# Patient Record
Sex: Female | Born: 1990 | Race: White | Hispanic: No | Marital: Single | Smoking: Former smoker
Health system: Southern US, Community
[De-identification: ages and names within clinical notes are randomized; demographics above are authoritative.]

## PROBLEM LIST (undated history)

## (undated) ENCOUNTER — Inpatient Hospital Stay (HOSPITAL_COMMUNITY): Payer: Self-pay

## (undated) DIAGNOSIS — I341 Nonrheumatic mitral (valve) prolapse: Secondary | ICD-10-CM

## (undated) DIAGNOSIS — K829 Disease of gallbladder, unspecified: Secondary | ICD-10-CM

## (undated) DIAGNOSIS — N83209 Unspecified ovarian cyst, unspecified side: Secondary | ICD-10-CM

## (undated) DIAGNOSIS — G43909 Migraine, unspecified, not intractable, without status migrainosus: Secondary | ICD-10-CM

## (undated) DIAGNOSIS — K219 Gastro-esophageal reflux disease without esophagitis: Secondary | ICD-10-CM

## (undated) DIAGNOSIS — M1990 Unspecified osteoarthritis, unspecified site: Secondary | ICD-10-CM

## (undated) DIAGNOSIS — I480 Paroxysmal atrial fibrillation: Secondary | ICD-10-CM

## (undated) DIAGNOSIS — O008 Other ectopic pregnancy without intrauterine pregnancy: Secondary | ICD-10-CM

## (undated) DIAGNOSIS — O99340 Other mental disorders complicating pregnancy, unspecified trimester: Secondary | ICD-10-CM

## (undated) DIAGNOSIS — F32A Depression, unspecified: Secondary | ICD-10-CM

## (undated) DIAGNOSIS — L299 Pruritus, unspecified: Secondary | ICD-10-CM

## (undated) DIAGNOSIS — O021 Missed abortion: Secondary | ICD-10-CM

## (undated) DIAGNOSIS — K811 Chronic cholecystitis: Secondary | ICD-10-CM

## (undated) DIAGNOSIS — Z01818 Encounter for other preprocedural examination: Secondary | ICD-10-CM

## (undated) DIAGNOSIS — N921 Excessive and frequent menstruation with irregular cycle: Secondary | ICD-10-CM

## (undated) DIAGNOSIS — N926 Irregular menstruation, unspecified: Secondary | ICD-10-CM

## (undated) DIAGNOSIS — K828 Other specified diseases of gallbladder: Principal | ICD-10-CM

## (undated) DIAGNOSIS — Z3A11 11 weeks gestation of pregnancy: Secondary | ICD-10-CM

## (undated) DIAGNOSIS — O00209 Unspecified ovarian pregnancy without intrauterine pregnancy: Secondary | ICD-10-CM

## (undated) DIAGNOSIS — I499 Cardiac arrhythmia, unspecified: Secondary | ICD-10-CM

## (undated) DIAGNOSIS — R102 Pelvic and perineal pain: Secondary | ICD-10-CM

## (undated) DIAGNOSIS — O99713 Diseases of the skin and subcutaneous tissue complicating pregnancy, third trimester: Secondary | ICD-10-CM

## (undated) DIAGNOSIS — R1032 Left lower quadrant pain: Secondary | ICD-10-CM

## (undated) DIAGNOSIS — O09292 Supervision of pregnancy with other poor reproductive or obstetric history, second trimester: Secondary | ICD-10-CM

## (undated) HISTORY — DX: Migraine, unspecified, not intractable, without status migrainosus: G43.909

## (undated) HISTORY — DX: Unspecified osteoarthritis, unspecified site: M19.90

## (undated) HISTORY — DX: Gastro-esophageal reflux disease without esophagitis: K21.9

## (undated) HISTORY — PX: CLOSED REDUCTION ELBOW DISLOCATION: SUR215

---
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
## 2012-01-13 ENCOUNTER — Inpatient Hospital Stay (HOSPITAL_COMMUNITY)
Admission: AD | Admit: 2012-01-13 | Discharge: 2012-01-13 | Disposition: A | Discharge status: Home / Self Care | Payer: Medicaid Other | Source: Ambulatory Visit | Attending: Obstetrics & Gynecology | Admitting: Obstetrics & Gynecology

## 2012-01-13 ENCOUNTER — Encounter (HOSPITAL_COMMUNITY): Payer: Self-pay | Admitting: *Deleted

## 2012-01-13 ENCOUNTER — Encounter (HOSPITAL_COMMUNITY)
Admission: RE | Admit: 2012-01-13 | Discharge: 2012-01-13 | Disposition: A | Discharge status: Home / Self Care | Payer: Medicaid Other | Source: Ambulatory Visit | Attending: Emergency Medicine | Admitting: Emergency Medicine

## 2012-01-13 DIAGNOSIS — R109 Unspecified abdominal pain: Secondary | ICD-10-CM | POA: Insufficient documentation

## 2012-01-13 DIAGNOSIS — N390 Urinary tract infection, site not specified: Secondary | ICD-10-CM | POA: Insufficient documentation

## 2012-01-13 DIAGNOSIS — R300 Dysuria: Secondary | ICD-10-CM | POA: Insufficient documentation

## 2012-01-13 LAB — URINE MICROSCOPIC-ADD ON

## 2012-01-13 LAB — URINALYSIS, ROUTINE W REFLEX MICROSCOPIC
Ketones, ur: NEGATIVE mg/dL
Nitrite: NEGATIVE
Specific Gravity, Urine: >1.03 — ABNORMAL HIGH (ref 1.005–1.030)
Urobilinogen, UA: 1 mg/dL (ref 0.0–1.0)

## 2012-01-13 LAB — WET PREP, GENITAL: WBC, Wet Prep HPF POC: NONE SEEN

## 2012-01-13 LAB — POCT PREGNANCY, URINE: Preg Test, Ur: NEGATIVE

## 2012-01-13 MED ORDER — PHENAZOPYRIDINE HCL 100 MG PO TABS
200.0000 mg | ORAL_TABLET | Freq: Once | ORAL | Status: AC
  Administered 2012-01-13: 200 mg via ORAL
  Filled 2012-01-13: qty 2

## 2012-01-13 MED ORDER — SINCALIDE 5 MCG IJ SOLR: 0.0400 ug/kg | Freq: Once | INTRAMUSCULAR | Status: DC

## 2012-01-13 MED ORDER — TECHNETIUM TC 99M MEBROFENIN IV KIT
5.0000 | PACK | Freq: Once | INTRAVENOUS | Status: AC | PRN
  Administered 2012-01-13: 5 via INTRAVENOUS

## 2012-01-13 MED ORDER — NITROFURANTOIN MONOHYD MACRO 100 MG PO CAPS
100.0000 mg | ORAL_CAPSULE | Freq: Once | ORAL | Status: AC
  Administered 2012-01-13: 100 mg via ORAL
  Filled 2012-01-13: qty 1

## 2012-01-13 MED ORDER — SINCALIDE 5 MCG IJ SOLR
INTRAMUSCULAR | Status: AC
  Administered 2012-01-13: 3.8 ug via INTRAVENOUS
  Filled 2012-01-13: qty 5

## 2012-01-13 MED ORDER — SINCALIDE 5 MCG IJ SOLR
INTRAMUSCULAR | Status: AC
  Filled 2012-01-13: qty 5

## 2012-01-13 MED ORDER — NITROFURANTOIN MONOHYD MACRO 100 MG PO CAPS: 100.0000 mg | ORAL_CAPSULE | Freq: Two times a day (BID) | ORAL | Status: AC

## 2012-01-13 NOTE — MAU Provider Note (Signed)
History     CSN: [id number]  Arrival date and time: 01/13/12 1628   First Provider Initiated Contact with Patient 01/13/12 1739      Chief Complaint  Patient presents with  . Dysuria   HPI Laurie [name] is 21 y.o. G0P0000 Unknown weeks presenting with frequency of urination with ? Blood in it.  She also is concerned she has an STD.  She is not pregnant    Past Medical History  Diagnosis Date  . Asthma   . Ovarian cyst   . MVP (mitral valve prolapse)     Past Surgical History  Procedure Date  . Appendectomy     Family History  Problem Relation Age of Onset  . Anesthesia problems Neg Hx   . Hypertension Maternal Aunt   . Hypertension Paternal Aunt   . Hypertension Maternal Grandmother   . Drug abuse Maternal Grandmother   . Hypertension Maternal Grandfather   . Drug abuse Maternal Grandfather   . Hypertension Paternal Grandmother   . Drug abuse Paternal Grandmother   . Hypertension Paternal Grandfather   . Drug abuse Paternal Grandfather     History  Substance Use Topics  . Smoking status: Never Smoker   . Smokeless tobacco: Never Used  . Alcohol Use: No    Allergies: No Known Allergies  Prescriptions prior to admission  Medication Sig Dispense Refill  . albuterol (PROVENTIL HFA;VENTOLIN HFA) 108 (90 BASE) MCG/ACT inhaler Inhale 2 puffs into the lungs every 6 (six) hours as needed. Shortness of breath/asthma       . phentermine 37.5 MG capsule Take 37.5 mg by mouth every morning.        Review of Systems  Constitutional: Negative.   Gastrointestinal: Positive for abdominal pain (lower mid abdominal discomfort). Negative for nausea and vomiting.  Genitourinary: Positive for dysuria, frequency and hematuria.       + for vaginal discharge   Physical Exam   Blood pressure 113/70, pulse 91, temperature 97.1 F (36.2 C), temperature source Oral, resp. rate 18, height 5' 6.5" (1.689 m), weight 78.382 kg (172 lb 12.8 oz), last menstrual period 01/04/2012,  SpO2 100.00%.  Physical Exam  Constitutional: She is oriented to person, place, and time. She appears well-developed and well-nourished. No distress.  HENT:  Head: Normocephalic.  Neck: Normal range of motion.  Cardiovascular: Normal rate.   Respiratory: Effort normal.  GI: Soft. There is tenderness (mild tenderness -suprapubic).  Genitourinary: Uterus is not enlarged and not tender. Cervix exhibits no discharge and no friability. Right adnexum displays no mass, no tenderness and no fullness. Left adnexum displays no mass, no tenderness and no fullness. No bleeding around the vagina. Vaginal discharge (small amount of creamy discharge without odor) found.  Neurological: She is alert and oriented to person, place, and time.  Skin: Skin is warm and dry.  Psychiatric: She has a normal mood and affect. Her behavior is normal.   Results for orders placed during the hospital encounter of 01/13/12 (from the past 24 hour(s))  URINALYSIS, ROUTINE W REFLEX MICROSCOPIC     Status: Abnormal   Collection Time   01/13/12  4:50 PM      Component Value Range   Color, Urine YELLOW  YELLOW    APPearance CLOUDY (*) CLEAR    Specific Gravity, Urine >1.030 (*) 1.005 - 1.030    pH 5.5  5.0 - 8.0    Glucose, UA NEGATIVE  NEGATIVE (mg/dL)   Hgb urine dipstick LARGE (*) NEGATIVE  Bilirubin Urine SMALL (*) NEGATIVE    Ketones, ur NEGATIVE  NEGATIVE (mg/dL)   Protein, ur 829 (*) NEGATIVE (mg/dL)   Urobilinogen, UA 1.0  0.0 - 1.0 (mg/dL)   Nitrite NEGATIVE  NEGATIVE    Leukocytes, UA SMALL (*) NEGATIVE   URINE MICROSCOPIC-ADD ON     Status: Abnormal   Collection Time   01/13/12  4:50 PM      Component Value Range   Squamous Epithelial / LPF MANY (*) RARE    WBC, UA TOO NUMEROUS TO COUNT  <3 (WBC/hpf)   RBC / HPF 21-50  <3 (RBC/hpf)  POCT PREGNANCY, URINE     Status: Normal   Collection Time   01/13/12  4:57 PM      Component Value Range   Preg Test, Ur NEGATIVE  NEGATIVE   WET PREP, GENITAL     Status:  Normal   Collection Time   01/13/12  5:45 PM      Component Value Range   Yeast Wet Prep HPF POC NONE SEEN  NONE SEEN    Trich, Wet Prep NONE SEEN  NONE SEEN    Clue Cells Wet Prep HPF POC NONE SEEN  NONE SEEN    WBC, Wet Prep HPF POC NONE SEEN  NONE SEEN     PHONE CALL FROM LAB WITH CORRECTED WET PREP WITH CLUES-FEW MAU Course  Procedures GC/CHl culture to lab  MDM Pyridium 200mg  and Macrobid 100mg  po given in MAU  Assessment and Plan  A: Urinary Tract Infection     Screen for STI  P: Rx for Macrobid     Instructed to by Azo over the counter for bladder discomfort     Increase po fluids.  Keiandre Cygan,EVE M 01/13/2012, 5:47 PM

## 2012-01-13 NOTE — Discharge Instructions (Signed)
Urinary Tract Infection A urinary tract infection (UTI) is often caused by a germ (bacteria). A UTI is usually helped with medicine (antibiotics) that kills germs. Take all the medicine until it is gone. Do this even if you are feeling better. You are usually better in 7 to 10 days. HOME CARE   Drink enough water and fluids to keep your pee (urine) clear or pale yellow. Drink:   Cranberry juice.   Water.   Avoid:   Caffeine.   Tea.   Bubbly (carbonated) drinks.   Alcohol.   Only take medicine as told by your doctor.   To prevent further infections:   Pee often.   After pooping (bowel movement), women should wipe from front to back. Use each tissue only once.   Pee before and after having sex (intercourse).  Ask your doctor when your test results will be ready. Make sure you follow up and get your test results.  GET HELP RIGHT AWAY IF:   There is very bad back pain or lower belly (abdominal) pain.   You get the chills.   You have a fever.   Your baby is older than 3 months with a rectal temperature of 102 F (38.9 C) or higher.   Your baby is 3 months old or younger with a rectal temperature of 100.4 F (38 C) or higher.   You feel sick to your stomach (nauseous) or throw up (vomit).   There is continued burning with peeing.   Your problems are not better in 3 days. Return sooner if you are getting worse.  MAKE SURE YOU:   Understand these instructions.   Will watch your condition.   Will get help right away if you are not doing well or get worse.  Document Released: 02/09/2008 Document Revised: 08/12/2011 Document Reviewed: 02/09/2008 [employer] Patient Information 2012 [employer]. 

## 2012-01-13 NOTE — MAU Provider Note (Signed)
Medical Screening exam and patient care preformed by advanced practice provider.  Agree with the above management.  

## 2012-01-13 NOTE — MAU Note (Signed)
Patient states she has a history of recurrent bladder injections. Has been having urinary frequency with only a little urine out and some abdominal pain. States her partner has had other partners and wants to be checked for STD's.

## 2012-01-13 NOTE — MAU Note (Signed)
Worried about having some type of STD;

## 2012-01-14 LAB — GC/CHLAMYDIA PROBE AMP, GENITAL
Chlamydia, DNA Probe: NEGATIVE
GC Probe Amp, Genital: NEGATIVE

## 2012-01-24 ENCOUNTER — Emergency Department (HOSPITAL_COMMUNITY)
Admission: EM | Admit: 2012-01-24 | Discharge: 2012-01-25 | Disposition: A | Discharge status: Home / Self Care | Payer: Medicaid Other | Attending: Emergency Medicine | Admitting: Emergency Medicine

## 2012-01-24 ENCOUNTER — Encounter (HOSPITAL_COMMUNITY): Payer: Self-pay | Admitting: Emergency Medicine

## 2012-01-24 DIAGNOSIS — K5900 Constipation, unspecified: Secondary | ICD-10-CM | POA: Insufficient documentation

## 2012-01-24 DIAGNOSIS — R109 Unspecified abdominal pain: Secondary | ICD-10-CM | POA: Insufficient documentation

## 2012-01-24 DIAGNOSIS — R5381 Other malaise: Secondary | ICD-10-CM | POA: Insufficient documentation

## 2012-01-24 DIAGNOSIS — R112 Nausea with vomiting, unspecified: Secondary | ICD-10-CM | POA: Insufficient documentation

## 2012-01-24 DIAGNOSIS — J45909 Unspecified asthma, uncomplicated: Secondary | ICD-10-CM | POA: Insufficient documentation

## 2012-01-24 DIAGNOSIS — R10819 Abdominal tenderness, unspecified site: Secondary | ICD-10-CM | POA: Insufficient documentation

## 2012-01-24 DIAGNOSIS — R5383 Other fatigue: Secondary | ICD-10-CM | POA: Insufficient documentation

## 2012-01-24 LAB — URINE MICROSCOPIC-ADD ON

## 2012-01-24 LAB — LIPASE, BLOOD: Lipase: 20 U/L (ref 11–59)

## 2012-01-24 LAB — DIFFERENTIAL
Basophils Absolute: 0 10*3/uL (ref 0.0–0.1)
Basophils Relative: 0 % (ref 0–1)
Eosinophils Absolute: 0.1 10*3/uL (ref 0.0–0.7)
Monocytes Absolute: 0.3 10*3/uL (ref 0.1–1.0)
Neutro Abs: 5.1 10*3/uL (ref 1.7–7.7)
Neutrophils Relative %: 67 % (ref 43–77)

## 2012-01-24 LAB — COMPREHENSIVE METABOLIC PANEL
AST: 14 U/L (ref 0–37)
Albumin: 4.3 g/dL (ref 3.5–5.2)
Chloride: 101 mEq/L (ref 96–112)
Creatinine, Ser: 0.91 mg/dL (ref 0.50–1.10)
Potassium: 3.6 mEq/L (ref 3.5–5.1)
Total Bilirubin: 0.2 mg/dL — ABNORMAL LOW (ref 0.3–1.2)
Total Protein: 7.6 g/dL (ref 6.0–8.3)

## 2012-01-24 LAB — URINALYSIS, ROUTINE W REFLEX MICROSCOPIC
Ketones, ur: NEGATIVE mg/dL
Nitrite: NEGATIVE
Protein, ur: NEGATIVE mg/dL
Urobilinogen, UA: 1 mg/dL (ref 0.0–1.0)

## 2012-01-24 LAB — CBC
MCHC: 35 g/dL (ref 30.0–36.0)
RDW: 12 % (ref 11.5–15.5)

## 2012-01-24 MED ORDER — ONDANSETRON HCL 4 MG/2ML IJ SOLN
4.0000 mg | Freq: Once | INTRAMUSCULAR | Status: AC
  Administered 2012-01-24: 4 mg via INTRAVENOUS
  Filled 2012-01-24: qty 2

## 2012-01-24 MED ORDER — ONDANSETRON 8 MG PO TBDP: 8.0000 mg | ORAL_TABLET | Freq: Three times a day (TID) | ORAL | Status: AC | PRN

## 2012-01-24 MED ORDER — HYDROMORPHONE HCL PF 1 MG/ML IJ SOLN
1.0000 mg | Freq: Once | INTRAMUSCULAR | Status: AC
  Administered 2012-01-24: 1 mg via INTRAVENOUS

## 2012-01-24 MED ORDER — HYDROMORPHONE HCL PF 1 MG/ML IJ SOLN
1.0000 mg | Freq: Once | INTRAMUSCULAR | Status: AC
  Administered 2012-01-24: 1 mg via INTRAVENOUS
  Filled 2012-01-24: qty 1

## 2012-01-24 MED ORDER — OXYCODONE-ACETAMINOPHEN 5-325 MG PO TABS: 1.0000 | ORAL_TABLET | Freq: Four times a day (QID) | ORAL | Status: AC | PRN

## 2012-01-24 MED ORDER — SODIUM CHLORIDE 0.9 % IV BOLUS (SEPSIS)
1000.0000 mL | Freq: Once | INTRAVENOUS | Status: AC
  Administered 2012-01-24: 1000 mL via INTRAVENOUS

## 2012-01-24 MED ORDER — HYDROMORPHONE HCL PF 1 MG/ML IJ SOLN
INTRAMUSCULAR | Status: AC
  Administered 2012-01-24: 1 mg via INTRAVENOUS
  Filled 2012-01-24: qty 1

## 2012-01-24 NOTE — Discharge Instructions (Signed)
Please read and follow all provided instructions.  Your diagnoses today include:  1. Abdominal pain     Tests performed today include:  Blood counts and electrolytes  Blood tests to check liver and kidney function  Blood tests to check pancreas function  Urine test to look for infection and pregnancy (in women)  Vital signs. See below for your results today.   Medications prescribed:   Percocet (oxycodone/acetaminophen) - narcotic pain medication  You have been prescribed narcotic pain medication such as Vicodin or Percocet: DO NOT drive or perform any activities that require you to be awake and alert because this medicine can make you drowsy. BE VERY CAREFUL not to take multiple medicines containing Tylenol (also called acetaminophen). Doing so can lead to an overdose which can damage your liver and cause liver failure and possibly death.    Zofran (ondansetron) - for nausea and vomiting  Take any prescribed medications only as directed.  Home care instructions:   Follow any educational materials contained in this packet.  Follow-up instructions: Please follow-up with your primary care provider in the next 2 days for further evaluation of your symptoms. If you do not have a primary care doctor -- see below for referral information.   Call the stomach doctor referral and get an appointment to be seen for your symptoms.   Return instructions:  SEEK IMMEDIATE MEDICAL ATTENTION IF:  The pain does not go away or becomes severe   A temperature above 101F develops   Repeated vomiting occurs (multiple episodes)   The pain becomes localized to portions of the abdomen. The right side could possibly be appendicitis. In an adult, the left lower portion of the abdomen could be colitis or diverticulitis.   Blood is being passed in stools or vomit (bright red or black tarry stools)   You develop chest pain, difficulty breathing, dizziness or fainting, or become confused, poorly  responsive, or inconsolable (young children)  If you have any other emergent concerns regarding your health  Additional Information: Abdominal (belly) pain can be caused by many things. Your caregiver performed an examination and possibly ordered blood/urine tests and imaging (CT scan, x-rays, ultrasound). Many cases can be observed and treated at home after initial evaluation in the emergency department. Even though you are being discharged home, abdominal pain can be unpredictable. Therefore, you need a repeated exam if your pain does not resolve, returns, or worsens. Most patients with abdominal pain don't have to be admitted to the hospital or have surgery, but serious problems like appendicitis and gallbladder attacks can start out as nonspecific pain. Many abdominal conditions cannot be diagnosed in one visit, so follow-up evaluations are very important.  Your vital signs today were: BP 110/62  Pulse 76  Temp(Src) 98 F (36.7 C) (Oral)  Resp 16  Wt 164 lb 4 oz (74.503 kg)  SpO2 100%  LMP 01/20/2012 If your blood pressure (bp) was elevated above 135/85 this visit, please have this repeated by your doctor within one month. -------------- No Primary Care Doctor Call [facility]  [phone] Other agencies that provide inexpensive medical care    [facility]  [phone]    [facility]  [phone]    [facility]  [phone]    [facility]  [phone]    [facility]  [phone]    [facility]  [phone] -------------- RESOURCE GUIDE:  Dental Problems  Patients with Medicaid: [facility]  [facility] [address]                                            [phone] [address] [employer] Phone:  [phone]                                                      Phone:  [phone]  If unable to pay or uninsured, contact:  [facility] or [facility]. to become qualified for the adult  dental clinic.  Chronic Pain Problems Contact [facility]  [phone] Patients need to be referred by their primary care doctor.  Insufficient Money for Medicine Contact [employer]:  call "211" or [facility] [phone].  Psychological Services [facility]  [phone] [facility]  [phone] [facility]   [phone] (emergency services [phone])  Substance Abuse [facility]  [phone] [facility] [phone] [facility] [phone] Floydene Flock [phone] Residential & Outpatient Substance Abuse Program  [phone]  Abuse/Neglect [facility] Child Abuse Hotline [phone] [facility] Child Abuse Hotline [phone] (After Hours)  Emergency Shelter [facility] [phone]  Maternity Homes [facility] at the [place] [facility] [phone] [facility] [phone]  [facility]  [facility]     [employer]                          [facility] [address] [place]                       [address]      [address]  Blondell Reveal Phone:  [phone]                                   Phone:  [phone]                 Phone:  [phone]  [facility] Phone:  [phone]  [facility] Child Abuse Hotline [phone] [phone] (After Hours)

## 2012-01-24 NOTE — ED Notes (Signed)
For two months, has had abd pain, and hasn't been able to "Keep anything down" said was seen at [facility] for same and was told had "sludge" in gallbladder.

## 2012-01-24 NOTE — ED Provider Notes (Signed)
History     CSN: [id number]  Arrival date & time 01/24/12  1552   First MD Initiated Contact with Patient 01/24/12 2004      Chief Complaint  Patient presents with  . Abdominal Pain  . Weakness    (Consider location/radiation/quality/duration/timing/severity/associated sxs/prior treatment) HPI Comments: Patient presents with ongoing upper right quadrant abdominal pain and vomiting for 2 months. It has been worse in the past several days. Patient has had an ultrasound which showed sludge and a followup scan which was normal. Patient has been using Zofran and Phenergan at home without relief. She uses Tylenol for pain without much relief. Pain is sharp and intermittent. It is associated with vomiting. Pain does not radiate. Nothing makes the symptoms better. Palpation makes the symptoms worse. She denies fever, upper respiratory tract infection symptoms, chest pain, shortness of breath, urinary symptoms. She states that she has been constipated and is using stool softeners for this.  Patient is a 21 y.o. female presenting with abdominal pain and weakness. The history is provided by the patient and a parent.  Abdominal Pain The primary symptoms of the illness include abdominal pain, fatigue, nausea and vomiting. The primary symptoms of the illness do not include fever, shortness of breath, diarrhea, dysuria, vaginal discharge or vaginal bleeding. The current episode started more than 2 days ago. The problem has not changed since onset. The patient states that she believes she is currently not pregnant. Additional symptoms associated with the illness include constipation. Symptoms associated with the illness do not include heartburn, hematuria, frequency or back pain.  Weakness The primary symptoms include nausea and vomiting. Primary symptoms do not include headaches or fever.    Past Medical History  Diagnosis Date  . Asthma   . Ovarian cyst   . MVP (mitral valve prolapse)     Past  Surgical History  Procedure Date  . Appendectomy     Family History  Problem Relation Age of Onset  . Anesthesia problems Neg Hx   . Hypertension Maternal Aunt   . Hypertension Paternal Aunt   . Hypertension Maternal Grandmother   . Drug abuse Maternal Grandmother   . Hypertension Maternal Grandfather   . Drug abuse Maternal Grandfather   . Hypertension Paternal Grandmother   . Drug abuse Paternal Grandmother   . Hypertension Paternal Grandfather   . Drug abuse Paternal Grandfather     History  Substance Use Topics  . Smoking status: Never Smoker   . Smokeless tobacco: Never Used  . Alcohol Use: No    OB History    Grav Para Term Preterm Abortions TAB SAB Ect Mult Living   0 0 0 0 0 0 0 0 0 0       Review of Systems  Constitutional: Positive for fatigue. Negative for fever.  HENT: Negative for sore throat and rhinorrhea.   Eyes: Negative for redness.  Respiratory: Negative for cough and shortness of breath.   Cardiovascular: Negative for chest pain.  Gastrointestinal: Positive for nausea, vomiting, abdominal pain and constipation. Negative for heartburn, diarrhea and blood in stool.  Genitourinary: Negative for dysuria, frequency, hematuria, vaginal bleeding and vaginal discharge.  Musculoskeletal: Negative for myalgias and back pain.  Skin: Negative for rash.  Neurological: Negative for headaches.    Allergies  Review of patient's allergies indicates no known allergies.  Home Medications   Current Outpatient Rx  Name Route Sig Dispense Refill  . ACETAMINOPHEN 500 MG PO TABS Oral Take 500 mg by mouth every 6 (  six) hours as needed. Pain    . PHENTERMINE HCL 37.5 MG PO CAPS Oral Take 37.5 mg by mouth every morning.    Marland Kitchen NITROFURANTOIN MONOHYD MACRO 100 MG PO CAPS Oral Take 1 capsule (100 mg total) by mouth 2 (two) times daily. 14 capsule 0    BP 110/62  Pulse 76  Temp(Src) 98 F (36.7 C) (Oral)  Resp 16  Wt 164 lb 4 oz (74.503 kg)  SpO2 100%  LMP  01/20/2012  Physical Exam  Nursing note and vitals reviewed. Constitutional: She is oriented to person, place, and time. She appears well-developed and well-nourished.  HENT:  Head: Normocephalic and atraumatic.  Eyes: Conjunctivae are normal. Right eye exhibits no discharge. Left eye exhibits no discharge.  Neck: Normal range of motion. Neck supple.  Cardiovascular: Normal rate, regular rhythm and normal heart sounds.   Pulmonary/Chest: Effort normal and breath sounds normal.  Abdominal: Soft. There is tenderness in the right upper quadrant and epigastric area. There is no rigidity, no rebound, no guarding, no CVA tenderness, no tenderness at McBurney's point and negative Murphy's sign.    Neurological: She is alert and oriented to person, place, and time.  Skin: Skin is warm and dry.  Psychiatric: She has a normal mood and affect.    ED Course  Procedures (including critical care time)  Labs Reviewed  URINALYSIS, ROUTINE W REFLEX MICROSCOPIC - Abnormal; Notable for the following:    APPearance CLOUDY (*)    Leukocytes, UA MODERATE (*)    All other components within normal limits  COMPREHENSIVE METABOLIC PANEL - Abnormal; Notable for the following:    Glucose, Bld 119 (*)    Total Bilirubin 0.2 (*)    All other components within normal limits  URINE MICROSCOPIC-ADD ON - Abnormal; Notable for the following:    Bacteria, UA MANY (*)    All other components within normal limits  CBC  DIFFERENTIAL  POCT PREGNANCY, URINE  URINE CULTURE  LIPASE, BLOOD  LAB REPORT - SCANNED   No results found.   1. Abdominal pain     8:32 PM Patient seen and examined. Work-up reviewed. Lipase ordered. Discussed results with patient and mother. Medications ordered. Patient will need GI referral.   Vital signs reviewed and are as follows: Filed Vitals:   01/24/12 1943  BP: 110/62  Pulse: 76  Temp: 98 F (36.7 C)  Resp: 16   9:46 PM Pain medicine helped, requesting another dose.  Patient tolerating fluids in room.   The patient was urged to return to the Emergency Department immediately with worsening of current symptoms, worsening abdominal pain, persistent vomiting, blood noted in stools, fever, or any other concerns. The patient verbalized understanding.   Patient counseled on use of narcotic pain medications. Counseled not to combine these medications with others containing tylenol. Urged not to drink alcohol, drive, or perform any other activities that requires focus while taking these medications. The patient verbalizes understanding and agrees with the plan.  MDM  Allergies reassuring. At this point I do not think there is any need for repeat ultrasound. I do not suspect cholecystitis. Will treat symptoms tonight. Will give GI referral for further evaluation of ongoing symptoms.        [place], [place] 01/27/12 [phone]

## 2012-01-25 LAB — URINE CULTURE: Colony Count: 6000

## 2012-01-28 NOTE — ED Provider Notes (Signed)
Medical screening examination/treatment/procedure(s) were performed by non-physician practitioner and as supervising physician I was immediately available for consultation/collaboration.   Kiari Hosmer R Imari Sivertsen, MD 01/28/12 1558 

## 2012-02-02 ENCOUNTER — Other Ambulatory Visit: Payer: Self-pay | Admitting: Gastroenterology

## 2012-04-10 ENCOUNTER — Emergency Department (HOSPITAL_COMMUNITY)
Admission: EM | Admit: 2012-04-10 | Discharge: 2012-04-11 | Disposition: A | Discharge status: Home / Self Care | Payer: Medicaid Other | Attending: Emergency Medicine | Admitting: Emergency Medicine

## 2012-04-10 ENCOUNTER — Emergency Department (HOSPITAL_COMMUNITY): Payer: Medicaid Other

## 2012-04-10 ENCOUNTER — Encounter (HOSPITAL_COMMUNITY): Payer: Self-pay | Admitting: *Deleted

## 2012-04-10 DIAGNOSIS — R109 Unspecified abdominal pain: Secondary | ICD-10-CM

## 2012-04-10 DIAGNOSIS — R1011 Right upper quadrant pain: Secondary | ICD-10-CM | POA: Insufficient documentation

## 2012-04-10 DIAGNOSIS — R112 Nausea with vomiting, unspecified: Secondary | ICD-10-CM | POA: Insufficient documentation

## 2012-04-10 HISTORY — DX: Disease of gallbladder, unspecified: K82.9

## 2012-04-10 LAB — URINALYSIS, ROUTINE W REFLEX MICROSCOPIC
Bilirubin Urine: NEGATIVE
Glucose, UA: NEGATIVE mg/dL
Hgb urine dipstick: NEGATIVE
Specific Gravity, Urine: 1.006 (ref 1.005–1.030)
Urobilinogen, UA: 0.2 mg/dL (ref 0.0–1.0)
pH: 6.5 (ref 5.0–8.0)

## 2012-04-10 LAB — COMPREHENSIVE METABOLIC PANEL
ALT: 14 U/L (ref 0–35)
AST: 16 U/L (ref 0–37)
Albumin: 4.1 g/dL (ref 3.5–5.2)
Alkaline Phosphatase: 54 U/L (ref 39–117)
Calcium: 9.6 mg/dL (ref 8.4–10.5)
GFR calc Af Amer: >90 mL/min (ref >90)
Glucose, Bld: 110 mg/dL — ABNORMAL HIGH (ref 70–99)
Potassium: 3.9 mEq/L (ref 3.5–5.1)
Sodium: 140 mEq/L (ref 135–145)
Total Protein: 7.9 g/dL (ref 6.0–8.3)

## 2012-04-10 LAB — POCT PREGNANCY, URINE: Preg Test, Ur: NEGATIVE

## 2012-04-10 LAB — CBC
Hemoglobin: 13.1 g/dL (ref 12.0–15.0)
MCH: 31.8 pg (ref 26.0–34.0)
MCHC: 35 g/dL (ref 30.0–36.0)
Platelets: 267 10*3/uL (ref 150–400)

## 2012-04-10 MED ORDER — HYDROMORPHONE HCL PF 1 MG/ML IJ SOLN
1.0000 mg | Freq: Once | INTRAMUSCULAR | Status: AC
  Administered 2012-04-10: 1 mg via INTRAVENOUS
  Filled 2012-04-10: qty 1

## 2012-04-10 MED ORDER — ONDANSETRON 4 MG PO TBDP
8.0000 mg | ORAL_TABLET | Freq: Once | ORAL | Status: AC
  Administered 2012-04-10: 8 mg via ORAL
  Filled 2012-04-10: qty 2

## 2012-04-10 NOTE — ED Provider Notes (Signed)
History     CSN: [id number]  Arrival date & time 04/10/12  1927   First MD Initiated Contact with Patient 04/10/12 2209      Chief Complaint  Patient presents with  . Abdominal Pain  . Emesis    (Consider location/radiation/quality/duration/timing/severity/associated sxs/prior treatment) Patient is a 21 y.o. female presenting with abdominal pain and vomiting. The history is provided by the patient. No language interpreter was used.  Abdominal Pain The primary symptoms of the illness include abdominal pain, nausea and vomiting. The primary symptoms of the illness do not include fever, shortness of breath, diarrhea, dysuria, vaginal discharge or vaginal bleeding. The current episode started more than 2 days ago. The onset of the illness was gradual. The problem has been gradually worsening.  The patient states that she believes she is currently not pregnant. The patient has not had a change in bowel habit. Symptoms associated with the illness do not include constipation, urgency, hematuria, frequency or back pain. Significant associated medical issues do not include liver disease.  Emesis  Associated symptoms include abdominal pain. Pertinent negatives include no diarrhea and no fever.    21 yo female with recurrent worsening RUQ pain x 5 days with nausea and vomiting.  States that the pain is worse after eating but constant 8/10. Was here in may and had a hiata scan that showed gallbladder sludge but other wise normal.  States she has vomited > 10 times in the last 24 hours.  pmh asthma, ovarian cyst, mvp, gall bladder dz and appendectomy.  Does not smoke.  Does not take birthcontrol. Here with her aunt today.   Last bm 3 days ago was normal.  Last period July 23.   pmh asthma, ovarian cyst, mvp,   Past Medical History  Diagnosis Date  . Asthma   . Ovarian cyst   . MVP (mitral valve prolapse)   . Gall bladder disease     Past Surgical History  Procedure Date  . Appendectomy      Family History  Problem Relation Age of Onset  . Anesthesia problems Neg Hx   . Hypertension Maternal Aunt   . Hypertension Paternal Aunt   . Hypertension Maternal Grandmother   . Drug abuse Maternal Grandmother   . Hypertension Maternal Grandfather   . Drug abuse Maternal Grandfather   . Hypertension Paternal Grandmother   . Drug abuse Paternal Grandmother   . Hypertension Paternal Grandfather   . Drug abuse Paternal Grandfather     History  Substance Use Topics  . Smoking status: Never Smoker   . Smokeless tobacco: Never Used  . Alcohol Use: No    OB History    Grav Para Term Preterm Abortions TAB SAB Ect Mult Living   0 0 0 0 0 0 0 0 0 0       Review of Systems  Constitutional: Negative.  Negative for fever.  HENT: Negative.   Eyes: Negative.   Respiratory: Negative.  Negative for shortness of breath.   Cardiovascular: Negative.   Gastrointestinal: Positive for nausea, vomiting and abdominal pain. Negative for diarrhea and constipation.  Genitourinary: Negative for dysuria, urgency, frequency, hematuria, vaginal bleeding and vaginal discharge.  Musculoskeletal: Negative for back pain.  Neurological: Negative.   Psychiatric/Behavioral: Negative.   All other systems reviewed and are negative.    Allergies  Review of patient's allergies indicates no known allergies.  Home Medications  No current outpatient prescriptions on file.  BP 128/72  Pulse 72  Temp 97.6 F (  36.4 C) (Oral)  Resp 20  SpO2 100%  Physical Exam  Nursing note and vitals reviewed. Constitutional: She is oriented to person, place, and time. She appears well-developed and well-nourished.  HENT:  Head: Normocephalic and atraumatic.  Eyes: Conjunctivae and EOM are normal. Pupils are equal, round, and reactive to light.  Neck: Normal range of motion. Neck supple.  Cardiovascular: Normal rate.   Pulmonary/Chest: Effort normal and breath sounds normal. No respiratory distress.   Abdominal: Soft. Bowel sounds are normal. She exhibits no distension and no mass. There is tenderness. There is no rebound and no guarding.       RUQ pain  Musculoskeletal: Normal range of motion. She exhibits no edema and no tenderness.  Neurological: She is alert and oriented to person, place, and time. She has normal reflexes.  Skin: Skin is warm and dry.  Psychiatric: She has a normal mood and affect.    ED Course  Procedures (including critical care time)  Labs Reviewed  COMPREHENSIVE METABOLIC PANEL - Abnormal; Notable for the following:    Glucose, Bld 110 (*)     Total Bilirubin 0.2 (*)     All other components within normal limits  CBC  LIPASE, BLOOD  URINALYSIS, ROUTINE W REFLEX MICROSCOPIC  POCT PREGNANCY, URINE   No results found.   No diagnosis found.    MDM  21 yo with recurrent RUQ pain with n/v.  Prior studies show gallbladder sludge.  U/s today shows no acute cholecystitis.  Labs today unremarkable.  No fever n/v in the ER today.  Will follow up with pcp in [place].  rx for pain zofran and pain meds.  Pain 2/10 after dilaudid in the ER.    Labs Reviewed  COMPREHENSIVE METABOLIC PANEL - Abnormal; Notable for the following:    Glucose, Bld 110 (*)     Total Bilirubin 0.2 (*)     All other components within normal limits  CBC  LIPASE, BLOOD  URINALYSIS, ROUTINE W REFLEX MICROSCOPIC  POCT PREGNANCY, URINE  LAB REPORT - SCANNED          Remi Haggard, NP 04/12/12 [phone]

## 2012-04-10 NOTE — ED Notes (Signed)
Pt with hx of gall bladder "sludge" to ED c/o RUQ pain and emesis x 1 week and emesis x 3 days.  Today the pain and emesis became unbearable so she came to ED.

## 2012-04-10 NOTE — ED Notes (Signed)
Updated on wait time.  

## 2012-04-11 MED ORDER — IBUPROFEN 800 MG PO TABS: 800.0000 mg | ORAL_TABLET | Freq: Three times a day (TID) | ORAL | Status: AC

## 2012-04-11 MED ORDER — OXYCODONE-ACETAMINOPHEN 5-325 MG PO TABS: 1.0000 | ORAL_TABLET | ORAL | Status: AC | PRN

## 2012-04-11 MED ORDER — ONDANSETRON HCL 4 MG PO TABS: 4.0000 mg | ORAL_TABLET | Freq: Four times a day (QID) | ORAL | Status: AC

## 2012-04-11 MED ORDER — HYDROMORPHONE HCL PF 1 MG/ML IJ SOLN
1.0000 mg | Freq: Once | INTRAMUSCULAR | Status: AC
  Administered 2012-04-11: 1 mg via INTRAVENOUS
  Filled 2012-04-11: qty 1

## 2012-04-11 NOTE — ED Notes (Signed)
A. CRAWFORD NP AT BEDSIDE SPEAKING WITH PT. AND FAMILY ON TEST RESULTS AND PLAN OF CARE .

## 2012-04-12 NOTE — ED Provider Notes (Signed)
Medical screening examination/treatment/procedure(s) were performed by non-physician practitioner and as supervising physician I was immediately available for consultation/collaboration.   Lyanne Co, MD 04/12/12 [phone]

## 2012-04-18 ENCOUNTER — Encounter (HOSPITAL_COMMUNITY): Payer: Self-pay | Admitting: *Deleted

## 2012-04-18 DIAGNOSIS — R109 Unspecified abdominal pain: Secondary | ICD-10-CM | POA: Insufficient documentation

## 2012-04-18 LAB — URINALYSIS, ROUTINE W REFLEX MICROSCOPIC
Glucose, UA: NEGATIVE mg/dL
Ketones, ur: NEGATIVE mg/dL
Leukocytes, UA: NEGATIVE
Nitrite: NEGATIVE
Specific Gravity, Urine: 1.007 (ref 1.005–1.030)
pH: 8 (ref 5.0–8.0)

## 2012-04-18 LAB — CBC WITH DIFFERENTIAL/PLATELET
Basophils Relative: 1 % (ref 0–1)
Eosinophils Absolute: 0.5 10*3/uL (ref 0.0–0.7)
Hemoglobin: 13.2 g/dL (ref 12.0–15.0)
Lymphs Abs: 2 10*3/uL (ref 0.7–4.0)
MCH: 31.4 pg (ref 26.0–34.0)
MCHC: 34.8 g/dL (ref 30.0–36.0)
Monocytes Relative: 4 % (ref 3–12)
Neutro Abs: 6.6 10*3/uL (ref 1.7–7.7)
Neutrophils Relative %: 70 % (ref 43–77)
Platelets: 274 10*3/uL (ref 150–400)
RBC: 4.2 MIL/uL (ref 3.87–5.11)

## 2012-04-18 LAB — COMPREHENSIVE METABOLIC PANEL
ALT: 12 U/L (ref 0–35)
Albumin: 4.2 g/dL (ref 3.5–5.2)
Alkaline Phosphatase: 51 U/L (ref 39–117)
Chloride: 103 mEq/L (ref 96–112)
Potassium: 4.2 mEq/L (ref 3.5–5.1)
Sodium: 143 mEq/L (ref 135–145)
Total Bilirubin: 0.3 mg/dL (ref 0.3–1.2)
Total Protein: 8.4 g/dL — ABNORMAL HIGH (ref 6.0–8.3)

## 2012-04-18 LAB — PREGNANCY, URINE: Preg Test, Ur: NEGATIVE

## 2012-04-18 NOTE — ED Notes (Signed)
Pt c/o rt lat abd pain for one week with n v no diarrhea.  lmp now

## 2012-04-19 ENCOUNTER — Emergency Department (HOSPITAL_COMMUNITY)
Admission: EM | Admit: 2012-04-19 | Discharge: 2012-04-19 | Discharge status: Against Medical Advice | Payer: Medicaid Other | Attending: Emergency Medicine | Admitting: Emergency Medicine

## 2012-04-19 NOTE — ED Notes (Signed)
Called pt for room, no answer

## 2012-06-06 ENCOUNTER — Emergency Department (HOSPITAL_COMMUNITY)
Admission: EM | Admit: 2012-06-06 | Discharge: 2012-06-07 | Disposition: A | Discharge status: Home / Self Care | Payer: Self-pay | Attending: Emergency Medicine | Admitting: Emergency Medicine

## 2012-06-06 ENCOUNTER — Encounter (HOSPITAL_COMMUNITY): Payer: Self-pay | Admitting: Emergency Medicine

## 2012-06-06 ENCOUNTER — Emergency Department (HOSPITAL_COMMUNITY)
Admission: EM | Admit: 2012-06-06 | Discharge: 2012-06-06 | Disposition: A | Discharge status: Other Acute Inpatient Hospital | Payer: Self-pay | Attending: Emergency Medicine | Admitting: Emergency Medicine

## 2012-06-06 ENCOUNTER — Encounter (HOSPITAL_COMMUNITY): Payer: Self-pay | Admitting: *Deleted

## 2012-06-06 DIAGNOSIS — J039 Acute tonsillitis, unspecified: Secondary | ICD-10-CM

## 2012-06-06 DIAGNOSIS — J45909 Unspecified asthma, uncomplicated: Secondary | ICD-10-CM | POA: Insufficient documentation

## 2012-06-06 DIAGNOSIS — K8050 Calculus of bile duct without cholangitis or cholecystitis without obstruction: Secondary | ICD-10-CM

## 2012-06-06 DIAGNOSIS — R05 Cough: Secondary | ICD-10-CM | POA: Insufficient documentation

## 2012-06-06 DIAGNOSIS — I059 Rheumatic mitral valve disease, unspecified: Secondary | ICD-10-CM | POA: Insufficient documentation

## 2012-06-06 DIAGNOSIS — R059 Cough, unspecified: Secondary | ICD-10-CM | POA: Insufficient documentation

## 2012-06-06 DIAGNOSIS — K8020 Calculus of gallbladder without cholecystitis without obstruction: Secondary | ICD-10-CM | POA: Insufficient documentation

## 2012-06-06 DIAGNOSIS — K829 Disease of gallbladder, unspecified: Secondary | ICD-10-CM | POA: Insufficient documentation

## 2012-06-06 DIAGNOSIS — Z0389 Encounter for observation for other suspected diseases and conditions ruled out: Secondary | ICD-10-CM | POA: Insufficient documentation

## 2012-06-06 DIAGNOSIS — N83209 Unspecified ovarian cyst, unspecified side: Secondary | ICD-10-CM | POA: Insufficient documentation

## 2012-06-06 DIAGNOSIS — J029 Acute pharyngitis, unspecified: Secondary | ICD-10-CM | POA: Insufficient documentation

## 2012-06-06 DIAGNOSIS — R112 Nausea with vomiting, unspecified: Secondary | ICD-10-CM | POA: Insufficient documentation

## 2012-06-06 LAB — COMPREHENSIVE METABOLIC PANEL
ALT: 11 U/L (ref 0–35)
BUN: 12 mg/dL (ref 6–23)
CO2: 28 mEq/L (ref 19–32)
Calcium: 9.6 mg/dL (ref 8.4–10.5)
Creatinine, Ser: 0.83 mg/dL (ref 0.50–1.10)
GFR calc Af Amer: >90 mL/min (ref >90)
GFR calc non Af Amer: >90 mL/min (ref >90)
Glucose, Bld: 94 mg/dL (ref 70–99)
Sodium: 140 mEq/L (ref 135–145)

## 2012-06-06 LAB — CBC WITH DIFFERENTIAL/PLATELET
Eosinophils Relative: 2 % (ref 0–5)
HCT: 38.7 % (ref 36.0–46.0)
Hemoglobin: 13.2 g/dL (ref 12.0–15.0)
Lymphocytes Relative: 23 % (ref 12–46)
Lymphs Abs: 2 10*3/uL (ref 0.7–4.0)
MCH: 31.4 pg (ref 26.0–34.0)
MCV: 91.9 fL (ref 78.0–100.0)
Monocytes Absolute: 0.5 10*3/uL (ref 0.1–1.0)
Monocytes Relative: 5 % (ref 3–12)
RBC: 4.21 MIL/uL (ref 3.87–5.11)
WBC: 8.9 10*3/uL (ref 4.0–10.5)

## 2012-06-06 NOTE — ED Notes (Signed)
Pt alert, arrives from home, c/o sore throat and a SOB, onset was over past few days, resp even unlabored, skin pwd

## 2012-06-06 NOTE — ED Notes (Signed)
PT is here with right upper quad pain for last 3-4 weeks and reports vomiting and diarrhea.  NO fever.  No urinary s/s and LMP finished yesterday.  No vag discharge

## 2012-06-07 MED ORDER — PENICILLIN G BENZATHINE 1200000 UNIT/2ML IM SUSP
1.2000 10*6.[IU] | Freq: Once | INTRAMUSCULAR | Status: AC
  Administered 2012-06-07: 1.2 10*6.[IU] via INTRAMUSCULAR
  Filled 2012-06-07: qty 2

## 2012-06-07 NOTE — ED Provider Notes (Signed)
History     CSN: [id number]  Arrival date & time 06/06/12  2248   First MD Initiated Contact with Patient 06/06/12 2311      Chief Complaint  Patient presents with  . Sore Throat    (Consider location/radiation/quality/duration/timing/severity/associated sxs/prior treatment) HPI Comments: Patient presents with sore throat, unrproductive cough, SOB, and difficulty swallowing for 3 days. Associated symptoms include subjective fevers. Patient also reports nausea and vomiting after eating for the past 5 months. Patient seen in ED 12/29/11 and diagnosed with minimal gallbladder sludge. HIDA scan on 04/10/12 negative. Patient has continued to have intermittent RUQ pain 8/10 every time she eats. Patient states that she has lost 40 lbs over the past 5 months as well. Imaging performed on that visit remarkable for gallbladder sludge. Denies chills or diaphoresis. Denies sick contacts. Denies hematemesis, melena, or hematochezia.      The history is provided by the patient. No language interpreter was used.    Past Medical History  Diagnosis Date  . Asthma   . Ovarian cyst   . MVP (mitral valve prolapse)   . Gall bladder disease     Past Surgical History  Procedure Date  . Appendectomy     Family History  Problem Relation Age of Onset  . Anesthesia problems Neg Hx   . Hypertension Maternal Aunt   . Hypertension Paternal Aunt   . Hypertension Maternal Grandmother   . Drug abuse Maternal Grandmother   . Hypertension Maternal Grandfather   . Drug abuse Maternal Grandfather   . Hypertension Paternal Grandmother   . Drug abuse Paternal Grandmother   . Hypertension Paternal Grandfather   . Drug abuse Paternal Grandfather     History  Substance Use Topics  . Smoking status: Never Smoker   . Smokeless tobacco: Never Used  . Alcohol Use: No    OB History    Grav Para Term Preterm Abortions TAB SAB Ect Mult Living   0 0 0 0 0 0 0 0 0 0       Review of Systems    Constitutional: Positive for fever. Negative for chills and diaphoresis.  HENT: Positive for sore throat.   Respiratory: Positive for cough and shortness of breath.   Gastrointestinal: Positive for nausea, vomiting and abdominal pain. Negative for blood in stool.    Allergies  Review of patient's allergies indicates no known allergies.  Home Medications  No current outpatient prescriptions on file.  BP 118/74  Pulse 79  Temp 98 F (36.7 C) (Oral)  Resp 16  SpO2 99%  LMP 06/02/2012  Physical Exam  Nursing note and vitals reviewed. Constitutional: She appears well-developed and well-nourished. She appears distressed.  HENT:  Head: Normocephalic and atraumatic.  Mouth/Throat: No oropharyngeal exudate.       Oropharnyx mildly injected, enlarged tonsils without exudate.  Eyes: Conjunctivae normal and EOM are normal. No scleral icterus.  Neck: Normal range of motion. Neck supple.  Cardiovascular: Normal rate, regular rhythm and normal heart sounds.   Pulmonary/Chest: Effort normal and breath sounds normal.  Abdominal: Soft. Bowel sounds are normal. There is tenderness.       Patient tender in the RUQ, positive Murphy's sign.  Neurological: She is alert.  Skin: Skin is warm.    ED Course  Procedures (including critical care time)   Labs Reviewed  RAPID STREP SCREEN   Results for orders placed during the hospital encounter of 06/06/12  RAPID STREP SCREEN      Component Value Range  Streptococcus, Group A Screen (Direct) NEGATIVE  NEGATIVE    No results found.   1. Sore throat   2. Tonsillitis   3. Cough   4. Nausea and vomiting   5. Biliary colic       MDM  Patient presented with sore throat, SOB, difficulty swallowing, and cough X 3 days. Patient also complaining of RUQ pain , nausea and vomiting. [facility] for abdominal symptoms. Patient afebrile. CBC: unremarkable CMP: unremarkable Urine pregnancy: negative. Patient left before  being seen for abdominal symptoms at [facility]. Strep at this visit: negative. On exam patient has markedly swollen tonsils without exudate. Patient given IM penicillin and treated for strep empirically in lieu of negative rapid strep on and discharged with return precautions. Patient seen 4/13 for abdominal pain, imaging remarkable for minimal sludge. HIDA scan done 8/13 unremarkable. Patient still continues to have gallbladder issues. Positive Murphy's sign on this visit. Labs done at [facility], not concerning for acute cholecystitis. Patient discharged with referral to general surgery for possible cholecystectomy. No red flags for peritonsillar abscess, acute cholecystitis, or cholangitis.        Pixie Casino, PA-C 06/07/12 1208

## 2012-06-07 NOTE — ED Provider Notes (Signed)
Medical screening examination/treatment/procedure(s) were performed by non-physician practitioner and as supervising physician I was immediately available for consultation/collaboration. Meridee Branum, MD, FACEP   Anie Juniel L Koreena Joost, MD 06/07/12 2302 

## 2012-06-21 ENCOUNTER — Encounter (HOSPITAL_COMMUNITY): Payer: Self-pay | Admitting: *Deleted

## 2012-06-21 ENCOUNTER — Emergency Department (HOSPITAL_COMMUNITY)
Admission: EM | Admit: 2012-06-21 | Discharge: 2012-06-22 | Disposition: A | Discharge status: Home / Self Care | Payer: Self-pay | Attending: Emergency Medicine | Admitting: Emergency Medicine

## 2012-06-21 DIAGNOSIS — N390 Urinary tract infection, site not specified: Secondary | ICD-10-CM | POA: Insufficient documentation

## 2012-06-21 DIAGNOSIS — I059 Rheumatic mitral valve disease, unspecified: Secondary | ICD-10-CM | POA: Insufficient documentation

## 2012-06-21 DIAGNOSIS — R195 Other fecal abnormalities: Secondary | ICD-10-CM | POA: Insufficient documentation

## 2012-06-21 LAB — CBC WITH DIFFERENTIAL/PLATELET
Eosinophils Absolute: 0.2 10*3/uL (ref 0.0–0.7)
Hemoglobin: 13.1 g/dL (ref 12.0–15.0)
Lymphocytes Relative: 15 % (ref 12–46)
Lymphs Abs: 1.6 10*3/uL (ref 0.7–4.0)
MCH: 31.6 pg (ref 26.0–34.0)
MCV: 90.8 fL (ref 78.0–100.0)
Monocytes Relative: 7 % (ref 3–12)
Neutrophils Relative %: 76 % (ref 43–77)
RBC: 4.14 MIL/uL (ref 3.87–5.11)
WBC: 10.9 10*3/uL — ABNORMAL HIGH (ref 4.0–10.5)

## 2012-06-21 LAB — URINE MICROSCOPIC-ADD ON

## 2012-06-21 LAB — COMPREHENSIVE METABOLIC PANEL
ALT: 8 U/L (ref 0–35)
Alkaline Phosphatase: 65 U/L (ref 39–117)
BUN: 16 mg/dL (ref 6–23)
CO2: 26 mEq/L (ref 19–32)
GFR calc Af Amer: >90 mL/min (ref >90)
GFR calc non Af Amer: >90 mL/min (ref >90)
Glucose, Bld: 91 mg/dL (ref 70–99)
Potassium: 4.2 mEq/L (ref 3.5–5.1)
Sodium: 138 mEq/L (ref 135–145)
Total Bilirubin: 0.2 mg/dL — ABNORMAL LOW (ref 0.3–1.2)
Total Protein: 7.7 g/dL (ref 6.0–8.3)

## 2012-06-21 LAB — URINALYSIS, ROUTINE W REFLEX MICROSCOPIC
Bilirubin Urine: NEGATIVE
Glucose, UA: NEGATIVE mg/dL
Ketones, ur: NEGATIVE mg/dL
Specific Gravity, Urine: 1.01 (ref 1.005–1.030)
pH: 7 (ref 5.0–8.0)

## 2012-06-21 LAB — LIPASE, BLOOD: Lipase: 19 U/L (ref 11–59)

## 2012-06-21 MED ORDER — HYDROCODONE-ACETAMINOPHEN 5-325 MG PO TABS
1.0000 | ORAL_TABLET | Freq: Once | ORAL | Status: AC
  Administered 2012-06-21: 1 via ORAL
  Filled 2012-06-21: qty 1

## 2012-06-21 MED ORDER — SULFAMETHOXAZOLE-TRIMETHOPRIM 800-160 MG PO TABS: 1.0000 | ORAL_TABLET | Freq: Two times a day (BID) | ORAL | Status: DC

## 2012-06-21 NOTE — ED Provider Notes (Signed)
History     CSN: [id number]  Arrival date & time 06/21/12  2033   First MD Initiated Contact with Patient 06/21/12 2116      Chief Complaint  Patient presents with  . Abdominal Pain   HPI  21 year old female history of ovarian cysts and asthma presents with recurrent abdominal pain. She was seen here 2 months ago with a very similar presentation at that time ultrasound of her right upper quadrant was normal. With no evidence of gallbladder pathology. Today she presents with ongoing right upper quadrant abdominal pain with nausea. He denies any pelvic pain, vaginal discharge, dysuria, hematuria, trauma. She further denies alcohol use. The pain does not radiate. It feels like a deep cramp in her right upper quadrant. He further denies any fevers or anorexia. Her last bowel movement was today. She denies constipation.  Past Medical History  Diagnosis Date  . Asthma   . Ovarian cyst   . MVP (mitral valve prolapse)   . Gall bladder disease     Past Surgical History  Procedure Date  . Appendectomy     Family History  Problem Relation Age of Onset  . Anesthesia problems Neg Hx   . Hypertension Maternal Aunt   . Hypertension Paternal Aunt   . Hypertension Maternal Grandmother   . Drug abuse Maternal Grandmother   . Hypertension Maternal Grandfather   . Drug abuse Maternal Grandfather   . Hypertension Paternal Grandmother   . Drug abuse Paternal Grandmother   . Hypertension Paternal Grandfather   . Drug abuse Paternal Grandfather     History  Substance Use Topics  . Smoking status: Never Smoker   . Smokeless tobacco: Never Used  . Alcohol Use: No    OB History    Grav Para Term Preterm Abortions TAB SAB Ect Mult Living   0 0 0 0 0 0 0 0 0 0       Review of Systems  Constitutional: Negative for fever, chills, activity change and appetite change.  HENT: Negative for ear pain, congestion, rhinorrhea and neck pain.   Eyes: Negative for pain.  Respiratory: Negative for  cough and shortness of breath.   Cardiovascular: Negative for chest pain and palpitations.  Gastrointestinal: Positive for nausea, abdominal pain and diarrhea. Negative for vomiting.  Genitourinary: Negative for dysuria, difficulty urinating and pelvic pain.  Musculoskeletal: Negative for back pain.  Skin: Negative for rash and wound.  Neurological: Negative for weakness and headaches.  Psychiatric/Behavioral: Negative for behavioral problems, confusion and agitation.    Allergies  Review of patient's allergies indicates no known allergies.  Home Medications   Current Outpatient Rx  Name Route Sig Dispense Refill  . ACETAMINOPHEN 500 MG PO TABS Oral Take 500 mg by mouth every 6 (six) hours as needed. For pain      BP 128/79  Pulse 86  Temp 97.6 F (36.4 C) (Oral)  Resp 14  SpO2 100%  LMP 06/09/2012  Physical Exam  Constitutional: She is oriented to person, place, and time. She appears well-developed and well-nourished. No distress.  HENT:  Head: Normocephalic and atraumatic.  Nose: Nose normal.  Mouth/Throat: Oropharynx is clear and moist.  Eyes: EOM are normal. Pupils are equal, round, and reactive to light.  Neck: Normal range of motion. Neck supple. No tracheal deviation present.  Cardiovascular: Normal rate, regular rhythm, normal heart sounds and intact distal pulses.   Pulmonary/Chest: Effort normal and breath sounds normal. She has no rales.  Abdominal: Soft. Bowel sounds are  normal. She exhibits no distension. There is tenderness. There is no rebound and no guarding.       Mild Right upper quadrant tenderness to palpation. Negative Murphy's sign. No pain at McBurney's point. Negative Rovsing sign no CVA tenderness  Musculoskeletal: Normal range of motion. She exhibits no tenderness.  Neurological: She is alert and oriented to person, place, and time.  Skin: Skin is warm and dry. No rash noted.  Psychiatric: She has a normal mood and affect. Her behavior is normal.      ED Course  Procedures   Results for orders placed during the hospital encounter of 06/21/12  URINALYSIS, ROUTINE W REFLEX MICROSCOPIC      Component Value Range   Color, Urine YELLOW  YELLOW   APPearance CLOUDY (*) CLEAR   Specific Gravity, Urine 1.010  1.005 - 1.030   pH 7.0  5.0 - 8.0   Glucose, UA NEGATIVE  NEGATIVE mg/dL   Hgb urine dipstick NEGATIVE  NEGATIVE   Bilirubin Urine NEGATIVE  NEGATIVE   Ketones, ur NEGATIVE  NEGATIVE mg/dL   Protein, ur NEGATIVE  NEGATIVE mg/dL   Urobilinogen, UA 0.2  0.0 - 1.0 mg/dL   Nitrite NEGATIVE  NEGATIVE   Leukocytes, UA LARGE (*) NEGATIVE  PREGNANCY, URINE      Component Value Range   Preg Test, Ur NEGATIVE  NEGATIVE  CBC WITH DIFFERENTIAL      Component Value Range   WBC 10.9 (*) 4.0 - 10.5 K/uL   RBC 4.14  3.87 - 5.11 MIL/uL   Hemoglobin 13.1  12.0 - 15.0 g/dL   HCT 16.1  09.6 - 04.5 %   MCV 90.8  78.0 - 100.0 fL   MCH 31.6  26.0 - 34.0 pg   MCHC 34.8  30.0 - 36.0 g/dL   RDW 40.9  81.1 - 91.4 %   Platelets 270  150 - 400 K/uL   Neutrophils Relative 76  43 - 77 %   Neutro Abs 8.3 (*) 1.7 - 7.7 K/uL   Lymphocytes Relative 15  12 - 46 %   Lymphs Abs 1.6  0.7 - 4.0 K/uL   Monocytes Relative 7  3 - 12 %   Monocytes Absolute 0.8  0.1 - 1.0 K/uL   Eosinophils Relative 2  0 - 5 %   Eosinophils Absolute 0.2  0.0 - 0.7 K/uL   Basophils Relative 0  0 - 1 %   Basophils Absolute 0.0  0.0 - 0.1 K/uL  COMPREHENSIVE METABOLIC PANEL      Component Value Range   Sodium 138  135 - 145 mEq/L   Potassium 4.2  3.5 - 5.1 mEq/L   Chloride 102  96 - 112 mEq/L   CO2 26  19 - 32 mEq/L   Glucose, Bld 91  70 - 99 mg/dL   BUN 16  6 - 23 mg/dL   Creatinine, Ser 7.82  0.50 - 1.10 mg/dL   Calcium 9.4  8.4 - 95.6 mg/dL   Total Protein 7.7  6.0 - 8.3 g/dL   Albumin 3.7  3.5 - 5.2 g/dL   AST 12  0 - 37 U/L   ALT 8  0 - 35 U/L   Alkaline Phosphatase 65  39 - 117 U/L   Total Bilirubin 0.2 (*) 0.3 - 1.2 mg/dL   GFR calc non Af Amer >90  >90 mL/min    GFR calc Af Amer >90  >90 mL/min  LIPASE, BLOOD  Component Value Range   Lipase 19  11 - 59 U/L  URINE MICROSCOPIC-ADD ON      Component Value Range   Squamous Epithelial / LPF FEW (*) RARE   WBC, UA 21-50  <3 WBC/hpf   Bacteria, UA MANY (*) RARE  OCCULT BLOOD, POC DEVICE      Component Value Range   Fecal Occult Bld POSITIVE       1. UTI (lower urinary tract infection)   2. Occult blood in stools       MDM     21 year old female in no acute distress, afebrile, vital signs stable, non toxic appearing who presents with right upper quadrant abdominal pain. Negative Murphy's sign. No rebound or guarding. Denies dysuria but UA mistreats large leukocytes with many bacteria. Will treat for urinary tract infection. Recent ultrasound of abdomen demonstrated no gallbladder pathology. Labwork reassuring. No pain at McBurney's point negative Rovsing sign. Doubt appendicitis. Lipase normal doubt Pancreatitis. Hemoccult test positive. A thorough discussion with the patient including return precautions, findings, and plan. Will follow up with primary care provider and recheck stool for occult blood.     New Prescriptions   SULFAMETHOXAZOLE-TRIMETHOPRIM (SEPTRA DS) 800-160 MG PER TABLET    Take 1 tablet by mouth 2 (two) times daily.           Nadara Mustard, MD 06/21/12 [phone]

## 2012-06-21 NOTE — ED Notes (Signed)
abd pain for 2 months n v diarrhea.  lmp oct 7th

## 2012-06-21 NOTE — ED Notes (Signed)
Pt reports having RUQ pain x several months with increased pain over the last several days.  Pt reports sharp shooting pain.  Pt was seen for the pain and was told that she has gallbladder issues.  Pt tender on palpation.  LBM 5pm today-diarrhea.  BS present active.

## 2012-06-22 NOTE — ED Provider Notes (Signed)
I saw and evaluated the patient, reviewed the resident's note and I agree with the findings and plan.     Nelia Shi, MD 06/22/12 1200

## 2012-08-21 ENCOUNTER — Encounter (HOSPITAL_COMMUNITY): Payer: Self-pay | Admitting: *Deleted

## 2012-08-21 ENCOUNTER — Emergency Department (HOSPITAL_COMMUNITY)
Admission: EM | Admit: 2012-08-21 | Discharge: 2012-08-21 | Disposition: A | Discharge status: Home / Self Care | Payer: Self-pay | Attending: Emergency Medicine | Admitting: Emergency Medicine

## 2012-08-21 DIAGNOSIS — Z8719 Personal history of other diseases of the digestive system: Secondary | ICD-10-CM | POA: Insufficient documentation

## 2012-08-21 DIAGNOSIS — R1084 Generalized abdominal pain: Secondary | ICD-10-CM | POA: Insufficient documentation

## 2012-08-21 DIAGNOSIS — Z8679 Personal history of other diseases of the circulatory system: Secondary | ICD-10-CM | POA: Insufficient documentation

## 2012-08-21 DIAGNOSIS — Z8742 Personal history of other diseases of the female genital tract: Secondary | ICD-10-CM | POA: Insufficient documentation

## 2012-08-21 DIAGNOSIS — J45909 Unspecified asthma, uncomplicated: Secondary | ICD-10-CM | POA: Insufficient documentation

## 2012-08-21 DIAGNOSIS — A088 Other specified intestinal infections: Secondary | ICD-10-CM | POA: Insufficient documentation

## 2012-08-21 DIAGNOSIS — R5381 Other malaise: Secondary | ICD-10-CM | POA: Insufficient documentation

## 2012-08-21 DIAGNOSIS — R112 Nausea with vomiting, unspecified: Secondary | ICD-10-CM | POA: Insufficient documentation

## 2012-08-21 DIAGNOSIS — A084 Viral intestinal infection, unspecified: Secondary | ICD-10-CM

## 2012-08-21 DIAGNOSIS — Z3202 Encounter for pregnancy test, result negative: Secondary | ICD-10-CM | POA: Insufficient documentation

## 2012-08-21 DIAGNOSIS — R42 Dizziness and giddiness: Secondary | ICD-10-CM | POA: Insufficient documentation

## 2012-08-21 LAB — COMPREHENSIVE METABOLIC PANEL
ALT: 13 U/L (ref 0–35)
AST: 15 U/L (ref 0–37)
Albumin: 4 g/dL (ref 3.5–5.2)
Albumin: 4.3 g/dL (ref 3.5–5.2)
Alkaline Phosphatase: 55 U/L (ref 39–117)
Alkaline Phosphatase: 58 U/L (ref 39–117)
BUN: 17 mg/dL (ref 6–23)
Chloride: 105 mEq/L (ref 96–112)
Creatinine, Ser: 0.8 mg/dL (ref 0.50–1.10)
GFR calc Af Amer: >90 mL/min (ref >90)
Glucose, Bld: 100 mg/dL — ABNORMAL HIGH (ref 70–99)
Potassium: 3.8 mEq/L (ref 3.5–5.1)
Potassium: 3.3 mEq/L — ABNORMAL LOW (ref 3.5–5.1)
Sodium: 142 mEq/L (ref 135–145)
Total Bilirubin: 0.4 mg/dL (ref 0.3–1.2)
Total Protein: 7.9 g/dL (ref 6.0–8.3)
Total Protein: 8.2 g/dL (ref 6.0–8.3)

## 2012-08-21 LAB — CBC WITH DIFFERENTIAL/PLATELET
Basophils Relative: 0 % (ref 0–1)
Basophils Relative: 0 % (ref 0–1)
Eosinophils Absolute: 0.1 10*3/uL (ref 0.0–0.7)
Eosinophils Absolute: 0.2 10*3/uL (ref 0.0–0.7)
HCT: 41 % (ref 36.0–46.0)
Hemoglobin: 13.8 g/dL (ref 12.0–15.0)
Lymphs Abs: 1.3 10*3/uL (ref 0.7–4.0)
MCH: 31.2 pg (ref 26.0–34.0)
MCH: 31.3 pg (ref 26.0–34.0)
MCHC: 33.7 g/dL (ref 30.0–36.0)
MCHC: 33.8 g/dL (ref 30.0–36.0)
MCV: 92.8 fL (ref 78.0–100.0)
Monocytes Absolute: 0.6 10*3/uL (ref 0.1–1.0)
Monocytes Relative: 5 % (ref 3–12)
Neutro Abs: 13 10*3/uL — ABNORMAL HIGH (ref 1.7–7.7)
Neutrophils Relative %: 84 % — ABNORMAL HIGH (ref 43–77)
Neutrophils Relative %: 87 % — ABNORMAL HIGH (ref 43–77)
Platelets: 267 10*3/uL (ref 150–400)
RBC: 4.42 MIL/uL (ref 3.87–5.11)
RBC: 4.48 MIL/uL (ref 3.87–5.11)

## 2012-08-21 LAB — URINALYSIS, ROUTINE W REFLEX MICROSCOPIC
Bilirubin Urine: NEGATIVE
Ketones, ur: NEGATIVE mg/dL
Leukocytes, UA: NEGATIVE
Nitrite: NEGATIVE
Protein, ur: NEGATIVE mg/dL
Urobilinogen, UA: 1 mg/dL (ref 0.0–1.0)

## 2012-08-21 LAB — LIPASE, BLOOD: Lipase: 17 U/L (ref 11–59)

## 2012-08-21 LAB — POCT PREGNANCY, URINE: Preg Test, Ur: NEGATIVE

## 2012-08-21 MED ORDER — ONDANSETRON HCL 4 MG/2ML IJ SOLN
4.0000 mg | Freq: Once | INTRAMUSCULAR | Status: AC
  Administered 2012-08-21: 4 mg via INTRAVENOUS
  Filled 2012-08-21: qty 2

## 2012-08-21 MED ORDER — SODIUM CHLORIDE 0.9 % IV SOLN
1000.0000 mL | INTRAVENOUS | Status: DC
  Administered 2012-08-21: 1000 mL via INTRAVENOUS

## 2012-08-21 MED ORDER — ONDANSETRON 4 MG PO TBDP
8.0000 mg | ORAL_TABLET | Freq: Once | ORAL | Status: AC
  Administered 2012-08-21: 8 mg via ORAL
  Filled 2012-08-21: qty 2

## 2012-08-21 MED ORDER — DICYCLOMINE HCL 10 MG PO CAPS
10.0000 mg | ORAL_CAPSULE | Freq: Once | ORAL | Status: AC
  Administered 2012-08-21: 10 mg via ORAL
  Filled 2012-08-21: qty 1

## 2012-08-21 MED ORDER — SODIUM CHLORIDE 0.9 % IV SOLN
1000.0000 mL | Freq: Once | INTRAVENOUS | Status: AC
  Administered 2012-08-21: 1000 mL via INTRAVENOUS

## 2012-08-21 MED ORDER — ONDANSETRON HCL 4 MG PO TABS: 4.0000 mg | ORAL_TABLET | Freq: Four times a day (QID) | ORAL | Status: DC

## 2012-08-21 MED ORDER — FENTANYL CITRATE 0.05 MG/ML IJ SOLN
50.0000 ug | Freq: Once | INTRAMUSCULAR | Status: AC
  Administered 2012-08-21: 50 ug via INTRAVENOUS
  Filled 2012-08-21: qty 2

## 2012-08-21 MED ORDER — DICYCLOMINE HCL 20 MG PO TABS: 20.0000 mg | ORAL_TABLET | Freq: Two times a day (BID) | ORAL | Status: DC

## 2012-08-21 MED ORDER — PROMETHAZINE HCL 25 MG RE SUPP: 25.0000 mg | Freq: Four times a day (QID) | RECTAL | Status: DC | PRN

## 2012-08-21 MED ORDER — HYDROMORPHONE HCL PF 1 MG/ML IJ SOLN
0.5000 mg | Freq: Once | INTRAMUSCULAR | Status: AC
  Administered 2012-08-21: 0.5 mg via INTRAVENOUS
  Filled 2012-08-21: qty 1

## 2012-08-21 NOTE — ED Notes (Signed)
PT reported nausea Med given per protocal.

## 2012-08-21 NOTE — ED Provider Notes (Signed)
Medical screening examination/treatment/procedure(s) were performed by non-physician practitioner and as supervising physician I was immediately available for consultation/collaboration.   Gerhard Munch, MD 08/21/12 [phone]

## 2012-08-21 NOTE — ED Notes (Signed)
Pt drinking sprite  

## 2012-08-21 NOTE — ED Provider Notes (Signed)
History     CSN: [id number]  Arrival date & time 08/21/12  1051   First MD Initiated Contact with Patient 08/21/12 1525      Chief Complaint  Patient presents with  . Abdominal Pain  . Emesis  . Diarrhea    (Consider location/radiation/quality/duration/timing/severity/associated sxs/prior treatment) HPI (S) Laurie [name] is a 21 y.o. female with complaint of gastrointestinal symptoms of watery diarrhea, abdominal pain, nausea, vomiting, lightheadedness, weakness onset this am at 0200hours.awoken form sleep with abdominal pain. 8 Episodes of vomiting, 4 episodes of watery stool. No blood in stool or vomitus. She denies contacts with similar sxs, ingestion of suspect foods or water, history of similar sxs, recent foreign travel . Denies fevers, chills, myalgias, arthralgias. Denies DOE, SOB, chest tightness or pressure, radiation to left arm, jaw or back, or diaphoresis. Denies dysuria, flank pain, suprapubic pain, frequency, urgency, or hematuria. Denies headaches, visual disturbances.  Previous surgical hx of appndectomy approximately 2 years ago.    Past Medical History  Diagnosis Date  . Asthma   . Ovarian cyst   . MVP (mitral valve prolapse)   . Gall bladder disease     Past Surgical History  Procedure Date  . Appendectomy     Family History  Problem Relation Age of Onset  . Anesthesia problems Neg Hx   . Hypertension Maternal Aunt   . Hypertension Paternal Aunt   . Hypertension Maternal Grandmother   . Drug abuse Maternal Grandmother   . Hypertension Maternal Grandfather   . Drug abuse Maternal Grandfather   . Hypertension Paternal Grandmother   . Drug abuse Paternal Grandmother   . Hypertension Paternal Grandfather   . Drug abuse Paternal Grandfather     History  Substance Use Topics  . Smoking status: Never Smoker   . Smokeless tobacco: Never Used  . Alcohol Use: No    OB History    Grav Para Term Preterm Abortions TAB SAB Ect Mult Living   0 0 0  0 0 0 0 0 0 0      Review of Systems Ten systems reviewed and are negative for acute change, except as noted in the HPI.   Allergies  Review of patient's allergies indicates no known allergies.  Home Medications  No current outpatient prescriptions on file.  BP 120/80  Pulse 80  Temp 97.5 F (36.4 C) (Oral)  Resp 19  SpO2 100%  LMP 08/15/2012  Physical Exam Physical Exam  Nursing note and vitals reviewed. Constitutional: She is oriented to person, place, and time. She appears well-developed and well-nourished. No distress.  HENT:  Head: Normocephalic and atraumatic.  Eyes: Conjunctivae normal and EOM are normal. Pupils are equal, round, and reactive to light. No scleral icterus.  Neck: Normal range of motion.  Cardiovascular: Normal rate, regular rhythm and normal heart sounds.  Exam reveals no gallop and no friction rub.   No murmur heard. Pulmonary/Chest: Effort normal and breath sounds normal. No respiratory distress.  Abdominal: Soft. Bowel sounds are hyperactive. She exhibits no distension and no mass. There is diffuse, mild tenderness. There is no guarding.  Neurological: She is alert and oriented to person, place, and time.  Skin: Skin is warm and dry. She is not diaphoretic.    ED Course  Procedures (including critical care time)  Labs Reviewed  CBC WITH DIFFERENTIAL - Abnormal; Notable for the following:    WBC 12.7 (*)     Neutrophils Relative 84 (*)     Neutro Abs  10.6 (*)     Lymphocytes Relative 10 (*)     All other components within normal limits  COMPREHENSIVE METABOLIC PANEL - Abnormal; Notable for the following:    Glucose, Bld 100 (*)     All other components within normal limits  CBC WITH DIFFERENTIAL - Abnormal; Notable for the following:    WBC 15.0 (*)     Neutrophils Relative 87 (*)     Neutro Abs 13.0 (*)     Lymphocytes Relative 7 (*)     All other components within normal limits  COMPREHENSIVE METABOLIC PANEL - Abnormal; Notable for  the following:    Potassium 3.3 (*)     All other components within normal limits  LIPASE, BLOOD  POCT PREGNANCY, URINE  URINALYSIS, ROUTINE W REFLEX MICROSCOPIC   No results found.   No diagnosis found.    MDM   Filed Vitals:   08/21/12 1800  BP: 115/51  Pulse: 64  Temp:   Resp:    Abdominal exam is benign. No bloody or bilious emesis. . She appears .non-toxic, afebrile with a normal abdominal exam, and in light of the history, I think acute intrabdominal pathology isevery unlikely at this time. I have discussed symptoms of immediate reasons to return to the ED, including  focal abdominal pain, continued vomiting, fever, a hard belly or painful belly, refusal to eat or drink.  I have recommended small amounts clear fluids frequently, soups, juices, water and advance diet as tolerated. Return osymptoms persist or worsen; I have alerted the patient to call if high fever, dehydration, marked weakness, fainting, increased abdominal pain, blood in stool or vomit.    Filed Vitals:   08/21/12 1529 08/21/12 1600 08/21/12 1700 08/21/12 1800  BP: 120/80 119/64 94/42 115/51  Pulse: 80 69 55 64  Temp: 97.5 F (36.4 C)     TempSrc: Oral     Resp: 19     SpO2: 100% 100% 100% 97%       Arthor Captain, PA-C 08/21/12 1922

## 2012-08-21 NOTE — ED Notes (Signed)
Pt dc to home with family.  Pt states understanding to dc paperwork.  Work note given.  Pt ambulatory to exit without difficulty.  Denies need for w/c

## 2012-08-21 NOTE — ED Notes (Signed)
Pt reports having generalized abd pain, vomiting and diarrhea since this am.

## 2012-11-29 ENCOUNTER — Encounter (HOSPITAL_COMMUNITY): Payer: Self-pay | Admitting: *Deleted

## 2012-11-29 ENCOUNTER — Emergency Department (HOSPITAL_COMMUNITY): Payer: Self-pay

## 2012-11-29 ENCOUNTER — Emergency Department (HOSPITAL_COMMUNITY)
Admission: EM | Admit: 2012-11-29 | Discharge: 2012-11-30 | Disposition: A | Discharge status: Home / Self Care | Payer: Self-pay | Attending: Emergency Medicine | Admitting: Emergency Medicine

## 2012-11-29 DIAGNOSIS — Z8719 Personal history of other diseases of the digestive system: Secondary | ICD-10-CM | POA: Insufficient documentation

## 2012-11-29 DIAGNOSIS — J45909 Unspecified asthma, uncomplicated: Secondary | ICD-10-CM | POA: Insufficient documentation

## 2012-11-29 DIAGNOSIS — Z8742 Personal history of other diseases of the female genital tract: Secondary | ICD-10-CM | POA: Insufficient documentation

## 2012-11-29 DIAGNOSIS — Z8679 Personal history of other diseases of the circulatory system: Secondary | ICD-10-CM | POA: Insufficient documentation

## 2012-11-29 DIAGNOSIS — R112 Nausea with vomiting, unspecified: Secondary | ICD-10-CM | POA: Insufficient documentation

## 2012-11-29 DIAGNOSIS — R1010 Upper abdominal pain, unspecified: Secondary | ICD-10-CM

## 2012-11-29 DIAGNOSIS — Z3202 Encounter for pregnancy test, result negative: Secondary | ICD-10-CM | POA: Insufficient documentation

## 2012-11-29 DIAGNOSIS — R6883 Chills (without fever): Secondary | ICD-10-CM | POA: Insufficient documentation

## 2012-11-29 DIAGNOSIS — R109 Unspecified abdominal pain: Secondary | ICD-10-CM | POA: Insufficient documentation

## 2012-11-29 DIAGNOSIS — F17200 Nicotine dependence, unspecified, uncomplicated: Secondary | ICD-10-CM | POA: Insufficient documentation

## 2012-11-29 DIAGNOSIS — Z79899 Other long term (current) drug therapy: Secondary | ICD-10-CM | POA: Insufficient documentation

## 2012-11-29 LAB — BASIC METABOLIC PANEL
Calcium: 10 mg/dL (ref 8.4–10.5)
GFR calc Af Amer: >90 mL/min (ref >90)
GFR calc non Af Amer: >90 mL/min (ref >90)
Glucose, Bld: 133 mg/dL — ABNORMAL HIGH (ref 70–99)
Sodium: 138 mEq/L (ref 135–145)

## 2012-11-29 LAB — POCT I-STAT TROPONIN I: Troponin i, poc: 0 ng/mL (ref 0.00–0.08)

## 2012-11-29 LAB — CBC
MCH: 30.8 pg (ref 26.0–34.0)
MCHC: 35 g/dL (ref 30.0–36.0)
Platelets: 299 10*3/uL (ref 150–400)
RDW: 12.4 % (ref 11.5–15.5)

## 2012-11-29 NOTE — ED Notes (Signed)
Pt reports her CP started yesterday then eased off and then came back again today, left sided, no radiation. Pt rates pain at 10/10. Pt reports she was at work when it started, is a Investment banker, operational at a grill. Pt reports she took tylenol but had no relief. Pt has been drinking energy drinks. Pt in nad, skin warm an dry, resp e/u.

## 2012-11-29 NOTE — ED Notes (Signed)
Pt c/o sharp CP that started at 2 pm, getting worse.  Also c/o "numbness in legs" and "shaking".

## 2012-11-30 ENCOUNTER — Encounter (HOSPITAL_COMMUNITY): Payer: Self-pay | Admitting: Emergency Medicine

## 2012-11-30 LAB — URINE MICROSCOPIC-ADD ON

## 2012-11-30 LAB — URINALYSIS, ROUTINE W REFLEX MICROSCOPIC
Bilirubin Urine: NEGATIVE
Glucose, UA: NEGATIVE mg/dL
Hgb urine dipstick: NEGATIVE
Ketones, ur: NEGATIVE mg/dL
Nitrite: NEGATIVE
Protein, ur: NEGATIVE mg/dL
Specific Gravity, Urine: 1.014 (ref 1.005–1.030)
Urobilinogen, UA: 0.2 mg/dL (ref 0.0–1.0)
pH: 6 (ref 5.0–8.0)

## 2012-11-30 LAB — PREGNANCY, URINE: Preg Test, Ur: NEGATIVE

## 2012-11-30 MED ORDER — HYDROCODONE-ACETAMINOPHEN 5-325 MG PO TABS
1.0000 | ORAL_TABLET | Freq: Once | ORAL | Status: AC
  Administered 2012-11-30: 1 via ORAL
  Filled 2012-11-30: qty 1

## 2012-11-30 NOTE — ED Provider Notes (Signed)
History     CSN: [id number]  Arrival date & time 11/29/12  2120   First MD Initiated Contact with Patient 11/29/12 2250      Chief Complaint  Patient presents with  . Chest Pain    (Consider location/radiation/quality/duration/timing/severity/associated sxs/prior treatment) HPI Laurie [name] is a 22 year old female with past history of gallbladder disease and asthma presents to the ED with chest pain and upper abdominal pain. She states the pain is underneath her right breast and along her rib border. The pain started at 2 pm today and was a sudden onset, associated with nausea, 8/10, and is constant. She took some Tylenol at work, but got little relief. She also took 2 puffs of her inhaler which she states provided some relief of the chest pain and tightness. She reports having chills today but denies fever or emesis. She also reports having pain in her RUQ and states she has had a history of biliary sludge. She states the pain is worse with eating and onset of pain is usually 5-10 minutes after eating and it is associated with emesis. She reports vomiting with meals over the last 3 weeks. She reports the pain radiates to her left mid-back.  Past Medical History  Diagnosis Date  . Asthma   . Ovarian cyst   . MVP (mitral valve prolapse)   . Gall bladder disease     Past Surgical History  Procedure Laterality Date  . Appendectomy      Family History  Problem Relation Age of Onset  . Anesthesia problems Neg Hx   . Hypertension Maternal Aunt   . Hypertension Paternal Aunt   . Hypertension Maternal Grandmother   . Drug abuse Maternal Grandmother   . Hypertension Maternal Grandfather   . Drug abuse Maternal Grandfather   . Hypertension Paternal Grandmother   . Drug abuse Paternal Grandmother   . Hypertension Paternal Grandfather   . Drug abuse Paternal Grandfather     History  Substance Use Topics  . Smoking status: Current Every Day Smoker -- 0.50 packs/day    Types:  Cigarettes  . Smokeless tobacco: Never Used  . Alcohol Use: No    OB History   Grav Para Term Preterm Abortions TAB SAB Ect Mult Living   0 0 0 0 0 0 0 0 0 0       Review of Systems All pertinent positives and negatives reviewed in the history of present illness Allergies  Review of patient's allergies indicates no known allergies.  Home Medications   Current Outpatient Rx  Name  Route  Sig  Dispense  Refill  . acetaminophen (TYLENOL) 500 MG tablet   Oral   Take 1,000 mg by mouth every 6 (six) hours as needed for pain.         Marland Kitchen albuterol (PROVENTIL HFA;VENTOLIN HFA) 108 (90 BASE) MCG/ACT inhaler   Inhalation   Inhale 2 puffs into the lungs every 6 (six) hours as needed for wheezing.           BP 117/61  Pulse 108  Temp(Src) 98 F (36.7 C) (Oral)  Resp 18  SpO2 100%  LMP 11/23/2012  Physical Exam  Constitutional: She is oriented to person, place, and time. She appears well-developed and well-nourished. No distress.  HENT:  Head: Normocephalic and atraumatic.  Neck: Normal range of motion. Neck supple.  Cardiovascular: Normal rate, regular rhythm, normal heart sounds and intact distal pulses.   Pulmonary/Chest: Effort normal and breath sounds normal. She has  no wheezes.  Abdominal: Soft. Bowel sounds are normal. She exhibits no distension. There is no rebound and no guarding.    Mild tenderness to palpation of RUQ, LUQ and epigastric regions. Questionable Murphy's sign.  Neurological: She is alert and oriented to person, place, and time.  Skin: Skin is warm and dry.    ED Course  Procedures (including critical care time)  Labs Reviewed  BASIC METABOLIC PANEL - Abnormal; Notable for the following:    Glucose, Bld 133 (*)    All other components within normal limits  CBC  URINALYSIS, ROUTINE W REFLEX MICROSCOPIC  PREGNANCY, URINE  POCT I-STAT TROPONIN I   Dg Chest 2 View  11/29/2012  *RADIOLOGY REPORT*  Clinical Data: Chest pain.  CHEST - 2 VIEW   Comparison: 05/28/2004.  Findings:  Cardiopericardial silhouette within normal limits. Mediastinal contours normal. Trachea midline.  No airspace disease or effusion.  IMPRESSION: No active cardiopulmonary disease.   Original Report Authenticated By: Andreas Newport, M.D.     The patient had normal HIDA scan last April. The patient will be referred back to her PCP. Told to return here as needed. Patient is not having chest pain but having upper abd pain which has been an ongoing issue.    MDM  MDM Reviewed: vitals, nursing note and previous chart Reviewed previous: labs and ultrasound Interpretation: ECG, labs and x-ray            Carlyle Dolly, PA-C 11/30/12 0045  Carlyle Dolly, PA-C 11/30/12 [phone]

## 2012-12-03 NOTE — ED Provider Notes (Signed)
Medical screening examination/treatment/procedure(s) were performed by non-physician practitioner and as supervising physician I was immediately available for consultation/collaboration.    Date: 11/29/2012  Rate: 106  Rhythm: sinus tachycardia  QRS Axis: normal  Intervals: normal  ST/T Wave abnormalities: nonspecific ST changes  Conduction Disutrbances:none     Joya Gaskins, MD 12/03/12 [phone]

## 2013-02-27 ENCOUNTER — Emergency Department: Payer: Self-pay | Admitting: Unknown Physician Specialty

## 2013-02-27 LAB — COMPREHENSIVE METABOLIC PANEL
Albumin: 3.7 g/dL (ref 3.4–5.0)
Alkaline Phosphatase: 56 U/L (ref 50–136)
Bilirubin,Total: 0.2 mg/dL (ref 0.2–1.0)
Chloride: 108 mmol/L — ABNORMAL HIGH (ref 98–107)
Co2: 27 mmol/L (ref 21–32)
Creatinine: 0.84 mg/dL (ref 0.60–1.30)
EGFR (African American): >60
EGFR (Non-African Amer.): >60
Potassium: 3.5 mmol/L (ref 3.5–5.1)
SGPT (ALT): 20 U/L (ref 12–78)
Sodium: 141 mmol/L (ref 136–145)
Total Protein: 7 g/dL (ref 6.4–8.2)

## 2013-02-27 LAB — CBC
HCT: 35.1 % (ref 35.0–47.0)
MCH: 32.1 pg (ref 26.0–34.0)
MCHC: 35.7 g/dL (ref 32.0–36.0)
Platelet: 237 10*3/uL (ref 150–440)
RDW: 12.5 % (ref 11.5–14.5)
WBC: 8.5 10*3/uL (ref 3.6–11.0)

## 2013-02-27 LAB — URINALYSIS, COMPLETE
Bacteria: NONE SEEN
Blood: NEGATIVE
Glucose,UR: NEGATIVE mg/dL (ref 0–75)
Ketone: NEGATIVE
Leukocyte Esterase: NEGATIVE
Ph: 8 (ref 4.5–8.0)
Protein: NEGATIVE
WBC UR: <1 /HPF (ref 0–5)

## 2013-03-28 ENCOUNTER — Encounter (HOSPITAL_COMMUNITY): Payer: Self-pay | Admitting: Emergency Medicine

## 2013-03-28 DIAGNOSIS — Z8742 Personal history of other diseases of the female genital tract: Secondary | ICD-10-CM | POA: Insufficient documentation

## 2013-03-28 DIAGNOSIS — Z9089 Acquired absence of other organs: Secondary | ICD-10-CM | POA: Insufficient documentation

## 2013-03-28 DIAGNOSIS — Z3202 Encounter for pregnancy test, result negative: Secondary | ICD-10-CM | POA: Insufficient documentation

## 2013-03-28 DIAGNOSIS — J45909 Unspecified asthma, uncomplicated: Secondary | ICD-10-CM | POA: Insufficient documentation

## 2013-03-28 DIAGNOSIS — N760 Acute vaginitis: Secondary | ICD-10-CM | POA: Insufficient documentation

## 2013-03-28 DIAGNOSIS — N949 Unspecified condition associated with female genital organs and menstrual cycle: Secondary | ICD-10-CM | POA: Insufficient documentation

## 2013-03-28 DIAGNOSIS — Z8719 Personal history of other diseases of the digestive system: Secondary | ICD-10-CM | POA: Insufficient documentation

## 2013-03-28 DIAGNOSIS — Z79899 Other long term (current) drug therapy: Secondary | ICD-10-CM | POA: Insufficient documentation

## 2013-03-28 DIAGNOSIS — R110 Nausea: Secondary | ICD-10-CM | POA: Insufficient documentation

## 2013-03-28 DIAGNOSIS — F17200 Nicotine dependence, unspecified, uncomplicated: Secondary | ICD-10-CM | POA: Insufficient documentation

## 2013-03-28 LAB — COMPREHENSIVE METABOLIC PANEL
ALT: 15 U/L (ref 0–35)
AST: 17 U/L (ref 0–37)
CO2: 27 mEq/L (ref 19–32)
Chloride: 101 mEq/L (ref 96–112)
GFR calc non Af Amer: 88 mL/min — ABNORMAL LOW (ref >90)
Sodium: 138 mEq/L (ref 135–145)
Total Bilirubin: 0.3 mg/dL (ref 0.3–1.2)

## 2013-03-28 LAB — URINALYSIS, ROUTINE W REFLEX MICROSCOPIC
Bilirubin Urine: NEGATIVE
Hgb urine dipstick: NEGATIVE
Protein, ur: NEGATIVE mg/dL
Urobilinogen, UA: 1 mg/dL (ref 0.0–1.0)

## 2013-03-28 LAB — CBC WITH DIFFERENTIAL/PLATELET
Basophils Absolute: 0 10*3/uL (ref 0.0–0.1)
HCT: 36.5 % (ref 36.0–46.0)
Lymphocytes Relative: 31 % (ref 12–46)
Neutro Abs: 4.9 10*3/uL (ref 1.7–7.7)
Platelets: 263 10*3/uL (ref 150–400)
RDW: 12.6 % (ref 11.5–15.5)
WBC: 8.3 10*3/uL (ref 4.0–10.5)

## 2013-03-28 MED ORDER — OXYCODONE-ACETAMINOPHEN 5-325 MG PO TABS
1.0000 | ORAL_TABLET | Freq: Once | ORAL | Status: AC
  Administered 2013-03-28: 1 via ORAL
  Filled 2013-03-28: qty 1

## 2013-03-28 MED ORDER — ONDANSETRON 4 MG PO TBDP
8.0000 mg | ORAL_TABLET | Freq: Once | ORAL | Status: AC
  Administered 2013-03-28: 8 mg via ORAL
  Filled 2013-03-28: qty 2

## 2013-03-28 NOTE — ED Notes (Signed)
Right lower abdominal pain x1 week. Sudden worsening today. No known cause. NO urinary or menstrual Sx. Nausea present. No v/d

## 2013-03-29 ENCOUNTER — Emergency Department (HOSPITAL_COMMUNITY): Payer: Self-pay

## 2013-03-29 ENCOUNTER — Emergency Department (HOSPITAL_COMMUNITY)
Admission: EM | Admit: 2013-03-29 | Discharge: 2013-03-29 | Disposition: A | Discharge status: Home / Self Care | Payer: Self-pay | Attending: Emergency Medicine | Admitting: Emergency Medicine

## 2013-03-29 DIAGNOSIS — R102 Pelvic and perineal pain: Secondary | ICD-10-CM

## 2013-03-29 DIAGNOSIS — N760 Acute vaginitis: Secondary | ICD-10-CM

## 2013-03-29 LAB — WET PREP, GENITAL
Trich, Wet Prep: NONE SEEN
Yeast Wet Prep HPF POC: NONE SEEN

## 2013-03-29 MED ORDER — METRONIDAZOLE 500 MG PO TABS: 500.0000 mg | ORAL_TABLET | Freq: Two times a day (BID) | ORAL | Status: DC

## 2013-03-29 MED ORDER — HYDROMORPHONE HCL PF 1 MG/ML IJ SOLN
1.0000 mg | INTRAMUSCULAR | Status: AC
  Administered 2013-03-29: 1 mg via INTRAMUSCULAR
  Filled 2013-03-29: qty 1

## 2013-03-29 MED ORDER — KETOROLAC TROMETHAMINE 30 MG/ML IJ SOLN
30.0000 mg | Freq: Once | INTRAMUSCULAR | Status: AC
  Administered 2013-03-29: 30 mg via INTRAVENOUS
  Filled 2013-03-29: qty 1

## 2013-03-29 NOTE — ED Notes (Signed)
The pt has gone to ultra sound

## 2013-03-29 NOTE — ED Notes (Signed)
Patient states her abdominal pain is still a 10.

## 2013-03-29 NOTE — ED Notes (Signed)
Still having pain she returned from [place]

## 2013-03-29 NOTE — ED Provider Notes (Signed)
History    CSN: [id number] Arrival date & time 03/28/13  2209  First MD Initiated Contact with Patient 03/29/13 0019     Chief Complaint  Patient presents with  . Abdominal Pain   HPI Laurie [name] is a 22 y.o. female presents with right lower quadrant pain is been present for one week and has gotten worse with acute onset today. She's had associated nausea, no vomiting. Patient is status post appendectomy by Dr. Daphine Deutscher in June of 2011. Pain is sharp, worse on movement, no diarrhea. Patient has a history of ovarian cysts. Denies any vaginal discharge, vaginal itching or vaginal complaints. Denies to be pregnant. No dysuria or frequency, no back pain, chest pain, shortness of breath, fevers or chills. She has not taken anything for it.  Past Medical History  Diagnosis Date  . Asthma   . Ovarian cyst   . MVP (mitral valve prolapse)   . Gall bladder disease    Past Surgical History  Procedure Laterality Date  . Appendectomy     Family History  Problem Relation Age of Onset  . Anesthesia problems Neg Hx   . Hypertension Maternal Aunt   . Hypertension Paternal Aunt   . Hypertension Maternal Grandmother   . Drug abuse Maternal Grandmother   . Hypertension Maternal Grandfather   . Drug abuse Maternal Grandfather   . Hypertension Paternal Grandmother   . Drug abuse Paternal Grandmother   . Hypertension Paternal Grandfather   . Drug abuse Paternal Grandfather    History  Substance Use Topics  . Smoking status: Current Every Day Smoker -- 0.50 packs/day    Types: Cigarettes  . Smokeless tobacco: Never Used  . Alcohol Use: No   OB History   Grav Para Term Preterm Abortions TAB SAB Ect Mult Living   0 0 0 0 0 0 0 0 0 0      Review of Systems At least 10pt or greater review of systems completed and are negative except where specified in the HPI.  Allergies  Review of patient's allergies indicates no known allergies.  Home Medications   Current Outpatient Rx  Name   Route  Sig  Dispense  Refill  . Acetaminophen (TYLENOL PO)   Oral   Take 1 tablet by mouth once.         Marland Kitchen albuterol (PROVENTIL HFA;VENTOLIN HFA) 108 (90 BASE) MCG/ACT inhaler   Inhalation   Inhale 2 puffs into the lungs every 6 (six) hours as needed for wheezing or shortness of breath.           BP 114/62  Pulse 85  Temp(Src) 98.1 F (36.7 C) (Oral)  Resp 20  SpO2 99% Physical Exam  Nursing notes reviewed.  Electronic medical record reviewed. VITAL SIGNS:   Filed Vitals:   03/28/13 2223  BP: 114/62  Pulse: 85  Temp: 98.1 F (36.7 C)  TempSrc: Oral  Resp: 20  SpO2: 99%   CONSTITUTIONAL: Awake, oriented, appears uncomfortable HENT: Atraumatic, normocephalic, oral mucosa pink and moist, airway patent. Nares patent without drainage. External ears normal. EYES: Conjunctiva clear, EOMI, PERRLA NECK: Trachea midline, non-tender, supple CARDIOVASCULAR: Normal heart rate, Normal rhythm, No murmurs, rubs, gallops PULMONARY/CHEST: Clear to auscultation, no rhonchi, wheezes, or rales. Symmetrical breath sounds. Non-tender. ABDOMINAL: Non-distended, soft, tender to palpation in the right suprapubic region voluntary guarding no rebound tenderness.  BS normal. PELVIC EXAM: normal external genitalia, vulva, vagina, cervix, uterus and adnexa appear normal. Small amount of white discharge in the  vault. Mild tenderness to cervical manipulation. NEUROLOGIC: Non-focal, moving all four extremities, no gross sensory or motor deficits. EXTREMITIES: No clubbing, cyanosis, or edema SKIN: Warm, Dry, No erythema, No rash  ED Course  Procedures (including critical care time) Labs Reviewed  COMPREHENSIVE METABOLIC PANEL - Abnormal; Notable for the following:    Potassium 3.4 (*)    Glucose, Bld 121 (*)    GFR calc non Af Amer 88 (*)    All other components within normal limits  URINALYSIS, ROUTINE W REFLEX MICROSCOPIC - Abnormal; Notable for the following:    APPearance CLOUDY (*)     Leukocytes, UA TRACE (*)    All other components within normal limits  URINE MICROSCOPIC-ADD ON - Abnormal; Notable for the following:    Squamous Epithelial / LPF MANY (*)    Bacteria, UA MANY (*)    All other components within normal limits  CBC WITH DIFFERENTIAL  POCT PREGNANCY, URINE   US Transvaginal Non-ob  03/29/2013   *RADIOLOGY REPORT*  Clinical Data:  Right-sided pelvic pain.  TRANSABDOMINAL AND TRANSVAGINAL ULTRASOUND OF PELVIS DOPPLER ULTRASOUND OF OVARIES  Technique:  Both transabdominal and transvaginal ultrasound examinations of the pelvis were performed. Transabdominal technique was performed for global imaging of the pelvis including uterus, ovaries, adnexal regions, and pelvic cul-de-sac.  It was necessary to proceed with endovaginal exam following the transabdominal exam to visualize the uterus and ovaries.  Color and duplex Doppler ultrasound was utilized to evaluate blood flow to the ovaries.  Comparison:  08/18/2011  FINDINGS  Uterus:  The uterus is anteverted and measures 7.4 x 3.2 x 3.8 cm. No abnormal myometrial mass lesions are demonstrated.  Small Nabothian cyst in the cervix.  Endometrium:  Normal endometrial stripe thickness at 6.5 mm.  No abnormal endometrial fluid collections.  Right ovary: The right ovary measures 4.1 x 3.4 x 3.6 cm.  A simple cyst is demonstrated measuring 2.9 x 2.6 x 2.6 cm.  This is likely a functional cyst and is consistent with a benign finding.  Flow is demonstrated in the right ovary on color flow Doppler imaging.  Left ovary: Left ovary measures 4.2 x 2 x 2.7 cm.  The normal follicular changes are demonstrated.  Flow is demonstrated in the left ovary on color flow Doppler imaging.  No abnormal adnexal masses.  Pulsed Doppler evaluation demonstrates normal low-resistance arterial and venous waveforms in both ovaries.  No free pelvic fluid collections.  IMPRESSION:  Normal exam.  No evidence of pelvic mass or other significant abnormality.  No  sonographic evidence for ovarian torsion.   Original Report Authenticated By: Burman Nieves, M.D.   US Pelvis Complete  03/29/2013   *RADIOLOGY REPORT*  Clinical Data:  Right-sided pelvic pain.  TRANSABDOMINAL AND TRANSVAGINAL ULTRASOUND OF PELVIS DOPPLER ULTRASOUND OF OVARIES  Technique:  Both transabdominal and transvaginal ultrasound examinations of the pelvis were performed. Transabdominal technique was performed for global imaging of the pelvis including uterus, ovaries, adnexal regions, and pelvic cul-de-sac.  It was necessary to proceed with endovaginal exam following the transabdominal exam to visualize the uterus and ovaries.  Color and duplex Doppler ultrasound was utilized to evaluate blood flow to the ovaries.  Comparison:  08/18/2011  FINDINGS  Uterus:  The uterus is anteverted and measures 7.4 x 3.2 x 3.8 cm. No abnormal myometrial mass lesions are demonstrated.  Small Nabothian cyst in the cervix.  Endometrium:  Normal endometrial stripe thickness at 6.5 mm.  No abnormal endometrial fluid collections.  Right ovary: The  right ovary measures 4.1 x 3.4 x 3.6 cm.  A simple cyst is demonstrated measuring 2.9 x 2.6 x 2.6 cm.  This is likely a functional cyst and is consistent with a benign finding.  Flow is demonstrated in the right ovary on color flow Doppler imaging.  Left ovary: Left ovary measures 4.2 x 2 x 2.7 cm.  The normal follicular changes are demonstrated.  Flow is demonstrated in the left ovary on color flow Doppler imaging.  No abnormal adnexal masses.  Pulsed Doppler evaluation demonstrates normal low-resistance arterial and venous waveforms in both ovaries.  No free pelvic fluid collections.  IMPRESSION:  Normal exam.  No evidence of pelvic mass or other significant abnormality.  No sonographic evidence for ovarian torsion.   Original Report Authenticated By: Burman Nieves, M.D.   [place] Art/ven Flow Abd Pelv Doppler  03/29/2013   *RADIOLOGY REPORT*  Clinical Data:  Right-sided pelvic  pain.  TRANSABDOMINAL AND TRANSVAGINAL ULTRASOUND OF PELVIS DOPPLER ULTRASOUND OF OVARIES  Technique:  Both transabdominal and transvaginal ultrasound examinations of the pelvis were performed. Transabdominal technique was performed for global imaging of the pelvis including uterus, ovaries, adnexal regions, and pelvic cul-de-sac.  It was necessary to proceed with endovaginal exam following the transabdominal exam to visualize the uterus and ovaries.  Color and duplex Doppler ultrasound was utilized to evaluate blood flow to the ovaries.  Comparison:  08/18/2011  FINDINGS  Uterus:  The uterus is anteverted and measures 7.4 x 3.2 x 3.8 cm. No abnormal myometrial mass lesions are demonstrated.  Small Nabothian cyst in the cervix.  Endometrium:  Normal endometrial stripe thickness at 6.5 mm.  No abnormal endometrial fluid collections.  Right ovary: The right ovary measures 4.1 x 3.4 x 3.6 cm.  A simple cyst is demonstrated measuring 2.9 x 2.6 x 2.6 cm.  This is likely a functional cyst and is consistent with a benign finding.  Flow is demonstrated in the right ovary on color flow Doppler imaging.  Left ovary: Left ovary measures 4.2 x 2 x 2.7 cm.  The normal follicular changes are demonstrated.  Flow is demonstrated in the left ovary on color flow Doppler imaging.  No abnormal adnexal masses.  Pulsed Doppler evaluation demonstrates normal low-resistance arterial and venous waveforms in both ovaries.  No free pelvic fluid collections.  IMPRESSION:  Normal exam.  No evidence of pelvic mass or other significant abnormality.  No sonographic evidence for ovarian torsion.   Original Report Authenticated By: Burman Nieves, M.D.   1. Pelvic pain   2. Bacterial vaginosis     MDM  Laurie [name] is a 23 y.o. female patient presents with right lower quadrant/ pelvic pain - patient is status post appendectomy. Ultrasound of the patient's pelvis shows no torsion. Patient is afebrile, nontoxic do not think she's got T.  awake, no TOA or free fluid seen on ultrasound.  Patient's pelvic exam is fairly unremarkable, she does not have an increased discharge, there is scant discharge and some mild cervical tenderness, clue cells are seen - will treat the patient with metronidazole, have cautioned her against drinking alcohol secondary to a sulfa reaction to which she replied, "I don't drink."  I do not think this pain is surgical, Chlamydia and gonorrhea samples have been obtained I do not suspect PID at this time, she can be treated at a later time if these tests are positive. Patient understands accepts medical plan as it's been dictated in all of her questions have been answered  to her satisfaction   Jones Skene, MD 03/29/13 [phone]

## 2013-03-29 NOTE — ED Notes (Signed)
Pelvic  Equipment at the bedside

## 2013-03-29 NOTE — ED Notes (Signed)
The pt has had rlq pain for one week no temp nausea no vomiting

## 2013-03-29 NOTE — ED Notes (Signed)
PELVIC PERFORMED BY DR Rulon Abide.  PAIN MED GIVEN

## 2013-03-30 LAB — GC/CHLAMYDIA PROBE AMP
CT Probe RNA: NEGATIVE
GC Probe RNA: NEGATIVE

## 2013-04-05 ENCOUNTER — Emergency Department: Payer: Self-pay | Admitting: Emergency Medicine

## 2013-05-10 ENCOUNTER — Emergency Department (HOSPITAL_COMMUNITY)
Admission: EM | Admit: 2013-05-10 | Discharge: 2013-05-10 | Disposition: A | Discharge status: Home / Self Care | Payer: Self-pay | Attending: Emergency Medicine | Admitting: Emergency Medicine

## 2013-05-10 ENCOUNTER — Encounter (HOSPITAL_COMMUNITY): Payer: Self-pay | Admitting: Adult Health

## 2013-05-10 DIAGNOSIS — F17200 Nicotine dependence, unspecified, uncomplicated: Secondary | ICD-10-CM | POA: Insufficient documentation

## 2013-05-10 DIAGNOSIS — Z8679 Personal history of other diseases of the circulatory system: Secondary | ICD-10-CM | POA: Insufficient documentation

## 2013-05-10 DIAGNOSIS — J029 Acute pharyngitis, unspecified: Secondary | ICD-10-CM | POA: Insufficient documentation

## 2013-05-10 DIAGNOSIS — J45909 Unspecified asthma, uncomplicated: Secondary | ICD-10-CM | POA: Insufficient documentation

## 2013-05-10 DIAGNOSIS — Z79899 Other long term (current) drug therapy: Secondary | ICD-10-CM | POA: Insufficient documentation

## 2013-05-10 DIAGNOSIS — Z8742 Personal history of other diseases of the female genital tract: Secondary | ICD-10-CM | POA: Insufficient documentation

## 2013-05-10 DIAGNOSIS — R6883 Chills (without fever): Secondary | ICD-10-CM | POA: Insufficient documentation

## 2013-05-10 MED ORDER — IBUPROFEN 600 MG PO TABS: 600.0000 mg | ORAL_TABLET | Freq: Four times a day (QID) | ORAL | Status: DC | PRN

## 2013-05-10 NOTE — ED Provider Notes (Signed)
CSN: [id number]     Arrival date & time 05/10/13  1521 History  This chart was scribed for non-physician practitioner Jaynie Crumble, PA-C working with Flint Melter, MD by Valera Castle, ED scribe. This patient was seen in room TR08C/TR08C and the patient's care was started at 3:35 PM.    Chief Complaint  Patient presents with  . Sore Throat    The history is provided by the patient. No language interpreter was used.   HPI Comments: Laurie [name] is a 22 y.o. female who presents to the Emergency Department complaining of constant, worsening sore throat, onset a day and half ago. She reports experiencing a generalized headache and hot flashes. She took advil yesterday along with cold medicine, without relief. She denies being in proximity with anyone who has been sick. Pt denies having h/o mono. She denies fever and rhinorrhea, cough, neck pain  Or stiffness. No other complaints.    Past Medical History  Diagnosis Date  . Asthma   . Ovarian cyst   . MVP (mitral valve prolapse)   . Gall bladder disease    Past Surgical History  Procedure Laterality Date  . Appendectomy     Family History  Problem Relation Age of Onset  . Anesthesia problems Neg Hx   . Hypertension Maternal Aunt   . Hypertension Paternal Aunt   . Hypertension Maternal Grandmother   . Drug abuse Maternal Grandmother   . Hypertension Maternal Grandfather   . Drug abuse Maternal Grandfather   . Hypertension Paternal Grandmother   . Drug abuse Paternal Grandmother   . Hypertension Paternal Grandfather   . Drug abuse Paternal Grandfather    History  Substance Use Topics  . Smoking status: Current Every Day Smoker -- 0.50 packs/day    Types: Cigarettes  . Smokeless tobacco: Never Used  . Alcohol Use: No   OB History   Grav Para Term Preterm Abortions TAB SAB Ect Mult Living   0 0 0 0 0 0 0 0 0 0      Review of Systems  Constitutional: Positive for chills. Negative for fever.  HENT: Positive for sore  throat. Negative for rhinorrhea.   Respiratory: Positive for cough.   Neurological: Positive for headaches.  All other systems reviewed and are negative.    Allergies  Review of patient's allergies indicates no known allergies.  Home Medications   Current Outpatient Rx  Name  Route  Sig  Dispense  Refill  . albuterol (PROVENTIL HFA;VENTOLIN HFA) 108 (90 BASE) MCG/ACT inhaler   Inhalation   Inhale 2 puffs into the lungs every 6 (six) hours as needed for wheezing or shortness of breath.          Marland Kitchen ibuprofen (ADVIL,MOTRIN) 200 MG tablet   Oral   Take 200 mg by mouth every 6 (six) hours as needed for pain.          Triage Vitals: BP 129/70  Pulse 102  Temp(Src) 98.2 F (36.8 C) (Oral)  Resp 20  SpO2 100%  Physical Exam  Nursing note and vitals reviewed. Constitutional: She is oriented to person, place, and time. She appears well-developed and well-nourished.  HENT:  Head: Normocephalic and atraumatic.  Right Ear: Tympanic membrane, external ear and ear canal normal.  Left Ear: Tympanic membrane, external ear and ear canal normal.  Pharynx erythematous. Uvula midline. No exudate. No cervical lymphadenopathy.  Eyes: EOM are normal.  Neck: Normal range of motion. Neck supple.  Cardiovascular: Normal rate, regular rhythm  and normal heart sounds.   Pulmonary/Chest: Effort normal and breath sounds normal. No respiratory distress. She has no wheezes. She has no rales.  Musculoskeletal: Normal range of motion.  Lymphadenopathy:    She has no cervical adenopathy.  Neurological: She is alert and oriented to person, place, and time.  Skin: Skin is warm and dry.  Psychiatric: She has a normal mood and affect. Her behavior is normal.    ED Course  Procedures (including critical care time)  DIAGNOSTIC STUDIES: Oxygen Saturation is 100% on room air, normal by my interpretation.    COORDINATION OF CARE: 3:45 PM-Discussed treatment plan which includes strep test to rule out  strep throat with pt at bedside and pt agreed to plan. Advised pt that her illness could be mono, strep, or some other illness.   4:08 PM Advised pt that her strep test was negative. Will discharge pt with ibuprofen. Pt states she feels ready for discharge.    Labs Review Labs Reviewed  RAPID STREP SCREEN  CULTURE, GROUP A STREP   Imaging Review No results found.  MDM   1. Pharyngitis    Patient with a sore throat for 2 days. Exam shows erythematous pharynx, normal tonsils, uvula midline, no exudate. Streps screen is negative. I do not suspect patient has streptococcal pharyngitis based on the exam. Will not treated emergency department with antibiotics. She is afebrile here. She has no other complaints. Cultures of her strep are sent. Discussed results with patient. Will treat symptomatically with ibuprofen, salt water gargles, Chloraseptic throat spray. She will followup closely with her primary care Dr.  Ceasar Mons Vitals:   05/10/13 1527  BP: 129/70  Pulse: 102  Temp: 98.2 F (36.8 C)  TempSrc: Oral  Resp: 20  SpO2: 100%    I personally performed the services described in this documentation, which was scribed in my presence. The recorded information has been reviewed and is accurate.   Lottie Mussel, PA-C 05/10/13 1615

## 2013-05-10 NOTE — ED Notes (Signed)
Presents with 24 hours of sore throat, redness noted. deneis fevers, reports chills.

## 2013-05-11 NOTE — ED Provider Notes (Signed)
Medical screening examination/treatment/procedure(s) were performed by non-physician practitioner and as supervising physician I was immediately available for consultation/collaboration.  Arkeem Harts L Simren Popson, MD 05/11/13 0049 

## 2013-05-12 LAB — CULTURE, GROUP A STREP

## 2013-05-24 ENCOUNTER — Emergency Department (HOSPITAL_COMMUNITY)
Admission: EM | Admit: 2013-05-24 | Discharge: 2013-05-25 | Disposition: A | Discharge status: Home / Self Care | Payer: Self-pay | Attending: Emergency Medicine | Admitting: Emergency Medicine

## 2013-05-24 ENCOUNTER — Emergency Department (HOSPITAL_COMMUNITY): Payer: Self-pay

## 2013-05-24 DIAGNOSIS — J069 Acute upper respiratory infection, unspecified: Secondary | ICD-10-CM | POA: Insufficient documentation

## 2013-05-24 DIAGNOSIS — R0789 Other chest pain: Secondary | ICD-10-CM | POA: Insufficient documentation

## 2013-05-24 DIAGNOSIS — Z8679 Personal history of other diseases of the circulatory system: Secondary | ICD-10-CM | POA: Insufficient documentation

## 2013-05-24 DIAGNOSIS — Z8719 Personal history of other diseases of the digestive system: Secondary | ICD-10-CM | POA: Insufficient documentation

## 2013-05-24 DIAGNOSIS — Z79899 Other long term (current) drug therapy: Secondary | ICD-10-CM | POA: Insufficient documentation

## 2013-05-24 DIAGNOSIS — Z8742 Personal history of other diseases of the female genital tract: Secondary | ICD-10-CM | POA: Insufficient documentation

## 2013-05-24 DIAGNOSIS — R51 Headache: Secondary | ICD-10-CM | POA: Insufficient documentation

## 2013-05-24 DIAGNOSIS — F17200 Nicotine dependence, unspecified, uncomplicated: Secondary | ICD-10-CM | POA: Insufficient documentation

## 2013-05-24 DIAGNOSIS — J45909 Unspecified asthma, uncomplicated: Secondary | ICD-10-CM | POA: Insufficient documentation

## 2013-05-24 LAB — CBC WITH DIFFERENTIAL/PLATELET
Eosinophils Absolute: 0.1 10*3/uL (ref 0.0–0.7)
Hemoglobin: 13.2 g/dL (ref 12.0–15.0)
Lymphocytes Relative: 16 % (ref 12–46)
Lymphs Abs: 1.6 10*3/uL (ref 0.7–4.0)
MCH: 31.1 pg (ref 26.0–34.0)
MCV: 88.5 fL (ref 78.0–100.0)
Monocytes Relative: 8 % (ref 3–12)
Neutrophils Relative %: 75 % (ref 43–77)
RBC: 4.25 MIL/uL (ref 3.87–5.11)

## 2013-05-24 LAB — POCT I-STAT, CHEM 8
BUN: 7 mg/dL (ref 6–23)
Creatinine, Ser: 1 mg/dL (ref 0.50–1.10)
Glucose, Bld: 104 mg/dL — ABNORMAL HIGH (ref 70–99)
Hemoglobin: 13.6 g/dL (ref 12.0–15.0)
Potassium: 3.7 mEq/L (ref 3.5–5.1)

## 2013-05-24 NOTE — ED Notes (Signed)
Congested, runny nose, rt. Upper side back hurts. Been taking nyquil. And alka seltzer plus. Productive cough, yellowish, greenish

## 2013-05-25 MED ORDER — ALBUTEROL SULFATE HFA 108 (90 BASE) MCG/ACT IN AERS
2.0000 | INHALATION_SPRAY | RESPIRATORY_TRACT | Status: DC | PRN
  Administered 2013-05-25: 2 via RESPIRATORY_TRACT
  Filled 2013-05-25: qty 6.7

## 2013-05-25 MED ORDER — HYDROCODONE-ACETAMINOPHEN 7.5-325 MG/15ML PO SOLN: 15.0000 mL | Freq: Four times a day (QID) | ORAL | Status: DC | PRN

## 2013-05-25 MED ORDER — AZITHROMYCIN 250 MG PO TABS: 250.0000 mg | ORAL_TABLET | Freq: Every day | ORAL | Status: DC

## 2013-05-25 NOTE — ED Notes (Signed)
Pt reports cold symptoms and coughing up yellow-green sputum since this past Sunday. States that she had a fever a couple of days ago, but is afebrile at this time. Reports right rib cage/back pain on inspiration. States that she has been taking dayquil and alka seltzer with no relief. Rates rib cage/back pain 10/10 at this time.

## 2013-05-25 NOTE — ED Provider Notes (Signed)
Medical screening examination/treatment/procedure(s) were performed by non-physician practitioner and as supervising physician I was immediately available for consultation/collaboration.   Brandt Loosen, MD 05/25/13 [phone]

## 2013-05-25 NOTE — ED Provider Notes (Signed)
CSN: [id number]     Arrival date & time 05/24/13  2046 History   First MD Initiated Contact with Patient 05/24/13 2357     Chief Complaint  Patient presents with  . Nasal Congestion  . Cough   (Consider location/radiation/quality/duration/timing/severity/associated sxs/prior Treatment) HPI  22 year old female presents with URI sxs.  Pt reports having nasal congestion, productive cough with yellow sputum, sneezing, runny nose, headache, chest discomfort which has been ongoing for about a week. No complaints of fever, neck stiffness, hemoptysis, nausea, vomiting, diarrhea, abdominal pain, or rash. Patient has tried over-the-counter medication including NyQuil, and Alka-Seltzer plus with minimal improvement. She is a smoker and smoked a pack of cigarettes every 3 days. She denies any significant risk factors for PE or DVT including no recent surgery, prolonged bed rest, history of cancer, unilateral leg swelling, or been on long trip. She has a history of asthma but has not used her inhaler for many years. She does reports mild shortness of breath with prolonged cough  Past Medical History  Diagnosis Date  . Asthma   . Ovarian cyst   . MVP (mitral valve prolapse)   . Gall bladder disease    Past Surgical History  Procedure Laterality Date  . Appendectomy     Family History  Problem Relation Age of Onset  . Anesthesia problems Neg Hx   . Hypertension Maternal Aunt   . Hypertension Paternal Aunt   . Hypertension Maternal Grandmother   . Drug abuse Maternal Grandmother   . Hypertension Maternal Grandfather   . Drug abuse Maternal Grandfather   . Hypertension Paternal Grandmother   . Drug abuse Paternal Grandmother   . Hypertension Paternal Grandfather   . Drug abuse Paternal Grandfather    History  Substance Use Topics  . Smoking status: Current Every Day Smoker -- 0.50 packs/day    Types: Cigarettes  . Smokeless tobacco: Never Used  . Alcohol Use: No   OB History   Grav Para  Term Preterm Abortions TAB SAB Ect Mult Living   0 0 0 0 0 0 0 0 0 0      Review of Systems  All other systems reviewed and are negative.    Allergies  Review of patient's allergies indicates no known allergies.  Home Medications   Current Outpatient Rx  Name  Route  Sig  Dispense  Refill  . albuterol (PROVENTIL HFA;VENTOLIN HFA) 108 (90 BASE) MCG/ACT inhaler   Inhalation   Inhale 2 puffs into the lungs every 6 (six) hours as needed for wheezing or shortness of breath.          Marland Kitchen ibuprofen (ADVIL,MOTRIN) 200 MG tablet   Oral   Take 200 mg by mouth every 6 (six) hours as needed for pain.         Marland Kitchen ibuprofen (ADVIL,MOTRIN) 600 MG tablet   Oral   Take 1 tablet (600 mg total) by mouth every 6 (six) hours as needed for pain.   30 tablet   0    BP 119/83  Pulse 98  Temp(Src) 97.2 F (36.2 C) (Oral)  Resp 16  SpO2 100%  LMP 05/11/2013 Physical Exam  Constitutional: She is oriented to person, place, and time. She appears well-developed and well-nourished. No distress.  HENT:  Head: Normocephalic and atraumatic.  Right Ear: External ear normal.  Left Ear: External ear normal.  Nose: Nose normal.  Mouth/Throat: Oropharynx is clear and moist. No oropharyngeal exudate.  Eyes: Conjunctivae are normal.  Neck:  Normal range of motion. Neck supple.  Cardiovascular: Normal rate and regular rhythm.  Exam reveals no gallop and no friction rub.   No murmur heard. Pulmonary/Chest: Effort normal. No respiratory distress. She has no wheezes.  Abdominal: Soft. Bowel sounds are normal. There is no tenderness.  Musculoskeletal: Normal range of motion.  Neurological: She is alert and oriented to person, place, and time.    ED Course  Procedures (including critical care time)  1:00 AM Patient here with URI symptoms lasting for a week not improving with OTC medication. Since patient is a smoker we'll give Zithromax. Hycet given for symptomatic control. Albuterol given to use as  needed. Smoking cessation discussed. Return precautions discussed. Otherwise chest x-ray shows no evidence of pneumonia and patient is afebrile with stable normal vital signs. She is PERC negative  Labs Review Labs Reviewed  POCT I-STAT, CHEM 8 - Abnormal; Notable for the following:    Glucose, Bld 104 (*)    All other components within normal limits  CBC WITH DIFFERENTIAL   Imaging Review Dg Chest 2 View  05/24/2013   CLINICAL DATA:  Chest pain, shortness of breath and cough. Fever.  EXAM: CHEST  2 VIEW  COMPARISON:  CHEST x-ray 11/29/2012.  FINDINGS: Lung volumes are normal. No consolidative airspace disease. No pleural effusions. No pneumothorax. No pulmonary nodule or mass noted. Pulmonary vasculature and the cardiomediastinal silhouette are within normal limits.  IMPRESSION: 1.  No radiographic evidence of acute cardiopulmonary disease.   Electronically Signed   By: Trudie Reed M.D.   On: 05/24/2013 21:27    MDM   1. URI (upper respiratory infection)    BP 119/83  Pulse 98  Temp(Src) 97.2 F (36.2 C) (Oral)  Resp 16  SpO2 100%  LMP 05/11/2013  I have reviewed nursing notes and vital signs. I personally reviewed the imaging tests through PACS system  I reviewed available ER/hospitalization records thought the EMR     Fayrene Helper, PA-C 05/25/13 0101

## 2013-07-20 IMAGING — NM NM HEPATO W/GB/PHARM/[PERSON_NAME]
2 series · 12 of 12 positions shown · non-contrast
Comparison: None

CLINICAL DATA: Abdominal pain

NUCLEAR MEDICINE HEPATOBILIARY IMAGING WITH GALLBLADDER EF:
TECHNIQUE: Sequential images of the abdomen were obtained for 60
minutes following intravenous administration of
radiopharmaceutical.  Patient then received an infusion of
ugm/kg of CCK analog intravenously over 30 minutes, and imaging was
continued for 30 minutes.  A time-activity curve was generated from
tracer within the gallbladder following CCK administration, and the
gallbladder ejection fraction was calculated.
Radiopharmaceutical:  5 mCi Tc-LLm mebrofenin
Pharmaceutical:  1.5 mcg CCK

[he hepatobiliary · 3.43mm/px · 6 of 48 frames shown (1 of 2)]
[frame 5/48]
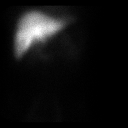
[frame 13/48]
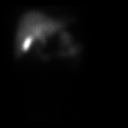
[frame 21/48]
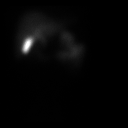
[frame 29/48]
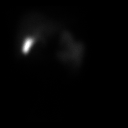
[frame 37/48]
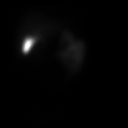
[frame 45/48]
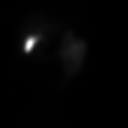

[he hepatobiliary · 3.43mm/px · 6 of 30 frames shown (2 of 2)]
[frame 3/30]
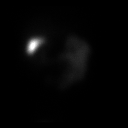
[frame 8/30]
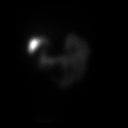
[frame 13/30]
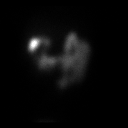
[frame 18/30]
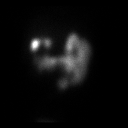
[frame 23/30]
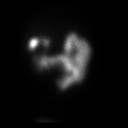
[frame 28/30]
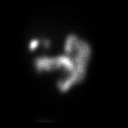

[12 of 12 positions shown; findings below may reference images not displayed]

FINDINGS: Prompt tracer extraction from bloodstream, indicating normal
hepatocellular function.
Prompt excretion of tracer into biliary tree.
Gallbladder visualized at 8 minutes.
Small bowel visualized at 6 minutes.
No hepatic retention of tracer.

Subjectively normal emptying of tracer from gallbladder following
CCK stimulation.
Calculated gallbladder ejection fraction is 80%, normal.
Patient experienced right upper quadrant pressure following CCK
administration.
IMPRESSION: Patent biliary tree.
Normal gallbladder response to CCK stimulation with normal
gallbladder ejection fraction of 80%.
Right upper quadrant pressure following CCK administration.

Normal values for gallbladder ejection fraction:
> 30% for exams utilizing sincalide (CCK)
> 50% for exams utilizing fatty meal stimulation

## 2014-07-02 ENCOUNTER — Encounter (HOSPITAL_COMMUNITY): Payer: Self-pay | Admitting: Emergency Medicine

## 2014-07-02 ENCOUNTER — Emergency Department (HOSPITAL_COMMUNITY)
Admission: EM | Admit: 2014-07-02 | Discharge: 2014-07-02 | Disposition: A | Discharge status: Home / Self Care | Payer: Self-pay | Attending: Emergency Medicine | Admitting: Emergency Medicine

## 2014-07-02 DIAGNOSIS — N63 Unspecified lump in unspecified breast: Secondary | ICD-10-CM

## 2014-07-02 DIAGNOSIS — Z720 Tobacco use: Secondary | ICD-10-CM | POA: Insufficient documentation

## 2014-07-02 MED ORDER — HYDROCODONE-IBUPROFEN 7.5-200 MG PO TABS: 1.0000 | ORAL_TABLET | Freq: Three times a day (TID) | ORAL | Status: DC | PRN

## 2014-07-02 NOTE — Discharge Instructions (Signed)
Your breast mass will need to be evaluated by an OBGYN or breast specialist and likely will need an ultrasound or mammogram. Use tylenol or vicoprofen as directed as needed for pain but don't drive or operate machinery while taking vicoprofen. Return to the ER for changes or worsening symptoms.   Breast Cyst A breast cyst is a sac in the breast that is filled with fluid. Breast cysts are common in women. Women can have one or many cysts. When the breasts contain many cysts, it is usually due to a noncancerous (benign) condition called fibrocystic change. These lumps form under the influence of female hormones (estrogen and progesterone). The lumps are most often located in the upper, outer portion of the breast. They are often more swollen, painful, and tender before your period starts. They usually disappear after menopause, unless you are on hormone therapy.  There are several types of cysts:  Macrocyst. This is a cyst that is about 2 in. (5.1 cm) in diameter.   Microcyst. This is a tiny cyst that you cannot feel but can be seen with a mammogram or an ultrasound.   Galactocele. This is a cyst containing milk that may develop if you suddenly stop breastfeeding.   Sebaceous cyst of the skin. This type of cyst is not in the breast tissue itself. Breast cysts do not increase your risk of breast cancer. However, they must be monitored closely because they can be cancerous.  CAUSES  It is not known exactly what causes a breast cyst to form. Possible causes include:  An overgrowth of milk glands and connective tissue in the breast can block the milk glands, causing them to fill with fluid.   Scar tissue in the breast from previous surgery may block the glands, causing a cyst.  RISK FACTORS Estrogen may influence the development of a breast cyst.  SIGNS AND SYMPTOMS   Feeling a smooth, round, soft lump (like a grape) in the breast that is easily moveable.   Breast discomfort or  pain.  Increase in size of the lump before your menstrual period and decrease in its size after your menstrual period.  DIAGNOSIS  A cyst can be felt during a physical exam by your health care provider. A breast X-ray exam (mammogram) and ultrasonography will be done to confirm the diagnosis. Fluid may be removed from the cyst with a needle (fine needle aspiration) to make sure the cyst is not cancerous.  TREATMENT  Treatment may not be necessary. Your health care provider may monitor the cyst to see if it goes away on its own. If treatment is needed, it may include:  Hormone treatment.   Needle aspiration. There is a chance of the cyst coming back after aspiration.   Surgery to remove the whole cyst.  HOME CARE INSTRUCTIONS   Keep all follow-up appointments with your health care provider.  See your health care provider regularly:  Get a yearly exam by your health care provider.  Have a clinical breast exam by a health care provider every 1-3 years if you are 7-7 years of age. After age 64 years, you should have the exam every year.   Get mammogram tests as directed by your health care provider.   Understand the normal appearance and feel of your breasts and perform breast self-exams.   Only take over-the-counter or prescription medicines as directed by your health care provider.   Wear a supportive bra, especially when exercising.   Avoid caffeine.   Reduce your  salt intake, especially before your menstrual period. Too much salt can cause fluid retention, breast swelling, and discomfort.  SEEK MEDICAL CARE IF:   You feel, or think you feel, a lump in your breast.   You notice that both breasts look or feel different than usual.   Your breast is still causing pain after your menstrual period is over.   You need medicine for breast pain and swelling that occurs with your menstrual period.  SEEK IMMEDIATE MEDICAL CARE IF:   You have severe pain,  tenderness, redness, or warmth in your breast.   You have nipple discharge or bleeding.   Your breast lump becomes hard and painful.   You find new lumps or bumps that were not there before.   You feel lumps in your armpit (axilla).   You notice dimpling or wrinkling of the breast or nipple.   You have a fever.  MAKE SURE YOU:  Understand these instructions.  Will watch your condition.  Will get help right away if you are not doing well or get worse. Document Released: 08/23/2005 Document Revised: 04/25/2013 Document Reviewed: 03/22/2013 [facility] Patient Information 2015 [place], [place]. This information is not intended to replace advice given to you by your health care provider. Make sure you discuss any questions you have with your health care provider.  Fibrocystic Breast Changes Fibrocystic breast changes occur when breast ducts become blocked, causing painful, fluid-filled lumps (cysts) to form in the breast. This is a common condition that is noncancerous (benign). It occurs when women go through hormonal changes during their menstrual cycle. Fibrocystic breast changes can affect one or both breasts. CAUSES  The exact cause of fibrocystic breast changes is not known, but it may be related to the female hormones estrogen and progesterone. Family traits that get passed from parent to child (genetics) may also be a factor in some cases. SIGNS AND SYMPTOMS   Tenderness, mild discomfort, or pain.   Swelling.   Ropelike feeling when touching the breast.   Lumpy breast, one or both sides.   Changes in breast size, especially before (larger) and after (smaller) the menstrual period.   Green or dark brown nipple discharge (not blood).  Symptoms are usually worse before menstrual periods start and get better toward the end of the menstrual period.  DIAGNOSIS  To make a diagnosis, your health care provider will ask you questions and perform a physical exam of your  breasts. The health care provider may recommend other tests that can examine inside your breasts, such as:  A breast X-ray (mammogram).   Ultrasonography.  An MRI.  If something more than fibrocystic breast changes is suspected, your health care provider may take a breast tissue sample (breast biopsy) to examine. TREATMENT  Often, treatment is not needed. Your health care provider may recommend over-the-counter pain relievers to help lessen pain or discomfort caused by the fibrocystic breast changes. You may also be asked to change your diet to limit or stop eating foods or drinking beverages that contain caffeine. Foods and beverages that contain caffeine include chocolate, soda, coffee, and tea. Reducing sugar and fat in your diet may also help. Your health care provider may also recommend:  Fine needle aspiration to remove fluid from a cyst that is causing pain.   Surgery to remove a large, persistent, and tender cyst. HOME CARE INSTRUCTIONS   Examine your breasts after every menstrual period. If you do not have menstrual periods, check your breasts the first day of  every month. Feel for changes, such as more tenderness, a new growth, a change in breast size, or a change in a lump that has always been there.   Only take over-the-counter or prescription medicine as directed by your health care provider.   Wear a well-fitted support or sports bra, especially when exercising.   Decrease or avoid caffeine, fat, and sugar in your diet as directed by your health care provider.  SEEK MEDICAL CARE IF:   You have fluid leaking (discharge) from your nipples, especially bloody discharge.   You have new lumps or bumps in the breast.   Your breast or breasts become enlarged, red, and painful.   You have areas of your breast that pucker in.   Your nipples appear flat or indented.  Document Released: 06/09/2006 Document Revised: 08/28/2013 Document Reviewed: 02/11/2013 [facility]  Patient Information 2015 [place], [place]. This information is not intended to replace advice given to you by your health care provider. Make sure you discuss any questions you have with your health care provider.  Fibroadenoma A fibroadenoma is a small, round, rubbery lump (tumor). The lump is not cancer. It often does not cause pain. It may move slightly when you touch it. This kind of lump can grow in one or both breasts. HOME CARE  Check your lump often for any changes.  Keep all follow-up exams and mammograms. GET HELP RIGHT AWAY IF:  The lump changes in size.  The lump becomes tender and painful.  You have fluid coming from your nipple. MAKE SURE YOU:  Understand these instructions.  Will watch your condition.  Will get help right away if you are not doing well or get worse. Document Released: 11/19/2008 Document Revised: 11/15/2011 Document Reviewed: 11/19/2008 [facility] Patient Information 2015 [place], [place]. This information is not intended to replace advice given to you by your health care provider. Make sure you discuss any questions you have with your health care provider.  Breast Self-Awareness Breast self-awareness allows you to notice a breast problem early while it is still small. Do a breast self-exam:  Every month, 5-7 days after your period (menstrual period).  At the same time each month if you do not have periods anymore. Look for any:  Difference between your breasts (size, shape, or position).  Change in breast shape or size.  Fluid or blood coming from your nipples.  Changes in your nipples (dimpling, nipple movement).   Change in skin color or texture (redness, scaly areas). Feel for:  Lumps.  Bumps.  Dips.  Any other changes. HOW TO DO A BREAST SELF-EXAM Look at your breasts and nipples. 1. Take off all your clothes above your waist. 2. Stand in front of a mirror in a room with good lighting. 3. Put your hands on your hips and push your  hands downward. Feel your breasts.  1. Lie flat on your back or stand in the shower or tub. If you are in the shower or tub, have wet, soapy hands. 2. Place your right arm above your head. 3. Place your left hand in the right underarm area. 4. Make small circles using the pads (not the fingertips) of your 3 middle fingers. Press lightly and then with medium and firm pressure. 5. Move your fingers a little lower and make the small circles at the 3 pressures (light, medium, and firm). 6. Continue moving your fingers lower and making circles until you reach the bottom of your breast. 7. Move your fingers one finger-width towards  the center of the body. 8. Continue making the circles, this time moving upward until you reach the bottom of your neck. 9. Move your fingers one finger-width towards the center of your body. 10. Make circles downward when starting at the bottom of the neck. Make circles upward when starting at the bottom of the breast. Stop when you reach the middle of the chest. 11.  Repeat these steps on the other breast. Write down what looks and feels normal for each breast. Also write down any changes you notice. GET HELP RIGHT AWAY IF:  You see any changes in your breasts or nipples.  You see skin changes.  You have unusual discharge from your nipples.  You feel a new lump.  You feel unusually thick areas. Document Released: 02/09/2008 Document Revised: 08/09/2012 Document Reviewed: 12/08/2011 [facility] Patient Information 2015 [place], [place]. This information is not intended to replace advice given to you by your health care provider. Make sure you discuss any questions you have with your health care provider.

## 2014-07-02 NOTE — ED Provider Notes (Signed)
CSN: [id number]     Arrival date & time 07/02/14  1542 History   First MD Initiated Contact with Patient 07/02/14 1714     Chief Complaint  Patient presents with  . Abscess     (Consider location/radiation/quality/duration/timing/severity/associated sxs/prior Treatment) HPI Comments: Laurie RakersDesiree [name] is a 23 y.o. female with a FHx of MRSA and breast cancer but no significant PMHx, who presents to the ED with complaints of an "abscess" under her R breast that she noticed in April but over the last week she noticed it was larger and more painful. States in April it was the size of a BB pellet and nonpainful. Last week she noticed it had grown in size, approx the size of a marble at this time, and has become slightly more painful, describing the pain as 8/10 intermittent sharp pain that feels as though it travels along the skin into her axilla and along her rib cage, worse with palpation and movement of the area, and unrelieved by warm compresses tylenol and ibuprofen. She is worried because her family has a strong hx of breast cancer. Denies any erythema, warmth, red streaking, drainage, nipple retraction/discharge, fevers/chills, CP, SOB, abd pain, n/v/d/c, myalgias, arthralgias, paresthesias, or weakness. Smokes 1/2ppd. Denies IV drug use or skin injury/insect bite to this area. No other masses or swelling in breast or axilla. Does not see any PCP/OBGYN due to lack of insurance, and does not perform monthly breast exams.   Patient is a 23 y.o. female presenting with abscess. The history is provided by the patient. No language interpreter was used.  Abscess Location:  Torso Torso abscess location:  R chest Size:  Marble Abscess quality: painful   Abscess quality: not draining, no fluctuance, no induration, no itching, no redness, no warmth and not weeping   Red streaking: no   Duration:  6 months Progression:  Worsening Pain details:    Quality:  Sharp   Severity:  Moderate (8/10)   Duration:   6 months   Timing:  Intermittent   Progression:  Worsening Chronicity:  Recurrent Context: not diabetes, not immunosuppression, not injected drug use, not insect bite/sting and not skin injury   Relieved by:  Nothing Exacerbated by: palpation. Ineffective treatments:  Warm compresses and NSAIDs Associated symptoms: no anorexia, no fatigue, no fever, no headaches, no nausea and no vomiting   Risk factors: family hx of MRSA   Risk factors: no hx of MRSA and no prior abscess     History reviewed. No pertinent past medical history. Past Surgical History  Procedure Laterality Date  . Appendectomy     No family history on file. History  Substance Use Topics  . Smoking status: Current Every Day Smoker -- 0.50 packs/day    Types: Cigarettes  . Smokeless tobacco: Not on file  . Alcohol Use: Yes     Comment: socially.    OB History   Grav Para Term Preterm Abortions TAB SAB Ect Mult Living                 Review of Systems  Constitutional: Negative for fever and fatigue.  Respiratory: Negative for shortness of breath.   Cardiovascular: Negative for chest pain.  Gastrointestinal: Negative for nausea, vomiting, abdominal pain, diarrhea, constipation and anorexia.  Musculoskeletal: Negative for arthralgias and myalgias.  Skin: Negative for color change and rash.       +mass under R breast  Allergic/Immunologic: Negative for immunocompromised state.  Neurological: Negative for weakness, numbness and headaches.  Hematological: Negative for adenopathy.   10 Systems reviewed and are negative for acute change except as noted in the HPI.    Allergies  Review of patient's allergies indicates no known allergies.  Home Medications   Prior to Admission medications   Not on File   BP 131/75  Pulse 84  Temp(Src) 98.2 F (36.8 C) (Oral)  Resp 18  SpO2 100%  LMP 06/11/2014 Physical Exam  Nursing note and vitals reviewed. Constitutional: She is oriented to person, place, and  time. Vital signs are normal. She appears well-developed and well-nourished. No distress.  Afebrile, nontoxic, NAD  HENT:  Head: Normocephalic and atraumatic.  Mouth/Throat: Oropharynx is clear and moist and mucous membranes are normal.  Eyes: Conjunctivae and EOM are normal. Right eye exhibits no discharge. Left eye exhibits no discharge.  Neck: Normal range of motion. Neck supple.  Cardiovascular: Normal rate and intact distal pulses.   Pulmonary/Chest: Effort normal. No respiratory distress. She exhibits tenderness. Right breast exhibits mass and tenderness. Right breast exhibits no inverted nipple, no nipple discharge and no skin change.    Small well circumscribed 5mm nodular area just under R breast in midaxillary line, which is mildly TTP, with no erythema, induration, or warmth. No nipple inversion or discharge, no skin changes to remainder of breast, no axillary LAD, no breast asymmetry  Abdominal: Normal appearance. She exhibits no distension.  Musculoskeletal: Normal range of motion.  Lymphadenopathy:    She has no axillary adenopathy.  Neurological: She is alert and oriented to person, place, and time.  Skin: Skin is warm, dry and intact. No rash noted. No erythema.  Psychiatric: She has a normal mood and affect.    ED Course  Procedures (including critical care time) Labs Review Labs Reviewed - No data to display  Imaging Review No results found.   EKG Interpretation None      MDM   Final diagnoses:  Breast mass in female    23y/o female with small <1cm mass just under R breast. Does not appear to be an abscess. Area somewhat well circumscribed and nodular, does not feel cystic. Discussed that ultrasound would likely need to be performed and potentially could need biopsy. Likely fibroadenoma although could be a lymph node vs lipoma vs other mass. Referral given to women's clinic for ongoing care. Will give vicoprofen and tylenol for pain. Discussed s/sx of  infection that would prompt her to be seen in ER. I explained the diagnosis and have given explicit precautions to return to the ER including for any other new or worsening symptoms. The patient understands and accepts the medical plan as it's been dictated and I have answered their questions. Discharge instructions concerning home care and prescriptions have been given. The patient is STABLE and is discharged to home in good condition.  BP 131/75  Pulse 84  Temp(Src) 98.2 F (36.8 C) (Oral)  Resp 18  SpO2 100%  LMP 06/11/2014  Meds ordered this encounter  Medications  . HYDROcodone-ibuprofen (VICOPROFEN) 7.5-200 MG per tablet    Sig: Take 1 tablet by mouth every 8 (eight) hours as needed for moderate pain or severe pain.    Dispense:  15 tablet    Refill:  0    Order Specific Question:  Supervising Provider    Answer:  Vida RollerMILLER, BRIAN D [address][id number]]       Laurie Gravatt Strupp Camprubi-Soms, PA-C 07/02/14 1744

## 2014-07-02 NOTE — ED Provider Notes (Signed)
Medical screening examination/treatment/procedure(s) were performed by non-physician practitioner and as supervising physician I was immediately available for consultation/collaboration.   EKG Interpretation None        Gilda Creasehristopher J. Cyanne Delmar, MD 07/02/14 2147

## 2014-07-02 NOTE — ED Notes (Addendum)
Pt reports "knot" under right breast since April. States it has progressively gotten bigger. Painful on palpation. Denies drainage. Pt in NAD.

## 2014-07-21 ENCOUNTER — Encounter (HOSPITAL_COMMUNITY): Payer: Self-pay | Admitting: Adult Health

## 2014-07-23 ENCOUNTER — Other Ambulatory Visit (HOSPITAL_COMMUNITY): Payer: Self-pay | Admitting: *Deleted

## 2014-07-23 DIAGNOSIS — N6310 Unspecified lump in the right breast, unspecified quadrant: Secondary | ICD-10-CM

## 2014-08-08 ENCOUNTER — Other Ambulatory Visit: Payer: Self-pay

## 2014-08-08 ENCOUNTER — Ambulatory Visit (HOSPITAL_COMMUNITY): Payer: Self-pay | Attending: Obstetrics and Gynecology

## 2014-09-18 ENCOUNTER — Ambulatory Visit: Payer: Self-pay | Admitting: Internal Medicine

## 2014-11-05 ENCOUNTER — Ambulatory Visit: Payer: Self-pay | Admitting: Internal Medicine

## 2015-05-07 ENCOUNTER — Ambulatory Visit: Payer: Self-pay | Admitting: Primary Care

## 2015-05-15 ENCOUNTER — Ambulatory Visit: Payer: Self-pay | Admitting: Primary Care

## 2015-05-27 ENCOUNTER — Ambulatory Visit: Payer: Self-pay | Admitting: Primary Care

## 2015-06-25 ENCOUNTER — Emergency Department
Admission: EM | Admit: 2015-06-25 | Discharge: 2015-06-25 | Disposition: A | Discharge status: Home / Self Care | Payer: 59 | Attending: Emergency Medicine | Admitting: Emergency Medicine

## 2015-06-25 ENCOUNTER — Encounter: Payer: Self-pay | Admitting: *Deleted

## 2015-06-25 DIAGNOSIS — Z720 Tobacco use: Secondary | ICD-10-CM | POA: Insufficient documentation

## 2015-06-25 DIAGNOSIS — Z79899 Other long term (current) drug therapy: Secondary | ICD-10-CM | POA: Insufficient documentation

## 2015-06-25 DIAGNOSIS — Z792 Long term (current) use of antibiotics: Secondary | ICD-10-CM | POA: Insufficient documentation

## 2015-06-25 DIAGNOSIS — IMO0002 Reserved for concepts with insufficient information to code with codable children: Secondary | ICD-10-CM

## 2015-06-25 DIAGNOSIS — M659 Synovitis and tenosynovitis, unspecified: Secondary | ICD-10-CM | POA: Insufficient documentation

## 2015-06-25 MED ORDER — TRAMADOL HCL 50 MG PO TABS
50.0000 mg | ORAL_TABLET | Freq: Once | ORAL | Status: AC
  Administered 2015-06-25: 50 mg via ORAL
  Filled 2015-06-25: qty 1

## 2015-06-25 MED ORDER — TRAMADOL HCL 50 MG PO TABS: 50.0000 mg | ORAL_TABLET | Freq: Four times a day (QID) | ORAL | Status: DC | PRN

## 2015-06-25 MED ORDER — NAPROXEN 500 MG PO TABS
500.0000 mg | ORAL_TABLET | Freq: Two times a day (BID) | ORAL | Status: DC
Start: 2015-06-25 — End: 2017-08-10

## 2015-06-25 MED ORDER — NAPROXEN 500 MG PO TABS
500.0000 mg | ORAL_TABLET | Freq: Once | ORAL | Status: AC
  Administered 2015-06-25: 500 mg via ORAL
  Filled 2015-06-25: qty 1

## 2015-06-25 NOTE — ED Provider Notes (Signed)
[facility] Emergency Department Provider Note  ____________________________________________  Time seen: Approximately 2:24 PM  I have reviewed the triage vital signs and the nursing notes.   HISTORY  Chief Complaint Wrist Pain    HPI Laurie [name] is a 24 y.o. female patient complaining of left wrist and thumb pain for 3 days. 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 Patient denies any stain provocative incident except for waking up with this pain 3 days ago. Patient state increased pain with abduction and extension of the left thumb. Patient is some mild edema to the wrist and is tender palpation at the MCP. Patient rates the pain as a 8/10. Patient describes the pain as sharp. No palliative measures taken for this complaint.Patient is right-hand dominant.   Past Medical History  Diagnosis Date  . Asthma   . Ovarian cyst   . MVP (mitral valve prolapse)   . Gall bladder disease     There are no active problems to display for this patient.   Past Surgical History  Procedure Laterality Date  . Appendectomy      Current Outpatient Rx  Name  Route  Sig  Dispense  Refill  . albuterol (PROVENTIL HFA;VENTOLIN HFA) 108 (90 BASE) MCG/ACT inhaler   Inhalation   Inhale 2 puffs into the lungs every 6 (six) hours as needed for wheezing or shortness of breath.          Marland Kitchen azithromycin (ZITHROMAX) 250 MG tablet   Oral   Take 1 tablet (250 mg total) by mouth daily.   4 tablet   0   . HYDROcodone-acetaminophen (HYCET) 7.5-325 mg/15 ml solution   Oral   Take 15 mLs by mouth 4 (four) times daily as needed for pain or cough.   120 mL   0   . HYDROcodone-ibuprofen (VICOPROFEN) 7.5-200 MG per tablet   Oral   Take 1 tablet by mouth every 8 (eight) hours as needed for moderate pain or severe pain.   15 tablet   0   . ibuprofen (ADVIL,MOTRIN) 200 MG tablet   Oral   Take 200 mg by mouth every 6 (six) hours as needed for pain.            Allergies Review of patient's allergies indicates no known allergies.  Family History  Problem Relation Age of Onset  . Anesthesia problems Neg Hx   . Hypertension Maternal Aunt   . Hypertension Paternal Aunt   . Hypertension Maternal Grandmother   . Drug abuse Maternal Grandmother   . Hypertension Maternal Grandfather   . Drug abuse Maternal Grandfather   . Hypertension Paternal Grandmother   . Drug abuse Paternal Grandmother   . Hypertension Paternal Grandfather   . Drug abuse Paternal Grandfather     Social History Social History  Substance Use Topics  . Smoking status: Current Every Day Smoker -- 0.50 packs/day    Types: Cigarettes  . Smokeless tobacco: None  . Alcohol Use: Yes     Comment: socially.     Review of Systems Constitutional: No fever/chills Eyes: No visual changes. ENT: No sore throat. Cardiovascular: Denies chest pain. Respiratory: Denies shortness of breath. Gastrointestinal: No abdominal pain.  No nausea, no vomiting.  No diarrhea.  No constipation. Genitourinary: Negative for dysuria. Musculoskeletal: Negative for back pain. Skin: Negative for rash. Neurological: Negative for headaches, focal weakness or numbness. 10-point ROS otherwise negative.  ____________________________________________   PHYSICAL EXAM:  VITAL SIGNS: ED Triage Vitals  Enc  Vitals Group     BP 06/25/15 1354 148/71 mmHg     Pulse Rate 06/25/15 1354 105     Resp 06/25/15 1354 16     Temp 06/25/15 1354 98.1 F (36.7 C)     Temp Source 06/25/15 1354 Oral     SpO2 06/25/15 1354 96 %     Weight --      Height --      Head Cir --      Peak Flow --      Pain Score 06/25/15 1353 8     Pain Loc --      Pain Edu? --      Excl. in GC? --     Constitutional: Alert and oriented. Well appearing and in no acute distress. Eyes: Conjunctivae are normal. PERRL. EOMI. Head: Atraumatic. Nose: No congestion/rhinnorhea. Mouth/Throat: Mucous membranes are moist.  Oropharynx  non-erythematous. Neck: No stridor. No cervical spine tenderness to palpation. Hematological/Lymphatic/Immunilogical: No cervical lymphadenopathy. Cardiovascular: Normal rate, regular rhythm. Grossly normal heart sounds.  Good peripheral circulation. Respiratory: Normal respiratory effort.  No retractions. Lungs CTAB. Gastrointestinal: Soft and nontender. No distention. No abdominal bruits. No CVA tenderness. Musculoskeletal: No obvious edema to the right wrist thumb. Decreased range of motion with abduction and extension with his back complaining of pain.  Neurologic:  Normal speech and language. No gross focal neurologic deficits are appreciated. No gait instability. Skin:  Skin is warm, dry and intact. No rash noted. Psychiatric: Mood and affect are normal. Speech and behavior are normal.  ____________________________________________   LABS (all labs ordered are listed, but only abnormal results are displayed)  Labs Reviewed - No data to display ____________________________________________  EKG   ____________________________________________  RADIOLOGY   ____________________________________________   PROCEDURES  Procedure(s) performed: None  Critical Care performed: No  ____________________________________________   INITIAL IMPRESSION / ASSESSMENT AND PLAN / ED COURSE  Pertinent labs & imaging results that were available during my care of the patient were reviewed by me and considered in my medical decision making (see chart for details).   ___________Tenosynovitis left thumb/wrist.______ patient placed in a thumb spica splint and advised wear for 7-10 days. Patient given a prescription for naproxen take as directed. Patient also given prescription for tramadol to take only for acute pain. Patient advised follow-up with the family doctor if condition persists. ___________________________   FINAL CLINICAL IMPRESSION(S) / ED DIAGNOSES  Final diagnoses:   Tenosynovitis of finger, hand, or wrist      Joni ReiningRonald K Kale Dols, PA-C 06/25/15 1433  Darien Ramusavid W Kaminski, MD 06/25/15 1531

## 2015-06-25 NOTE — Discharge Instructions (Signed)
Wear splint for 7-10 days.

## 2015-06-25 NOTE — ED Notes (Signed)
Pt woke up in sleep with left wrist pain. No known injury. No swelling noted.

## 2015-07-03 ENCOUNTER — Encounter: Payer: Self-pay | Admitting: Primary Care

## 2015-07-03 ENCOUNTER — Ambulatory Visit (INDEPENDENT_AMBULATORY_CARE_PROVIDER_SITE_OTHER): Payer: 59 | Admitting: Primary Care

## 2015-07-03 VITALS — BP 132/78 | HR 88 | Temp 98.0°F | Ht 67.25 in | Wt 147.1 lb

## 2015-07-03 DIAGNOSIS — M659 Synovitis and tenosynovitis, unspecified: Secondary | ICD-10-CM

## 2015-07-03 MED ORDER — PREDNISONE 20 MG PO TABS: ORAL_TABLET | ORAL | Status: DC

## 2015-07-03 NOTE — Progress Notes (Signed)
Pre visit review using our clinic review tool, if applicable. No additional management support is needed unless otherwise documented below in the visit note. 

## 2015-07-03 NOTE — Patient Instructions (Signed)
Start Prednisone tablets for inflammation. Take 3 tablets by mouth daily for 3 days, then 2 tablets daily for 3 days, then 1 tablet daily for 3 days.  Do not take Naproxen while taking the prednisone. You may take the Tramadol as needed for pain.  Please call me if no improvement in 3-4 days.  Please schedule a physical with me in the next 3 months.   It was a pleasure to meet you today! Please don't hesitate to call me with any questions. Welcome to [facility]!

## 2015-07-03 NOTE — Assessment & Plan Note (Signed)
Diagnosed in ED on 10/19. No improvement in pain with wrist splint, tramadol, naproxen. Decreased ROM to left wrist and thumb due to pain. Tender upon palpation. 2+ radial pulses. Skin color WNL. RX for prednisone taper provided. Discussed splint use and told hold naproxen while on prednisone. Follow up if no improvement in the next 3-4 days.

## 2015-07-03 NOTE — Progress Notes (Signed)
Subjective:    Patient ID: Laurie [name], female    DOB: 1990-10-12, 24 y.o.   MRN: [id number]  HPI  Laurie [name] is a 24 year old female who presents today to establish care and discuss the problems mentioned below. Will obtain old records.  1) Hospital Follow Up: Presented to [facility] on 10/19 with a chief complaint of left wrist and thumb pain for the previous 3 days (starting October 15th). 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 She did not have any recent trauma or injury. She was diagnosed with tendosynovitis. She was provided with a splint and prescriptions for Tramadol and Naproxen.   Since initiation of medication and wrist splint she has not had any improvement in pain. She's since noticed some numbness.tingling to her left digits on Sunday which has since dissipated. Her symptoms become worse with wrist or thumb movement. She's hardly able to move her wrist without the splint.  Review of Systems  Constitutional: Negative for unexpected weight change.  HENT: Negative for rhinorrhea.   Respiratory: Negative for cough and shortness of breath.   Cardiovascular: Negative for chest pain.  Gastrointestinal: Negative for diarrhea and constipation.  Genitourinary: Negative for difficulty urinating.       Regular periods  Musculoskeletal: Positive for arthralgias.  Skin: Negative for rash.  Neurological: Positive for numbness. Negative for dizziness and headaches.  Psychiatric/Behavioral:       Denies concerns for anxiety and depression       Past Medical History  Diagnosis Date  . Asthma   . Ovarian cyst   . MVP (mitral valve prolapse)   . Gall bladder disease   . Arthritis   . GERD (gastroesophageal reflux disease)   . Migraine     Social History   Social History  . Marital Status: Single    Spouse Name: N/A  . Number of Children: N/A  . Years of Education: N/A   Occupational History  . Not on file.   Social History Main Topics    . Smoking status: Current Every Day Smoker -- 0.50 packs/day    Types: Cigarettes  . Smokeless tobacco: Not on file  . Alcohol Use: No  . Drug Use: No  . Sexual Activity: Yes    Birth Control/ Protection: None   Other Topics Concern  . Not on file   Social History Narrative   Single. No children.   Works at [employer] station.   Enjoys being outdoors, going to parks.        Past Surgical History  Procedure Laterality Date  . Appendectomy  2010  . Closed reduction elbow dislocation      Family History  Problem Relation Age of Onset  . Anesthesia problems Neg Hx   . Hypertension Maternal Aunt   . Hypertension Paternal Aunt   . Hypertension Maternal Grandmother   . Drug abuse Maternal Grandmother   . Hypertension Maternal Grandfather   . Drug abuse Maternal Grandfather   . Hypertension Paternal Grandmother   . Drug abuse Paternal Grandmother   . Hypertension Paternal Grandfather   . Drug abuse Paternal Grandfather     No Known Allergies  Current Outpatient Prescriptions on File Prior to Visit  Medication Sig Dispense Refill  . albuterol (PROVENTIL HFA;VENTOLIN HFA) 108 (90 BASE) MCG/ACT inhaler Inhale 2 puffs into the lungs every 6 (six) hours as needed for wheezing or shortness of breath.     Marland Kitchen  naproxen (NAPROSYN) 500 MG tablet Take 1 tablet (500 mg total) by mouth 2 (two) times daily with a meal. 20 tablet 0  . traMADol (ULTRAM) 50 MG tablet Take 1 tablet (50 mg total) by mouth every 6 (six) hours as needed for moderate pain. 12 tablet 0  . ibuprofen (ADVIL,MOTRIN) 200 MG tablet Take 200 mg by mouth every 6 (six) hours as needed for pain.     No current facility-administered medications on file prior to visit.    BP 132/78 mmHg  Pulse 88  Temp(Src) 98 F (36.7 C) (Oral)  Ht 5' 7.25" (1.708 m)  Wt 147 lb 1.9 oz (66.733 kg)  BMI 22.88 kg/m2  SpO2 99%  LMP 06/25/2015    Objective:   Physical Exam  Constitutional: She is oriented to person,  place, and time. She appears well-nourished.  Cardiovascular: Normal rate and regular rhythm.   Pulmonary/Chest: Effort normal and breath sounds normal.  Musculoskeletal:       Right wrist: She exhibits decreased range of motion, tenderness and swelling. She exhibits no bony tenderness, no crepitus and no deformity.  Neurological: She is alert and oriented to person, place, and time.  Skin: Skin is warm and dry. No erythema.          Assessment & Plan:

## 2017-08-09 ENCOUNTER — Encounter: Payer: Self-pay | Admitting: *Deleted

## 2017-08-09 DIAGNOSIS — Z3480 Encounter for supervision of other normal pregnancy, unspecified trimester: Secondary | ICD-10-CM | POA: Insufficient documentation

## 2017-08-10 ENCOUNTER — Encounter: Payer: Self-pay | Admitting: Obstetrics & Gynecology

## 2017-08-10 ENCOUNTER — Ambulatory Visit (INDEPENDENT_AMBULATORY_CARE_PROVIDER_SITE_OTHER): Payer: Self-pay | Admitting: Obstetrics & Gynecology

## 2017-08-10 VITALS — BP 113/65 | HR 76 | Wt 161.0 lb

## 2017-08-10 DIAGNOSIS — Z124 Encounter for screening for malignant neoplasm of cervix: Secondary | ICD-10-CM

## 2017-08-10 DIAGNOSIS — Z3481 Encounter for supervision of other normal pregnancy, first trimester: Secondary | ICD-10-CM

## 2017-08-10 DIAGNOSIS — Z3480 Encounter for supervision of other normal pregnancy, unspecified trimester: Secondary | ICD-10-CM

## 2017-08-10 DIAGNOSIS — Z113 Encounter for screening for infections with a predominantly sexual mode of transmission: Secondary | ICD-10-CM

## 2017-08-10 NOTE — Patient Instructions (Addendum)
Thank you for enrolling in MyChart. Please follow the instructions below to securely access your online medical record. MyChart allows you to send messages to your doctor, view your test results, manage appointments, and more.   How Do I Sign Up? 1. In your Internet browser, go to [employer] and enter [email or web address]. 2. Click on the Sign Up Now link in the Sign In box. You will see the New Member Sign Up page. 3. Enter your MyChart Access Code exactly as it appears below. You will not need to use this code after you've completed the sign-up process. If you do not sign up before the expiration date, you must request a new code.  MyChart Access Code: [email or web address] Expires: 09/24/2017 11:05 AM  4. Enter your Social Security Number ([id number]) and Date of Birth (mm/dd/yyyy) as indicated and click Submit. You will be taken to the next sign-up page. 5. Create a MyChart ID. This will be your MyChart login ID and cannot be changed, so think of one that is secure and easy to remember. 6. Create a MyChart password. You can change your password at any time. 7. Enter your Password Reset Question and Answer. This can be used at a later time if you forget your password.  8. Enter your e-mail address. You will receive e-mail notification when new information is available in MyChart. 9. Click Sign Up. You can now view your medical record.   Additional Information Remember, MyChart is NOT to be used for urgent needs. For medical emergencies, dial 911.     First Trimester of Pregnancy The first trimester of pregnancy is from week 1 until the end of week 13 (months 1 through 3). A week after a sperm fertilizes an egg, the egg will implant on the wall of the uterus. This embryo will begin to develop into a baby. Genes from you and your partner will form the baby. The female genes will determine whether the baby will be a boy or a girl. At 6-8 weeks, the eyes and face will be formed, and  the heartbeat can be seen on ultrasound. At the end of 12 weeks, all the baby's organs will be formed. Now that you are pregnant, you will want to do everything you can to have a healthy baby. Two of the most important things are to get good prenatal care and to follow your health care provider's instructions. Prenatal care is all the medical care you receive before the baby's birth. This care will help prevent, find, and treat any problems during the pregnancy and childbirth. Body changes during your first trimester Your body goes through many changes during pregnancy. The changes vary from woman to woman.  You may gain or lose a couple of pounds at first.  You may feel sick to your stomach (nauseous) and you may throw up (vomit). If the vomiting is uncontrollable, call your health care provider.  You may tire easily.  You may develop headaches that can be relieved by medicines. All medicines should be approved by your health care provider.  You may urinate more often. Painful urination may mean you have a bladder infection.  You may develop heartburn as a result of your pregnancy.  You may develop constipation because certain hormones are causing the muscles that push stool through your intestines to slow down.  You may develop hemorrhoids or swollen veins (varicose veins).  Your breasts may begin to grow larger and become tender. Your nipples may stick out more, and the  tissue that surrounds them (areola) may become darker.  Your gums may bleed and may be sensitive to brushing and flossing.  Dark spots or blotches (chloasma, mask of pregnancy) may develop on your face. This will likely fade after the baby is born.  Your menstrual periods will stop.  You may have a loss of appetite.  You may develop cravings for certain kinds of food.  You may have changes in your emotions from day to day, such as being excited to be pregnant or being concerned that something may go wrong with the  pregnancy and baby.  You may have more vivid and strange dreams.  You may have changes in your hair. These can include thickening of your hair, rapid growth, and changes in texture. Some women also have hair loss during or after pregnancy, or hair that feels dry or thin. Your hair will most likely return to normal after your baby is born.  What to expect at prenatal visits During a routine prenatal visit:  You will be weighed to make sure you and the baby are growing normally.  Your blood pressure will be taken.  Your abdomen will be measured to track your baby's growth.  The fetal heartbeat will be listened to between weeks 10 and 14 of your pregnancy.  Test results from any previous visits will be discussed.  Your health care provider may ask you:  How you are feeling.  If you are feeling the baby move.  If you have had any abnormal symptoms, such as leaking fluid, bleeding, severe headaches, or abdominal cramping.  If you are using any tobacco products, including cigarettes, chewing tobacco, and electronic cigarettes.  If you have any questions.  Other tests that may be performed during your first trimester include:  Blood tests to find your blood type and to check for the presence of any previous infections. The tests will also be used to check for low iron levels (anemia) and protein on red blood cells (Rh antibodies). Depending on your risk factors, or if you previously had diabetes during pregnancy, you may have tests to check for high blood sugar that affects pregnant women (gestational diabetes).  Urine tests to check for infections, diabetes, or protein in the urine.  An ultrasound to confirm the proper growth and development of the baby.  Fetal screens for spinal cord problems (spina bifida) and Down syndrome.  HIV (human immunodeficiency virus) testing. Routine prenatal testing includes screening for HIV, unless you choose not to have this test.  You may need  other tests to make sure you and the baby are doing well.  Follow these instructions at home: Medicines  Follow your health care provider's instructions regarding medicine use. Specific medicines may be either safe or unsafe to take during pregnancy.  Take a prenatal vitamin that contains at least 600 micrograms (mcg) of folic acid.  If you develop constipation, try taking a stool softener if your health care provider approves. Eating and drinking  Eat a balanced diet that includes fresh fruits and vegetables, whole grains, good sources of protein such as meat, eggs, or tofu, and low-fat dairy. Your health care provider will help you determine the amount of weight gain that is right for you.  Avoid raw meat and uncooked cheese. These carry germs that can cause birth defects in the baby.  Eating four or five small meals rather than three large meals a day may help relieve nausea and vomiting. If you start to feel nauseous, eating  a few soda crackers can be helpful. Drinking liquids between meals, instead of during meals, also seems to help ease nausea and vomiting.  Limit foods that are high in fat and processed sugars, such as fried and sweet foods.  To prevent constipation: ? Eat foods that are high in fiber, such as fresh fruits and vegetables, whole grains, and beans. ? Drink enough fluid to keep your urine clear or pale yellow. Activity  Exercise only as directed by your health care provider. Most women can continue their usual exercise routine during pregnancy. Try to exercise for 30 minutes at least 5 days a week. Exercising will help you: ? Control your weight. ? Stay in shape. ? Be prepared for labor and delivery.  Experiencing pain or cramping in the lower abdomen or lower back is a good sign that you should stop exercising. Check with your health care provider before continuing with normal exercises.  Try to avoid standing for long periods of time. Move your legs often if  you must stand in one place for a long time.  Avoid heavy lifting.  Wear low-heeled shoes and practice good posture.  You may continue to have sex unless your health care provider tells you not to. Relieving pain and discomfort  Wear a good support bra to relieve breast tenderness.  Take warm sitz baths to soothe any pain or discomfort caused by hemorrhoids. Use hemorrhoid cream if your health care provider approves.  Rest with your legs elevated if you have leg cramps or low back pain.  If you develop varicose veins in your legs, wear support hose. Elevate your feet for 15 minutes, 3-4 times a day. Limit salt in your diet. Prenatal care  Schedule your prenatal visits by the twelfth week of pregnancy. They are usually scheduled monthly at first, then more often in the last 2 months before delivery.  Write down your questions. Take them to your prenatal visits.  Keep all your prenatal visits as told by your health care provider. This is important. Safety  Wear your seat belt at all times when driving.  Make a list of emergency phone numbers, including numbers for family, friends, the hospital, and police and fire departments. General instructions  Ask your health care provider for a referral to a local prenatal education class. Begin classes no later than the beginning of month 6 of your pregnancy.  Ask for help if you have counseling or nutritional needs during pregnancy. Your health care provider can offer advice or refer you to specialists for help with various needs.  Do not use hot tubs, steam rooms, or saunas.  Do not douche or use tampons or scented sanitary pads.  Do not cross your legs for long periods of time.  Avoid cat litter boxes and soil used by cats. These carry germs that can cause birth defects in the baby and possibly loss of the fetus by miscarriage or stillbirth.  Avoid all smoking, herbs, alcohol, and medicines not prescribed by your health care  provider. Chemicals in these products affect the formation and growth of the baby.  Do not use any products that contain nicotine or tobacco, such as cigarettes and e-cigarettes. If you need help quitting, ask your health care provider. You may receive counseling support and other resources to help you quit.  Schedule a dentist appointment. At home, brush your teeth with a soft toothbrush and be gentle when you floss. Contact a health care provider if:  You have dizziness.  You  have mild pelvic cramps, pelvic pressure, or nagging pain in the abdominal area.  You have persistent nausea, vomiting, or diarrhea.  You have a bad smelling vaginal discharge.  You have pain when you urinate.  You notice increased swelling in your face, hands, legs, or ankles.  You are exposed to fifth disease or chickenpox.  You are exposed to [place] measles (rubella) and have never had it. Get help right away if:  You have a fever.  You are leaking fluid from your vagina.  You have spotting or bleeding from your vagina.  You have severe abdominal cramping or pain.  You have rapid weight gain or loss.  You vomit blood or material that looks like coffee grounds.  You develop a severe headache.  You have shortness of breath.  You have any kind of trauma, such as from a fall or a car accident. Summary  The first trimester of pregnancy is from week 1 until the end of week 13 (months 1 through 3).  Your body goes through many changes during pregnancy. The changes vary from woman to woman.  You will have routine prenatal visits. During those visits, your health care provider will examine you, discuss any test results you may have, and talk with you about how you are feeling. This information is not intended to replace advice given to you by your health care provider. Make sure you discuss any questions you have with your health care provider. Document Released: 08/17/2001 Document Revised:  08/04/2016 Document Reviewed: 08/04/2016 Elsevier Interactive Patient Education  2017 [employer].

## 2017-08-10 NOTE — Progress Notes (Signed)
Subjective:   Laurie [name] is a 26 y.o. G1P0000 at 627w4d by LMP, early ultrasound done here today being seen today for her first obstetrical visit.  Patient reports mild nausea, no other complaints.  HISTORY: Obstetric History   G1   P0   T0   P0   A0   L0    SAB0   TAB0   Ectopic0   Multiple0   Live Births0     # Outcome Date GA Lbr Len/2nd Weight Sex Delivery Anes PTL Lv  1 Current              Past Medical History:  Diagnosis Date  . Arthritis   . Asthma   . Gall bladder disease   . GERD (gastroesophageal reflux disease)   . Migraine   . MVP (mitral valve prolapse)   . Ovarian cyst    Past Surgical History:  Procedure Laterality Date  . APPENDECTOMY  2010  . CLOSED REDUCTION ELBOW DISLOCATION     Family History  Problem Relation Age of Onset  . Hypertension Maternal Aunt   . Hypertension Paternal Aunt   . Hypertension Maternal Grandmother   . Drug abuse Maternal Grandmother   . Hypertension Maternal Grandfather   . Drug abuse Maternal Grandfather   . Hypertension Paternal Grandmother   . Drug abuse Paternal Grandmother   . Hypertension Paternal Grandfather   . Drug abuse Paternal Grandfather   . Anesthesia problems Neg Hx    Social History   Tobacco Use  . Smoking status: Current Every Day Smoker    Packs/day: 0.50    Types: Cigarettes  Substance Use Topics  . Alcohol use: No    Alcohol/week: 0.0 oz  . Drug use: No   No Known Allergies Current Outpatient Medications on File Prior to Visit  Medication Sig Dispense Refill  . albuterol (PROVENTIL HFA;VENTOLIN HFA) 108 (90 BASE) MCG/ACT inhaler Inhale 2 puffs into the lungs every 6 (six) hours as needed for wheezing or shortness of breath.      No current facility-administered medications on file prior to visit.      Exam   Vitals:   08/10/17 1021  BP: 113/65  Pulse: 76  Weight: 161 lb (73 kg)    Bedside scan: Viable IUP at 3829w6d (consistent with LMP)  Uterus:     Pelvic Exam: Perineum:  no hemorrhoids, normal perineum   Vulva: normal external genitalia, no lesions   Vagina:  normal mucosa, normal discharge   Cervix: no lesions and normal, pap smear done.    Adnexa: normal adnexa and no mass, fullness, tenderness   Bony Pelvis: average  System: General: well-developed, well-nourished female in no acute distress   Breast:  normal appearance, no masses or tenderness   Skin: normal coloration and turgor, no rashes   Neurologic: oriented, normal, negative, normal mood   Extremities: normal strength, tone, and muscle mass, ROM of all joints is normal   HEENT PERRLA, extraocular movement intact and sclera clear, anicteric   Mouth/Teeth mucous membranes moist, pharynx normal without lesions and dental hygiene good   Neck supple and no masses   Cardiovascular: regular rate and rhythm   Respiratory:  no respiratory distress, normal breath sounds   Abdomen: soft, non-tender; bowel sounds normal; no masses,  no organomegaly     Assessment:   Pregnancy: G1P0000 Patient Active Problem List   Diagnosis Date Noted  . Supervision of other normal pregnancy, antepartum 08/09/2017  . Tenosynovitis  07/03/2015     Plan:  1. Supervision of other normal pregnancy, antepartum Initial labs drawn. - Obstetric Panel, Including HIV - Culture, OB Urine - Cytology - PAP - US bedside; Future - Enroll Patient in Babyscripts - Genetic Screening (Panorama and Horizon) Continue prenatal vitamins. Genetic Screening discussed, First trimester screen, Quad screen and NIPS: Panorama ordered. Ultrasound discussed; fetal anatomic survey: to be ordered lately. Problem list reviewed and updated.  Meets criteria for Babyscripts scheduled optimization. The nature of [facility] with multiple MDs and other Advanced Practice Providers was explained to patient; also emphasized that residents, students are part of our team. Routine obstetric precautions  reviewed. Return in about 3 weeks (around 08/31/2017) for OB 12 week visit (Babyscripts).     Jaynie CollinsUGONNA  Jheremy Boger, MD, FACOG Attending Obstetrician & Gynecologist, [facility] for [facility], [facility]

## 2017-08-10 NOTE — Progress Notes (Signed)
DATING AND VIABILITY SONOGRAM   Laurie [name] is a 26 y.o. year old G1P0000 with LMP Patient's last menstrual period was 06/04/2017. which would correlate to  123w4d weeks gestation.  She has regular, irregular menstrual cycles.   She is here today for a confirmatory initial sonogram.    GESTATION: SINGLETON     FETAL ACTIVITY:          Heart rate: 160bpm          The fetus is active.  GESTATIONAL AGE AND  BIOMETRICS:  Gestational criteria: Estimated Date of Delivery: 03/11/18 by LMP now at 2923w4d   CROWN RUMP LENGTH           3.05cm         9.6 weeks                                                                               AVERAGE EGA(BY THIS SCAN):  9.6 weeks  WORKING EDD( LMP ):  03/11/18     TECHNICIAN COMMENTS:  SLIUP measuring 9.6wks by CRL and FHR 160bpm   A copy of this report including all images has been saved and backed up to a second source for retrieval if needed. All measures and details of the anatomical scan, placentation, fluid volume and pelvic anatomy are contained in that report.  Tamarah Bhullar 08/10/2017 10:42 AM

## 2017-08-11 ENCOUNTER — Encounter: Payer: Self-pay | Admitting: Obstetrics & Gynecology

## 2017-08-11 DIAGNOSIS — Z2839 Other underimmunization status: Secondary | ICD-10-CM | POA: Insufficient documentation

## 2017-08-11 DIAGNOSIS — Z283 Underimmunization status: Secondary | ICD-10-CM | POA: Insufficient documentation

## 2017-08-11 DIAGNOSIS — O9989 Other specified diseases and conditions complicating pregnancy, childbirth and the puerperium: Secondary | ICD-10-CM

## 2017-08-11 LAB — OBSTETRIC PANEL, INCLUDING HIV
ANTIBODY SCREEN: NEGATIVE
BASOS: 0 %
Basophils Absolute: 0 10*3/uL (ref 0.0–0.2)
EOS (ABSOLUTE): 0.6 10*3/uL — ABNORMAL HIGH (ref 0.0–0.4)
EOS: 5 %
HEMATOCRIT: 35.2 % (ref 34.0–46.6)
HEMOGLOBIN: 11.9 g/dL (ref 11.1–15.9)
HIV Screen 4th Generation wRfx: NONREACTIVE
Hepatitis B Surface Ag: NEGATIVE
IMMATURE GRANS (ABS): 0 10*3/uL (ref 0.0–0.1)
Immature Granulocytes: 0 %
LYMPHS: 16 %
Lymphocytes Absolute: 1.8 10*3/uL (ref 0.7–3.1)
MCH: 30.9 pg (ref 26.6–33.0)
MCHC: 33.8 g/dL (ref 31.5–35.7)
MCV: 91 fL (ref 79–97)
MONOCYTES: 7 %
Monocytes Absolute: 0.8 10*3/uL (ref 0.1–0.9)
Neutrophils Absolute: 8.2 10*3/uL — ABNORMAL HIGH (ref 1.4–7.0)
Neutrophils: 72 %
Platelets: 317 10*3/uL (ref 150–379)
RBC: 3.85 x10E6/uL (ref 3.77–5.28)
RDW: 14 % (ref 12.3–15.4)
RH TYPE: POSITIVE
RPR: NONREACTIVE
WBC: 11.4 10*3/uL — ABNORMAL HIGH (ref 3.4–10.8)

## 2017-08-11 LAB — CYTOLOGY - PAP
CHLAMYDIA, DNA PROBE: NEGATIVE
Diagnosis: NEGATIVE
NEISSERIA GONORRHEA: NEGATIVE
Trichomonas: POSITIVE — AB

## 2017-08-12 ENCOUNTER — Other Ambulatory Visit: Payer: Self-pay | Admitting: Obstetrics & Gynecology

## 2017-08-12 ENCOUNTER — Telehealth: Payer: Self-pay

## 2017-08-12 DIAGNOSIS — A599 Trichomoniasis, unspecified: Secondary | ICD-10-CM

## 2017-08-12 LAB — CULTURE, OB URINE

## 2017-08-12 LAB — URINE CULTURE, OB REFLEX

## 2017-08-12 MED ORDER — METRONIDAZOLE 500 MG PO TABS: 500.0000 mg | ORAL_TABLET | Freq: Two times a day (BID) | ORAL | 1 refills | Status: DC

## 2017-08-12 NOTE — Telephone Encounter (Signed)
Call patient to inform her of test results- no answer or voice mail to leave a message. 

## 2017-08-12 NOTE — Telephone Encounter (Signed)
-----   Message from Ugonna A Anyanwu, MD sent at 08/12/2017  2:24 PM EST ----- Vaginal discharge test is abnormal and showed trichomonas infection. Please inform patient of results and advise to pick up prescription. Other STI tests are negative.  Recommend that she needs to let partner(s) know so the partner(s) can get testing and treatment. Patient and sex partner(s) should abstain from unprotected sexual activity for seven days after everyone receives appropriate treatment.  Metronidazole was prescribed for patient.  Please call to inform patient of results and recommendations, and advise to pick up prescription.  

## 2017-08-16 ENCOUNTER — Telehealth: Payer: Self-pay

## 2017-08-16 NOTE — Telephone Encounter (Signed)
Second attempt to reach patient regarding lab results and need to be treated for STI(trich). I have left a voice mail for patient to call us back regarding test result.

## 2017-08-16 NOTE — Telephone Encounter (Signed)
-----   Message from Tereso NewcomerUgonna A Anyanwu, MD sent at 08/12/2017  2:24 PM EST ----- Vaginal discharge test is abnormal and showed trichomonas infection. Please inform patient of results and advise to pick up prescription. Other STI tests are negative.  Recommend that she needs to let partner(s) know so the partner(s) can get testing and treatment. Patient and sex partner(s) should abstain from unprotected sexual activity for seven days after everyone receives appropriate treatment.  Metronidazole was prescribed for patient.  Please call to inform patient of results and recommendations, and advise to pick up prescription.

## 2017-08-17 ENCOUNTER — Encounter: Payer: Self-pay | Admitting: *Deleted

## 2017-08-25 ENCOUNTER — Encounter: Payer: Self-pay | Admitting: *Deleted

## 2017-08-31 ENCOUNTER — Encounter: Payer: Self-pay | Admitting: Obstetrics and Gynecology

## 2017-09-06 NOTE — L&D Delivery Note (Addendum)
Obstetrical Delivery Note   Date of Delivery:   02/20/2018 Primary OB:   [facility] Gestational Age/EDD: 4048w2d Reason for Admission: IOL atypical HELLP Antepartum complications: Tobacco abuse, trichomonas with negative test of cure, insufficient prenatal care  Delivered By:   Cornelia Copaharlie Linley Moxley, Jr MD  Delivery Type:   vacuum, low  Delivery Details:   I was called by Dr. Holly Bodilyegeleto see the patient for assistance with delivery with possible need for operative vaginal delivery assistance due to inadequate pushing. On arrival, FHR normal. Patient having contractions approximately q1-6370m with early decelerations during contractions and pushing but with recovery to normal FHR in between contractions. Because the fetus was so low in the pelvis (+3 to +4), a normal FHR was heard intermittently in between contractions but wasn't tracing well. Before I arrived, Dr. Nira Retortegele tried to place an FSE but had difficulty with staying on due to fetal hair. Given that the FHR could be heard on EFM in between contractions and it was normal, I felt that FSE monitoring wasn't necessary.   Over the next few contractions, she pushed the fetus to fully +4 but was unable to complete the delivery due to what seemed to be maternal exhaustion and a tight introitus. I offered vacuum assistance and she was amenable to this. Kiwi vacuum applied to DOA fetus at the flexion point and suction to green zone. With next 2-3 contractions, with relaxation of the vacuum in between, she delivered the baby; a right medio-lateral episotomy was done with the final contraction due to a tight vaginal opening. The baby was floppy and cord clamped and cut immediately, and the baby was handed to the awaiting NICU team.   Laceration repaired in the usual fashion with 2-0 vicryl by Dr. Nira Retortegele and Vergie LivingPickens.  Anesthesia:    epidural Intrapartum complications: Chorioamionitis on ampicillin & gentamycin. 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 Intact: yes. To pathology: yes.  Delayed Cord Clamping: no Estimated Blood Loss:  300mL  Baby:    Stillborn female (NICU states fetus never had a heartbeat during resuscitation), APGARs 0/0, weight 3305gm Results for Epimenio FootLUCAS, BOY Caron (MRN [id number]) as of 02/20/2018 05:16  Ref. Range 02/20/2018 04:09 02/20/2018 04:09  pH cord blood (arterial) Latest Ref Range: 7.210 - 7.380  6.837 (LL)   pCO2 cord blood (arterial) Latest Ref Range: 42.0 - 56.0 mmHg 115.0 (H)   Bicarbonate Latest Ref Range: 13.0 - 22.0 mmol/L 18.5 17.6  Ph Cord Blood (Venous) Latest Ref Range: 7.240 - 7.380   7.298  pCO2 Cord Blood (Venous) Latest Ref Range: 42.0 - 56.0   37.1 (L)    Cornelia Copaharlie Maleko Greulich, Jr. MD Attending Center for [facility](Faculty Practice)

## 2017-09-14 ENCOUNTER — Ambulatory Visit (INDEPENDENT_AMBULATORY_CARE_PROVIDER_SITE_OTHER): Payer: Self-pay | Admitting: Family Medicine

## 2017-09-14 VITALS — BP 110/78 | HR 72 | Wt 162.0 lb

## 2017-09-14 DIAGNOSIS — A5901 Trichomonal vulvovaginitis: Secondary | ICD-10-CM

## 2017-09-14 DIAGNOSIS — O99330 Smoking (tobacco) complicating pregnancy, unspecified trimester: Secondary | ICD-10-CM | POA: Insufficient documentation

## 2017-09-14 DIAGNOSIS — A599 Trichomoniasis, unspecified: Secondary | ICD-10-CM

## 2017-09-14 DIAGNOSIS — O23599 Infection of other part of genital tract in pregnancy, unspecified trimester: Secondary | ICD-10-CM

## 2017-09-14 DIAGNOSIS — Z3480 Encounter for supervision of other normal pregnancy, unspecified trimester: Secondary | ICD-10-CM

## 2017-09-14 MED ORDER — METRONIDAZOLE 500 MG PO TABS: 500.0000 mg | ORAL_TABLET | Freq: Two times a day (BID) | ORAL | 0 refills | Status: AC

## 2017-09-14 NOTE — Progress Notes (Signed)
Patient never did get treated for trich.

## 2017-09-14 NOTE — Progress Notes (Signed)
   PRENATAL VISIT NOTE  Subjective:  Laurie [name] is a 27 y.o. G1P0000 at 5760w4d being seen today for ongoing prenatal care.  She is currently monitored for the following issues for this low-risk pregnancy and has Tenosynovitis; Supervision of other normal pregnancy, antepartum; Rubella non-immune status, antepartum; Trichomonal vaginitis during pregnancy, antepartum; and Tobacco use affecting pregnancy, antepartum on their problem list.  Patient reports no complaints.  Contractions: Not present.  .  Movement: Present. Denies leaking of fluid.   The following portions of the patient's history were reviewed and updated as appropriate: allergies, current medications, past family history, past medical history, past social history, past surgical history and problem list. Problem list updated.  Objective:   Vitals:   09/14/17 1109  BP: 110/78  Pulse: 72  Weight: 162 lb (73.5 kg)    Fetal Status: Fetal Heart Rate (bpm): 141 Fundal Height: 14 cm Movement: Present     General:  Alert, oriented and cooperative. Patient is in no acute distress.  Skin: Skin is warm and dry. No rash noted.   Cardiovascular: Normal heart rate noted  Respiratory: Normal respiratory effort, no problems with respiration noted  Abdomen: Soft, gravid, appropriate for gestational age.  Pain/Pressure: Absent     Pelvic: Cervical exam deferred        Extremities: Normal range of motion.  Edema: None  Mental Status:  Normal mood and affect. Normal behavior. Normal judgment and thought content.   Assessment and Plan:  Pregnancy: G1P0000 at 4760w4d  1. Trichomonal vaginitis during pregnancy, antepartum - metroNIDAZOLE (FLAGYL) 500 MG tablet; Take 1 tablet (500 mg total) by mouth 2 (two) times daily for 7 days.  Dispense: 14 tablet; Refill: 0  3. Supervision of other normal pregnancy, antepartum - needs anatomy scan - US MFM OB COMP + 14 WK; Future  4. Need for influenza vaccination Desires and will receive-- left  before recieving  5. Tobacco use affecting pregnancy, antepartum Quit at 6 weeks!!  Preterm labor symptoms and general obstetric precautions including but not limited to vaginal bleeding, contractions, leaking of fluid and fetal movement were reviewed in detail with the patient. Please refer to After Visit Summary for other counseling recommendations.   Return in about 4 weeks (around 10/12/2017) for Routine prenatal care.   Federico FlakeKimberly Niles Newton, MD

## 2017-10-06 ENCOUNTER — Encounter (HOSPITAL_COMMUNITY): Payer: Self-pay | Admitting: Family Medicine

## 2017-10-12 ENCOUNTER — Encounter: Payer: Self-pay | Admitting: Obstetrics & Gynecology

## 2017-10-17 ENCOUNTER — Ambulatory Visit (HOSPITAL_COMMUNITY): Payer: Self-pay | Attending: Family Medicine

## 2017-11-03 ENCOUNTER — Encounter: Payer: Self-pay | Admitting: Obstetrics and Gynecology

## 2017-11-08 ENCOUNTER — Other Ambulatory Visit (HOSPITAL_COMMUNITY)
Admission: RE | Admit: 2017-11-08 | Discharge: 2017-11-08 | Disposition: A | Discharge status: Home / Self Care | Payer: Medicaid Other | Source: Ambulatory Visit | Attending: Family Medicine | Admitting: Family Medicine

## 2017-11-08 ENCOUNTER — Ambulatory Visit (INDEPENDENT_AMBULATORY_CARE_PROVIDER_SITE_OTHER): Payer: Medicaid Other | Admitting: Family Medicine

## 2017-11-08 VITALS — BP 109/69 | HR 72 | Wt 175.2 lb

## 2017-11-08 DIAGNOSIS — A5901 Trichomonal vulvovaginitis: Secondary | ICD-10-CM | POA: Diagnosis present

## 2017-11-08 DIAGNOSIS — O23599 Infection of other part of genital tract in pregnancy, unspecified trimester: Secondary | ICD-10-CM | POA: Diagnosis present

## 2017-11-08 DIAGNOSIS — O23592 Infection of other part of genital tract in pregnancy, second trimester: Secondary | ICD-10-CM

## 2017-11-08 DIAGNOSIS — Z3A00 Weeks of gestation of pregnancy not specified: Secondary | ICD-10-CM | POA: Insufficient documentation

## 2017-11-08 DIAGNOSIS — Z09 Encounter for follow-up examination after completed treatment for conditions other than malignant neoplasm: Secondary | ICD-10-CM | POA: Insufficient documentation

## 2017-11-08 DIAGNOSIS — Z283 Underimmunization status: Secondary | ICD-10-CM

## 2017-11-08 DIAGNOSIS — O9989 Other specified diseases and conditions complicating pregnancy, childbirth and the puerperium: Secondary | ICD-10-CM

## 2017-11-08 DIAGNOSIS — Z8619 Personal history of other infectious and parasitic diseases: Secondary | ICD-10-CM | POA: Insufficient documentation

## 2017-11-08 DIAGNOSIS — O09899 Supervision of other high risk pregnancies, unspecified trimester: Secondary | ICD-10-CM

## 2017-11-08 DIAGNOSIS — Z3480 Encounter for supervision of other normal pregnancy, unspecified trimester: Secondary | ICD-10-CM

## 2017-11-08 DIAGNOSIS — Z23 Encounter for immunization: Secondary | ICD-10-CM

## 2017-11-08 DIAGNOSIS — Z3482 Encounter for supervision of other normal pregnancy, second trimester: Secondary | ICD-10-CM

## 2017-11-08 NOTE — Patient Instructions (Signed)

## 2017-11-09 ENCOUNTER — Inpatient Hospital Stay (HOSPITAL_COMMUNITY)
Admission: AD | Admit: 2017-11-09 | Discharge: 2017-11-09 | Disposition: A | Discharge status: Home / Self Care | Payer: Medicaid Other | Source: Ambulatory Visit | Attending: Family Medicine | Admitting: Family Medicine

## 2017-11-09 ENCOUNTER — Encounter (HOSPITAL_COMMUNITY): Payer: Self-pay | Admitting: *Deleted

## 2017-11-09 DIAGNOSIS — O99332 Smoking (tobacco) complicating pregnancy, second trimester: Secondary | ICD-10-CM | POA: Diagnosis not present

## 2017-11-09 DIAGNOSIS — O26852 Spotting complicating pregnancy, second trimester: Secondary | ICD-10-CM | POA: Diagnosis present

## 2017-11-09 DIAGNOSIS — Z3A22 22 weeks gestation of pregnancy: Secondary | ICD-10-CM | POA: Insufficient documentation

## 2017-11-09 DIAGNOSIS — Z9889 Other specified postprocedural states: Secondary | ICD-10-CM | POA: Insufficient documentation

## 2017-11-09 DIAGNOSIS — O99512 Diseases of the respiratory system complicating pregnancy, second trimester: Secondary | ICD-10-CM | POA: Diagnosis not present

## 2017-11-09 DIAGNOSIS — Z79899 Other long term (current) drug therapy: Secondary | ICD-10-CM | POA: Diagnosis not present

## 2017-11-09 DIAGNOSIS — R58 Hemorrhage, not elsewhere classified: Secondary | ICD-10-CM | POA: Diagnosis not present

## 2017-11-09 DIAGNOSIS — F17210 Nicotine dependence, cigarettes, uncomplicated: Secondary | ICD-10-CM | POA: Diagnosis not present

## 2017-11-09 DIAGNOSIS — Z813 Family history of other psychoactive substance abuse and dependence: Secondary | ICD-10-CM | POA: Diagnosis not present

## 2017-11-09 DIAGNOSIS — Z8249 Family history of ischemic heart disease and other diseases of the circulatory system: Secondary | ICD-10-CM | POA: Insufficient documentation

## 2017-11-09 LAB — URINALYSIS, ROUTINE W REFLEX MICROSCOPIC
BILIRUBIN URINE: NEGATIVE
HGB URINE DIPSTICK: NEGATIVE
Ketones, ur: NEGATIVE mg/dL
NITRITE: NEGATIVE
Protein, ur: NEGATIVE mg/dL
SPECIFIC GRAVITY, URINE: 1.009 (ref 1.005–1.030)
pH: 7 (ref 5.0–8.0)

## 2017-11-09 LAB — WET PREP, GENITAL
Clue Cells Wet Prep HPF POC: NONE SEEN
SPERM: NONE SEEN
Trich, Wet Prep: NONE SEEN
YEAST WET PREP: NONE SEEN

## 2017-11-09 LAB — CERVICOVAGINAL ANCILLARY ONLY
Bacterial vaginitis: NEGATIVE
Candida vaginitis: NEGATIVE
Trichomonas: NEGATIVE

## 2017-11-09 LAB — OB RESULTS CONSOLE GC/CHLAMYDIA: Gonorrhea: NEGATIVE

## 2017-11-09 LAB — GC/CHLAMYDIA PROBE AMP (~~LOC~~) NOT AT ARMC
Chlamydia: NEGATIVE
Neisseria Gonorrhea: NEGATIVE

## 2017-11-09 NOTE — Discharge Instructions (Signed)
Vaginal Bleeding During Pregnancy, Second Trimester A small amount of bleeding (spotting) from the vagina is relatively common in pregnancy. It usually stops on its own. Various things can cause bleeding or spotting in pregnancy. Some bleeding may be related to the pregnancy, and some may not. Sometimes the bleeding is normal and is not a problem. However, bleeding can also be a sign of something serious. Be sure to tell your health care provider about any vaginal bleeding right away. Some possible causes of vaginal bleeding during the second trimester include:  Infection, inflammation, or growths on the cervix.  The placenta may be partially or completely covering the opening of the cervix inside the uterus (placenta previa).  The placenta may have separated from the uterus (abruption of the placenta).  You may be having early (preterm) labor.  The cervix may not be strong enough to keep a baby inside the uterus (cervical insufficiency).  Tiny cysts may have developed in the uterus instead of pregnancy tissue (molar pregnancy).  Follow these instructions at home: Watch your condition for any changes. The following actions may help to lessen any discomfort you are feeling:  Follow your health care provider's instructions for limiting your activity. If your health care provider orders bed rest, you may need to stay in bed and only get up to use the bathroom. However, your health care provider may allow you to continue light activity.  If needed, make plans for someone to help with your regular activities and responsibilities while you are on bed rest.  Keep track of the number of pads you use each day, how often you change pads, and how soaked (saturated) they are. Write this down.  Do not use tampons. Do not douche.  Do not have sexual intercourse or orgasms until approved by your health care provider.  If you pass any tissue from your vagina, save the tissue so you can show it to your  health care provider.  Only take over-the-counter or prescription medicines as directed by your health care provider.  Do not take aspirin because it can make you bleed.  Do not exercise or perform any strenuous activities or heavy lifting without your health care provider's permission.  Keep all follow-up appointments as directed by your health care provider.  Contact a health care provider if:  You have any vaginal bleeding during any part of your pregnancy.  You have cramps or labor pains.  You have a fever, not controlled by medicine. Get help right away if:  You have severe cramps in your back or belly (abdomen).  You have contractions.  You have chills.  You pass large clots or tissue from your vagina.  Your bleeding increases.  You feel light-headed or weak, or you have fainting episodes.  You are leaking fluid or have a gush of fluid from your vagina. This information is not intended to replace advice given to you by your health care provider. Make sure you discuss any questions you have with your health care provider. Document Released: 06/02/2005 Document Revised: 01/29/2016 Document Reviewed: 04/30/2013 Elsevier Interactive Patient Education  2018 [employer] Hemorrhoids Hemorrhoids are swollen veins in and around the rectum or anus. There are two types of hemorrhoids:  Internal hemorrhoids. These occur in the veins that are just inside the rectum. They may poke through to the outside and become irritated and painful.  External hemorrhoids. These occur in the veins that are outside of the anus and can be felt as a painful swelling  or hard lump near the anus.  Most hemorrhoids do not cause serious problems, and they can be managed with home treatments such as diet and lifestyle changes. If home treatments do not help your symptoms, procedures can be done to shrink or remove the hemorrhoids. What are the causes? This condition is caused by increased pressure in  the anal area. This pressure may result from various things, including:  Constipation.  Straining to have a bowel movement.  Diarrhea.  Pregnancy.  Obesity.  Sitting for long periods of time.  Heavy lifting or other activity that causes you to strain.  Anal sex.  What are the signs or symptoms? Symptoms of this condition include:  Pain.  Anal itching or irritation.  Rectal bleeding.  Leakage of stool (feces).  Anal swelling.  One or more lumps around the anus.  How is this diagnosed? This condition can often be diagnosed through a visual exam. Other exams or tests may also be done, such as:  Examination of the rectal area with a gloved hand (digital rectal exam).  Examination of the anal canal using a small tube (anoscope).  A blood test, if you have lost a significant amount of blood.  A test to look inside the colon (sigmoidoscopy or colonoscopy).  How is this treated? This condition can usually be treated at home. However, various procedures may be done if dietary changes, lifestyle changes, and other home treatments do not help your symptoms. These procedures can help make the hemorrhoids smaller or remove them completely. Some of these procedures involve surgery, and others do not. Common procedures include:  Rubber band ligation. Rubber bands are placed at the base of the hemorrhoids to cut off the blood supply to them.  Sclerotherapy. Medicine is injected into the hemorrhoids to shrink them.  Infrared coagulation. A type of light energy is used to get rid of the hemorrhoids.  Hemorrhoidectomy surgery. The hemorrhoids are surgically removed, and the veins that supply them are tied off.  Stapled hemorrhoidopexy surgery. A circular stapling device is used to remove the hemorrhoids and use staples to cut off the blood supply to them.  Follow these instructions at home: Eating and drinking  Eat foods that have a lot of fiber in them, such as whole grains,  beans, nuts, fruits, and vegetables. Ask your health care provider about taking products that have added fiber (fiber supplements).  Drink enough fluid to keep your urine clear or pale yellow. Managing pain and swelling  Take warm sitz baths for 20 minutes, 3-4 times a day to ease pain and discomfort.  If directed, apply ice to the affected area. Using ice packs between sitz baths may be helpful. ? Put ice in a plastic bag. ? Place a towel between your skin and the bag. ? Leave the ice on for 20 minutes, 2-3 times a day. General instructions  Take over-the-counter and prescription medicines only as told by your health care provider.  Use medicated creams or suppositories as told.  Exercise regularly.  Go to the bathroom when you have the urge to have a bowel movement. Do not wait.  Avoid straining to have bowel movements.  Keep the anal area dry and clean. Use wet toilet paper or moist towelettes after a bowel movement.  Do not sit on the toilet for long periods of time. This increases blood pooling and pain. Contact a health care provider if:  You have increasing pain and swelling that are not controlled by treatment or medicine.  You have uncontrolled bleeding.  You have difficulty having a bowel movement, or you are unable to have a bowel movement.  You have pain or inflammation outside the area of the hemorrhoids. This information is not intended to replace advice given to you by your health care provider. Make sure you discuss any questions you have with your health care provider. Document Released: 08/20/2000 Document Revised: 01/21/2016 Document Reviewed: 05/07/2015 Elsevier Interactive Patient Education  [employer].

## 2017-11-09 NOTE — Progress Notes (Signed)
    PRENATAL VISIT NOTE  Subjective:  Laurie [name] is a 27 y.o. G1P0000 at 41w4dbeing seen today for ongoing prenatal care.  She is currently monitored for the following issues for this low-risk pregnancy and has Tenosynovitis; Supervision of other normal pregnancy, antepartum; Rubella non-immune status, antepartum; Trichomonal vaginitis during pregnancy, antepartum; and Tobacco use affecting pregnancy, antepartum on their problem list.  Patient reports no complaints.  Contractions: Not present.  .  Movement: Present. Denies leaking of fluid.   The following portions of the patient's history were reviewed and updated as appropriate: allergies, current medications, past family history, past medical history, past social history, past surgical history and problem list. Problem list updated.  Objective:   Vitals:   11/08/17 1517  BP: 109/69  Pulse: 72  Weight: 175 lb 3.2 oz (79.5 kg)    Fetal Status: Fetal Heart Rate (bpm): 142 Fundal Height: 23 cm Movement: Present     General:  Alert, oriented and cooperative. Patient is in no acute distress.  Skin: Skin is warm and dry. No rash noted.   Cardiovascular: Normal heart rate noted  Respiratory: Normal respiratory effort, no problems with respiration noted  Abdomen: Soft, gravid, appropriate for gestational age.  Pain/Pressure: Absent     Pelvic: Cervical exam deferred        Extremities: Normal range of motion.  Edema: None  Mental Status:  Normal mood and affect. Normal behavior. Normal judgment and thought content.   Assessment and Plan:  Pregnancy: G1P0000 at 251w4d1. Supervision of other normal pregnancy, antepartum Needs anatomy u/s scheduled - Flu Vaccine QUAD 36+ mos IM (Fluarix, Quad PF)  2. Trichomonal vaginitis during pregnancy, antepartum TOC today - Cervicovaginal ancillary only  3. Rubella non-immune status, antepartum Will need MMR pp  Preterm labor symptoms and general obstetric precautions including but not  limited to vaginal bleeding, contractions, leaking of fluid and fetal movement were reviewed in detail with the patient. Please refer to After Visit Summary for other counseling recommendations.  Return in 4 weeks (on 12/06/2017) for 28 wk labs.   TaDonnamae JudeMD

## 2017-11-09 NOTE — MAU Provider Note (Signed)
Chief Complaint:  Vaginal Bleeding (spotting )   First Provider Initiated Contact with Patient 11/09/17 0116     HPI: Laurie [name] is a 27 y.o. G1P0000 at 1622w4dwho presents to maternity admissions reporting bleeding this evening, pink in color after initial red color. .Does admit to having a BM prior to bleeding.  She reports good fetal movement, denies LOF, vaginal bleeding, vaginal itching/burning, urinary symptoms, h/a, dizziness, n/v, diarrhea, constipation or fever/chills.  She denies headache, visual changes or RUQ abdominal pain.  Vaginal Bleeding  The patient's primary symptoms include pelvic pain and vaginal bleeding. The patient's pertinent negatives include no genital itching, genital lesions or genital odor. This is a new problem. The current episode started today. The problem occurs rarely. The problem has been resolved. The pain is mild. She is pregnant. Pertinent negatives include no chills, fever, flank pain, frequency, nausea or vomiting. The vaginal discharge was bloody. The vaginal bleeding is spotting. She has not been passing clots. She has not been passing tissue. Nothing aggravates the symptoms. She has tried nothing for the symptoms.    RN Note: Pt presents to MAU c/o vaginal bleeding. Pt states at 1700 on 3/5 she started to spot she noticed some red bleeding on the tissue which has since then turned to light pink spotting. Pt states at 2230 on 3/5 she started to have some lower abdominal cramping.pt reports +FM. Pt denies vaginal dc or LOF.    Past Medical History: Past Medical History:  Diagnosis Date  . Arthritis   . Asthma   . Gall bladder disease   . GERD (gastroesophageal reflux disease)   . Migraine   . MVP (mitral valve prolapse)   . Ovarian cyst     Past obstetric history: OB History  Gravida Para Term Preterm AB Living  1 0 0 0 0 0  SAB TAB Ectopic Multiple Live Births  0 0 0 0 0    # Outcome Date GA Lbr Len/2nd Weight Sex Delivery Anes PTL Lv   1 Current               Past Surgical History: Past Surgical History:  Procedure Laterality Date  . APPENDECTOMY  2010  . CLOSED REDUCTION ELBOW DISLOCATION      Family History: Family History  Problem Relation Age of Onset  . Hypertension Maternal Aunt   . Hypertension Paternal Aunt   . Hypertension Maternal Grandmother   . Drug abuse Maternal Grandmother   . Hypertension Maternal Grandfather   . Drug abuse Maternal Grandfather   . Hypertension Paternal Grandmother   . Drug abuse Paternal Grandmother   . Hypertension Paternal Grandfather   . Drug abuse Paternal Grandfather   . Anesthesia problems Neg Hx     Social History: Social History   Tobacco Use  . Smoking status: Current Every Day Smoker    Packs/day: 0.25    Types: Cigarettes  Substance Use Topics  . Alcohol use: No    Alcohol/week: 0.0 oz  . Drug use: No    Allergies: No Known Allergies  Meds:  Medications Prior to Admission  Medication Sig Dispense Refill Last Dose  . Prenatal Vit-Fe Fumarate-FA (PRENATAL MULTIVITAMIN) TABS tablet Take 1 tablet by mouth daily at 12 noon.   11/08/2017 at Unknown time  . albuterol (PROVENTIL HFA;VENTOLIN HFA) 108 (90 BASE) MCG/ACT inhaler Inhale 2 puffs into the lungs every 6 (six) hours as needed for wheezing or shortness of breath.    More than a month  at Unknown time    I have reviewed patient's Past Medical Hx, Surgical Hx, Family Hx, Social Hx, medications and allergies.   ROS:  Review of Systems  Constitutional: Negative for chills and fever.  Gastrointestinal: Negative for nausea and vomiting.  Genitourinary: Positive for pelvic pain and vaginal bleeding. Negative for flank pain and frequency.   Other systems negative  Physical Exam   Patient Vitals for the past 24 hrs:  BP Temp Temp src Pulse Resp Height Weight  11/09/17 0055 122/64 98.2 F (36.8 C) Oral 83 18 5' 7.5" (1.715 m) 178 lb 12.8 oz (81.1 kg)   Constitutional: Well-developed, well-nourished  female in no acute distress.  Cardiovascular: normal rate and rhythm Respiratory: normal effort, clear to auscultation bilaterally GI: Abd soft, non-tender, gravid appropriate for gestational age.   No rebound or guarding.   MS: Extremities nontender, no edema, normal ROM Neurologic: Alert and oriented x 4.  GU: Neg CVAT.  PELVIC EXAM: Cervix pink, visually closed, without lesion, scant white creamy discharge, vaginal walls and external genitalia normal   No color or blood observed in vault at all.  Cervix visualized and is long and closed   FHT:    145  Labs: O/Positive/-- (12/05 1130) Results for orders placed or performed during the hospital encounter of 11/09/17 (from the past 24 hour(s))  Urinalysis, Routine w reflex microscopic     Status: Abnormal   Collection Time: 11/09/17 12:30 AM  Result Value Ref Range   Color, Urine YELLOW YELLOW   APPearance HAZY (A) CLEAR   Specific Gravity, Urine 1.009 1.005 - 1.030   pH 7.0 5.0 - 8.0   Glucose, UA >=500 (A) NEGATIVE mg/dL   Hgb urine dipstick NEGATIVE NEGATIVE   Bilirubin Urine NEGATIVE NEGATIVE   Ketones, ur NEGATIVE NEGATIVE mg/dL   Protein, ur NEGATIVE NEGATIVE mg/dL   Nitrite NEGATIVE NEGATIVE   Leukocytes, UA TRACE (A) NEGATIVE   RBC / HPF 0-5 0 - 5 RBC/hpf   WBC, UA 0-5 0 - 5 WBC/hpf   Bacteria, UA FEW (A) NONE SEEN   Squamous Epithelial / LPF 6-30 (A) NONE SEEN   Mucus PRESENT   Wet prep, genital     Status: Abnormal   Collection Time: 11/09/17  1:10 AM  Result Value Ref Range   Yeast Wet Prep HPF POC NONE SEEN NONE SEEN   Trich, Wet Prep NONE SEEN NONE SEEN   Clue Cells Wet Prep HPF POC NONE SEEN NONE SEEN   WBC, Wet Prep HPF POC FEW (A) NONE SEEN   Sperm NONE SEEN     Imaging:  No results found.  MAU Course/MDM: I have ordered labs and reviewed results. UA and wet prep are neg (Wet prep done due to recent trich)   Treatments in MAU included none  Discussed lack of any colored discharge.  Probably  indicates bleeding was rectal, given it occurred after bowel movement.  .    Assessment: Single IUP at [redacted]w[redacted]d Bleeding, thought to be vaginal, likely rectal or unknown source  Plan: Discharge home Preterm Labor precautions and fetal kick counts If recurs, would treat with Proctofoam HC Follow up in Office for prenatal visits and recheck  Encouraged to return here or to other Urgent Care/ED if she develops worsening of symptoms, increase in pain, fever, or other concerning symptoms.   Pt stable at time of discharge.  Wynelle Bourgeois CNM, MSN Certified Nurse-Midwife 11/09/2017 1:16 AM

## 2017-11-09 NOTE — MAU Note (Signed)
Pt presents to MAU c/o vaginal bleeding. Pt states at 1700 on 3/5 she started to spot she noticed some red bleeding on the tissue which has since then turned to light pink spotting. Pt states at 2230 on 3/5 she started to have some lower abdominal cramping.pt reports +FM. Pt denies vaginal dc or LOF.

## 2017-11-11 ENCOUNTER — Ambulatory Visit (HOSPITAL_COMMUNITY): Payer: Self-pay

## 2017-11-14 ENCOUNTER — Other Ambulatory Visit: Payer: Self-pay | Admitting: Family Medicine

## 2017-11-14 ENCOUNTER — Ambulatory Visit (HOSPITAL_COMMUNITY)
Admission: RE | Admit: 2017-11-14 | Discharge: 2017-11-14 | Disposition: A | Discharge status: Home / Self Care | Payer: Medicaid Other | Source: Ambulatory Visit | Attending: Family Medicine | Admitting: Family Medicine

## 2017-11-14 DIAGNOSIS — Z3A23 23 weeks gestation of pregnancy: Secondary | ICD-10-CM

## 2017-11-14 DIAGNOSIS — Z363 Encounter for antenatal screening for malformations: Secondary | ICD-10-CM

## 2017-11-14 DIAGNOSIS — Z3480 Encounter for supervision of other normal pregnancy, unspecified trimester: Secondary | ICD-10-CM

## 2017-12-06 ENCOUNTER — Encounter: Payer: Self-pay | Admitting: Obstetrics and Gynecology

## 2017-12-06 NOTE — Progress Notes (Signed)
Patient did not keep OB appointment for 12/06/2017.  Margart Zemanek, Jr MD Attending [facility])   

## 2017-12-07 ENCOUNTER — Telehealth: Payer: Self-pay | Admitting: Radiology

## 2017-12-07 NOTE — Telephone Encounter (Signed)
Left message on the cell phone voicemail to [facility] to reschedule 28 Wk ROB/ GTT appointment .

## 2017-12-09 ENCOUNTER — Ambulatory Visit (INDEPENDENT_AMBULATORY_CARE_PROVIDER_SITE_OTHER): Payer: Self-pay | Admitting: Obstetrics & Gynecology

## 2017-12-09 VITALS — BP 104/63 | HR 79 | Wt 176.0 lb

## 2017-12-09 DIAGNOSIS — Z3480 Encounter for supervision of other normal pregnancy, unspecified trimester: Secondary | ICD-10-CM

## 2017-12-09 NOTE — Progress Notes (Signed)
   PRENATAL VISIT NOTE  Subjective:  Laurie [name] is a 27 y.o. G1P0000 at 274w6d being seen today for ongoing prenatal care.  She is currently monitored for the following issues for this low-risk pregnancy and has Tenosynovitis; Supervision of other normal pregnancy, antepartum; Rubella non-immune status, antepartum; Trichomonal vaginitis during pregnancy, antepartum; and Tobacco use affecting pregnancy, antepartum on their problem list.  Patient reports no complaints.  Contractions: Not present. Vag. Bleeding: None.  Movement: Present. Denies leaking of fluid.   The following portions of the patient's history were reviewed and updated as appropriate: allergies, current medications, past family history, past medical history, past social history, past surgical history and problem list. Problem list updated.  Objective:   Vitals:   12/09/17 0931  BP: 104/63  Pulse: 79  Weight: 176 lb (79.8 kg)    Fetal Status: Fetal Heart Rate (bpm): 138   Movement: Present     General:  Alert, oriented and cooperative. Patient is in no acute distress.  Skin: Skin is warm and dry. No rash noted.   Cardiovascular: Normal heart rate noted  Respiratory: Normal respiratory effort, no problems with respiration noted  Abdomen: Soft, gravid, appropriate for gestational age.  Pain/Pressure: Absent     Pelvic: Cervical exam deferred        Extremities: Normal range of motion.  Edema: None  Mental Status: Normal mood and affect. Normal behavior. Normal judgment and thought content.   Assessment and Plan:  Pregnancy: G1P0000 at [redacted]w[redacted]d  1. Supervision of other normal pregnancy, antepartum - She declines TDAP - Glucose Tolerance, 2 Hours w/1 Hour - CBC - HIV antibody - RPR  Preterm labor symptoms and general obstetric precautions including but not limited to vaginal bleeding, contractions, leaking of fluid and fetal movement were reviewed in detail with the patient. Please refer to After Visit Summary for  other counseling recommendations.  Return in about 3 weeks (around 12/30/2017).  Future Appointments  Date Time Provider Department Center  12/09/2017 11:00 AM Allie Bossierove, Rosi Secrist C, MD [facility]    Allie BossierMyra C Gaylyn Berish, MD

## 2017-12-10 LAB — CBC
Hematocrit: 31.1 % — ABNORMAL LOW (ref 34.0–46.6)
Hemoglobin: 10.2 g/dL — ABNORMAL LOW (ref 11.1–15.9)
MCH: 31.7 pg (ref 26.6–33.0)
MCHC: 32.8 g/dL (ref 31.5–35.7)
MCV: 97 fL (ref 79–97)
Platelets: 332 10*3/uL (ref 150–379)
RBC: 3.22 x10E6/uL — AB (ref 3.77–5.28)
RDW: 13.5 % (ref 12.3–15.4)
WBC: 13.5 10*3/uL — AB (ref 3.4–10.8)

## 2017-12-10 LAB — GLUCOSE TOLERANCE, 2 HOURS W/ 1HR
GLUCOSE, 1 HOUR: 148 mg/dL (ref 65–179)
GLUCOSE, 2 HOUR: 90 mg/dL (ref 65–152)
Glucose, Fasting: 68 mg/dL (ref 65–91)

## 2017-12-10 LAB — RPR: RPR: NONREACTIVE

## 2017-12-10 LAB — HIV ANTIBODY (ROUTINE TESTING W REFLEX): HIV SCREEN 4TH GENERATION: NONREACTIVE

## 2017-12-30 ENCOUNTER — Ambulatory Visit (INDEPENDENT_AMBULATORY_CARE_PROVIDER_SITE_OTHER): Payer: Medicaid Other | Admitting: Obstetrics and Gynecology

## 2017-12-30 VITALS — BP 118/72 | HR 72 | Wt 181.0 lb

## 2017-12-30 DIAGNOSIS — Z3480 Encounter for supervision of other normal pregnancy, unspecified trimester: Secondary | ICD-10-CM

## 2017-12-30 DIAGNOSIS — O23599 Infection of other part of genital tract in pregnancy, unspecified trimester: Secondary | ICD-10-CM

## 2017-12-30 DIAGNOSIS — O23593 Infection of other part of genital tract in pregnancy, third trimester: Secondary | ICD-10-CM

## 2017-12-30 DIAGNOSIS — A5901 Trichomonal vulvovaginitis: Secondary | ICD-10-CM

## 2017-12-30 MED ORDER — FERROUS GLUCONATE 324 (38 FE) MG PO TABS: 324.0000 mg | ORAL_TABLET | Freq: Every day | ORAL | 1 refills | Status: DC

## 2017-12-30 NOTE — Progress Notes (Signed)
Prenatal Visit Note Date: 12/30/2017 Clinic: [facility]  Subjective:  Laurie [name] is a 27 y.o. G1P0000 at 5410w6d being seen today for ongoing prenatal care.  She is currently monitored for the following issues for this low-risk pregnancy and has Tenosynovitis; Supervision of other normal pregnancy, antepartum; Rubella non-immune status, antepartum; Trichomonal vaginitis during pregnancy, antepartum; and Tobacco use affecting pregnancy, antepartum on their problem list.  Patient reports no complaints.   Contractions: Not present. Vag. Bleeding: None.  Movement: Present. Denies leaking of fluid.   The following portions of the patient's history were reviewed and updated as appropriate: allergies, current medications, past family history, past medical history, past social history, past surgical history and problem list. Problem list updated.  Objective:   Vitals:   12/30/17 1114  BP: 118/72  Pulse: 72  Weight: 181 lb (82.1 kg)    Fetal Status: Fetal Heart Rate (bpm): 128 Fundal Height: 29 cm Movement: Present     General:  Alert, oriented and cooperative. Patient is in no acute distress.  Skin: Skin is warm and dry. No rash noted.   Cardiovascular: Normal heart rate noted  Respiratory: Normal respiratory effort, no problems with respiration noted  Abdomen: Soft, gravid, appropriate for gestational age. Pain/Pressure: Absent     Pelvic:  Cervical exam deferred        Extremities: Normal range of motion.  Edema: None  Mental Status: Normal mood and affect. Normal behavior. Normal judgment and thought content.   Urinalysis:      Assessment and Plan:  Pregnancy: G1P0000 at 3210w6d  1. Supervision of other normal pregnancy, antepartum Routine care. F/u anatomy u/s ordered. Recommend po qday iron - US MFM OB FOLLOW UP; Future   Preterm labor symptoms and general obstetric precautions including but not limited to vaginal bleeding, contractions, leaking of fluid  and fetal movement were reviewed in detail with the patient. Please refer to After Visit Summary for other counseling recommendations.  Return in about 2 weeks (around 01/13/2018).   [place] BingPickens, Azim Gillingham, MD

## 2018-01-11 ENCOUNTER — Ambulatory Visit (HOSPITAL_COMMUNITY): Payer: Medicaid Other | Attending: Obstetrics and Gynecology

## 2018-01-13 ENCOUNTER — Encounter: Payer: Self-pay | Admitting: Obstetrics and Gynecology

## 2018-01-13 NOTE — Progress Notes (Signed)
Patient did not keep return OB appointment for 01/13/2018.  Cornelia Copa MD Attending [facility])

## 2018-01-15 NOTE — Progress Notes (Signed)
Patient did not keep OB appointment for 01/13/2018.  Cornelia Copa MD [facility]ending [facility])

## 2018-01-18 ENCOUNTER — Encounter: Payer: Self-pay | Admitting: Family Medicine

## 2018-01-18 ENCOUNTER — Ambulatory Visit (INDEPENDENT_AMBULATORY_CARE_PROVIDER_SITE_OTHER): Payer: Medicaid Other | Admitting: Family Medicine

## 2018-01-18 VITALS — BP 114/72 | HR 72 | Wt 178.6 lb

## 2018-01-18 DIAGNOSIS — O99330 Smoking (tobacco) complicating pregnancy, unspecified trimester: Secondary | ICD-10-CM

## 2018-01-18 DIAGNOSIS — O99333 Smoking (tobacco) complicating pregnancy, third trimester: Secondary | ICD-10-CM

## 2018-01-18 DIAGNOSIS — Z3480 Encounter for supervision of other normal pregnancy, unspecified trimester: Secondary | ICD-10-CM

## 2018-01-18 DIAGNOSIS — Z2839 Other underimmunization status: Secondary | ICD-10-CM

## 2018-01-18 DIAGNOSIS — O23593 Infection of other part of genital tract in pregnancy, third trimester: Secondary | ICD-10-CM

## 2018-01-18 DIAGNOSIS — A5901 Trichomonal vulvovaginitis: Secondary | ICD-10-CM

## 2018-01-18 DIAGNOSIS — Z3483 Encounter for supervision of other normal pregnancy, third trimester: Secondary | ICD-10-CM

## 2018-01-18 DIAGNOSIS — Z283 Underimmunization status: Secondary | ICD-10-CM

## 2018-01-18 DIAGNOSIS — O9989 Other specified diseases and conditions complicating pregnancy, childbirth and the puerperium: Secondary | ICD-10-CM

## 2018-01-18 DIAGNOSIS — O23599 Infection of other part of genital tract in pregnancy, unspecified trimester: Secondary | ICD-10-CM

## 2018-01-18 NOTE — Progress Notes (Signed)
   PRENATAL VISIT NOTE  Subjective:  Laurie [name] is a 27 y.o. G1P0000 at 22w4dbeing seen today for ongoing prenatal care.  She is currently monitored for the following issues for this low-risk pregnancy and has Tenosynovitis; Supervision of other normal pregnancy, antepartum; Rubella non-immune status, antepartum; Trichomonal vaginitis during pregnancy, antepartum; and Tobacco use affecting pregnancy, antepartum on their problem list.  Patient reports no complaints.  Contractions: Not present. Vag. Bleeding: None.  Movement: Present. Denies leaking of fluid.   The following portions of the patient's history were reviewed and updated as appropriate: allergies, current medications, past family history, past medical history, past social history, past surgical history and problem list. Problem list updated.  Objective:   Vitals:   01/18/18 1411  BP: 114/72  Pulse: 72  Weight: 178 lb 9.6 oz (81 kg)    Fetal Status: Fetal Heart Rate (bpm): 132 Fundal Height: 33 cm Movement: Present     General:  Alert, oriented and cooperative. Patient is in no acute distress.  Skin: Skin is warm and dry. No rash noted.   Cardiovascular: Normal heart rate noted  Respiratory: Normal respiratory effort, no problems with respiration noted  Abdomen: Soft, gravid, appropriate for gestational age.  Pain/Pressure: Absent     Pelvic: Cervical exam deferred        Extremities: Normal range of motion.  Edema: None  Mental Status: Normal mood and affect. Normal behavior. Normal judgment and thought content.   Assessment and Plan:  Pregnancy: G1P0000 at 357w4d1. Trichomonal vaginitis during pregnancy, antepartum TOC neg  2. Supervision of other normal pregnancy, antepartum UTD currently,. Growth appropriate  3. Rubella non-immune status, antepartum Needs MMR after delivery  4. Tobacco use affecting pregnancy, antepartum Quit! Discussed tobacco cessation and nicotine replacement after del if needed.  Reviewed health benefits of remaining smoke free  Preterm labor symptoms and general obstetric precautions including but not limited to vaginal bleeding, contractions, leaking of fluid and fetal movement were reviewed in detail with the patient. Please refer to After Visit Summary for other counseling recommendations.  No follow-ups on file.  Future Appointments  Date Time Provider [place]5/29/2019 11:00 AM NeCaren MacadamMD [facility]    KiCaren MacadamMD

## 2018-02-01 ENCOUNTER — Encounter: Payer: Medicaid Other | Admitting: Family Medicine

## 2018-02-06 ENCOUNTER — Ambulatory Visit (INDEPENDENT_AMBULATORY_CARE_PROVIDER_SITE_OTHER): Payer: Medicaid Other | Admitting: Obstetrics & Gynecology

## 2018-02-06 VITALS — BP 121/75 | HR 75 | Wt 179.0 lb

## 2018-02-06 DIAGNOSIS — Z3480 Encounter for supervision of other normal pregnancy, unspecified trimester: Secondary | ICD-10-CM

## 2018-02-06 NOTE — Progress Notes (Signed)
   PRENATAL VISIT NOTE  Subjective:  Laurie [name] is a 27 y.o. G1P0000 at 6640w2d being seen today for ongoing prenatal care.  She is currently monitored for the following issues for this low-risk pregnancy and has Tenosynovitis; Supervision of other normal pregnancy, antepartum; Rubella non-immune status, antepartum; Trichomonal vaginitis during pregnancy, antepartum; and Tobacco use affecting pregnancy, antepartum on their problem list.  Patient reports no complaints.  Contractions: Not present.  .  Movement: Present. Denies leaking of fluid.   The following portions of the patient's history were reviewed and updated as appropriate: allergies, current medications, past family history, past medical history, past social history, past surgical history and problem list. Problem list updated.  Objective:   Vitals:   02/06/18 1359  BP: 121/75  Pulse: 75  Weight: 179 lb (81.2 kg)    Fetal Status: Fetal Heart Rate (bpm): 135   Movement: Present     General:  Alert, oriented and cooperative. Patient is in no acute distress.  Skin: Skin is warm and dry. No rash noted.   Cardiovascular: Normal heart rate noted  Respiratory: Normal respiratory effort, no problems with respiration noted  Abdomen: Soft, gravid, appropriate for gestational age.  Pain/Pressure: Present     Pelvic: Cervical exam deferred        Extremities: Normal range of motion.  Edema: None  Mental Status: Normal mood and affect. Normal behavior. Normal judgment and thought content.   Assessment and Plan:  Pregnancy: G1P0000 at 2740w2d  1. Supervision of other normal pregnancy, antepartum - cervical cultures at next visit  Preterm labor symptoms and general obstetric precautions including but not limited to vaginal bleeding, contractions, leaking of fluid and fetal movement were reviewed in detail with the patient. Please refer to After Visit Summary for other counseling recommendations.  Return in about 1 week (around  02/13/2018) for cervical cultures at next visit.  No future appointments.  Allie BossierMyra C Venola Castello, MD

## 2018-02-13 ENCOUNTER — Encounter: Payer: Self-pay | Admitting: Family Medicine

## 2018-02-13 NOTE — Progress Notes (Signed)
Patient did not keep appointment today. She will be called to reschedule.  

## 2018-02-18 ENCOUNTER — Encounter (HOSPITAL_COMMUNITY): Payer: Self-pay | Admitting: *Deleted

## 2018-02-18 ENCOUNTER — Inpatient Hospital Stay (HOSPITAL_COMMUNITY): Payer: Medicaid Other | Admitting: Anesthesiology

## 2018-02-18 ENCOUNTER — Inpatient Hospital Stay (HOSPITAL_COMMUNITY)
Admission: AD | Admit: 2018-02-18 | Discharge: 2018-02-22 | DRG: 805 | Disposition: A | Discharge status: Home / Self Care | Payer: Medicaid Other | Attending: Obstetrics and Gynecology | Admitting: Obstetrics and Gynecology

## 2018-02-18 DIAGNOSIS — Z87891 Personal history of nicotine dependence: Secondary | ICD-10-CM | POA: Diagnosis not present

## 2018-02-18 DIAGNOSIS — O99824 Streptococcus B carrier state complicating childbirth: Secondary | ICD-10-CM | POA: Diagnosis not present

## 2018-02-18 DIAGNOSIS — N179 Acute kidney failure, unspecified: Secondary | ICD-10-CM | POA: Diagnosis not present

## 2018-02-18 DIAGNOSIS — O9902 Anemia complicating childbirth: Secondary | ICD-10-CM | POA: Diagnosis present

## 2018-02-18 DIAGNOSIS — O1414 Severe pre-eclampsia complicating childbirth: Secondary | ICD-10-CM | POA: Diagnosis not present

## 2018-02-18 DIAGNOSIS — J45909 Unspecified asthma, uncomplicated: Secondary | ICD-10-CM | POA: Diagnosis present

## 2018-02-18 DIAGNOSIS — Z283 Underimmunization status: Secondary | ICD-10-CM

## 2018-02-18 DIAGNOSIS — O23599 Infection of other part of genital tract in pregnancy, unspecified trimester: Secondary | ICD-10-CM

## 2018-02-18 DIAGNOSIS — O1420 HELLP syndrome (HELLP), unspecified trimester: Secondary | ICD-10-CM | POA: Diagnosis present

## 2018-02-18 DIAGNOSIS — O321XX0 Maternal care for breech presentation, not applicable or unspecified: Secondary | ICD-10-CM | POA: Diagnosis present

## 2018-02-18 DIAGNOSIS — O9989 Other specified diseases and conditions complicating pregnancy, childbirth and the puerperium: Secondary | ICD-10-CM | POA: Diagnosis present

## 2018-02-18 DIAGNOSIS — A5901 Trichomonal vulvovaginitis: Secondary | ICD-10-CM

## 2018-02-18 DIAGNOSIS — O1424 HELLP syndrome, complicating childbirth: Secondary | ICD-10-CM | POA: Diagnosis present

## 2018-02-18 DIAGNOSIS — D649 Anemia, unspecified: Secondary | ICD-10-CM | POA: Diagnosis present

## 2018-02-18 DIAGNOSIS — O9952 Diseases of the respiratory system complicating childbirth: Secondary | ICD-10-CM | POA: Diagnosis present

## 2018-02-18 DIAGNOSIS — Z3A37 37 weeks gestation of pregnancy: Secondary | ICD-10-CM | POA: Diagnosis not present

## 2018-02-18 DIAGNOSIS — O1423 HELLP syndrome (HELLP), third trimester: Secondary | ICD-10-CM

## 2018-02-18 DIAGNOSIS — O411230 Chorioamnionitis, third trimester, not applicable or unspecified: Secondary | ICD-10-CM | POA: Diagnosis present

## 2018-02-18 DIAGNOSIS — O9962 Diseases of the digestive system complicating childbirth: Secondary | ICD-10-CM | POA: Diagnosis present

## 2018-02-18 DIAGNOSIS — O09899 Supervision of other high risk pregnancies, unspecified trimester: Secondary | ICD-10-CM

## 2018-02-18 DIAGNOSIS — Z3480 Encounter for supervision of other normal pregnancy, unspecified trimester: Secondary | ICD-10-CM

## 2018-02-18 LAB — COMPREHENSIVE METABOLIC PANEL
ALK PHOS: 234 U/L — AB (ref 38–126)
ALK PHOS: 213 U/L — AB (ref 38–126)
ALT: 89 U/L — AB (ref 14–54)
ALT: 83 U/L — AB (ref 14–54)
ALT: 77 U/L — ABNORMAL HIGH (ref 14–54)
ANION GAP: 8 (ref 5–15)
AST: 67 U/L — AB (ref 15–41)
AST: 61 U/L — AB (ref 15–41)
AST: 55 U/L — AB (ref 15–41)
Albumin: 2.7 g/dL — ABNORMAL LOW (ref 3.5–5.0)
Albumin: 2.6 g/dL — ABNORMAL LOW (ref 3.5–5.0)
Albumin: 2.5 g/dL — ABNORMAL LOW (ref 3.5–5.0)
Alkaline Phosphatase: 219 U/L — ABNORMAL HIGH (ref 38–126)
Anion gap: 10 (ref 5–15)
Anion gap: 9 (ref 5–15)
BILIRUBIN TOTAL: 0.1 mg/dL — AB (ref 0.3–1.2)
BILIRUBIN TOTAL: 0.1 mg/dL — AB (ref 0.3–1.2)
BUN: 17 mg/dL (ref 6–20)
BUN: 13 mg/dL (ref 6–20)
BUN: 11 mg/dL (ref 6–20)
CALCIUM: 8.7 mg/dL — AB (ref 8.9–10.3)
CHLORIDE: 109 mmol/L (ref 101–111)
CHLORIDE: 103 mmol/L (ref 101–111)
CO2: 18 mmol/L — ABNORMAL LOW (ref 22–32)
CO2: 22 mmol/L (ref 22–32)
CO2: 21 mmol/L — ABNORMAL LOW (ref 22–32)
CREATININE: 0.91 mg/dL (ref 0.44–1.00)
CREATININE: 0.85 mg/dL (ref 0.44–1.00)
Calcium: 8.1 mg/dL — ABNORMAL LOW (ref 8.9–10.3)
Calcium: 7.7 mg/dL — ABNORMAL LOW (ref 8.9–10.3)
Chloride: 106 mmol/L (ref 101–111)
Creatinine, Ser: 0.86 mg/dL (ref 0.44–1.00)
GFR calc Af Amer: >60 mL/min (ref >60)
GFR calc Af Amer: >60 mL/min (ref >60)
GFR calc non Af Amer: >60 mL/min (ref >60)
GLUCOSE: 91 mg/dL (ref 65–99)
Glucose, Bld: 75 mg/dL (ref 65–99)
Glucose, Bld: 141 mg/dL — ABNORMAL HIGH (ref 65–99)
POTASSIUM: 4.2 mmol/L (ref 3.5–5.1)
Potassium: 4 mmol/L (ref 3.5–5.1)
Potassium: 3.6 mmol/L (ref 3.5–5.1)
SODIUM: 136 mmol/L (ref 135–145)
Sodium: 137 mmol/L (ref 135–145)
Sodium: 133 mmol/L — ABNORMAL LOW (ref 135–145)
TOTAL PROTEIN: 6.5 g/dL (ref 6.5–8.1)
TOTAL PROTEIN: 5.9 g/dL — AB (ref 6.5–8.1)
Total Bilirubin: 0.5 mg/dL (ref 0.3–1.2)
Total Protein: 6.3 g/dL — ABNORMAL LOW (ref 6.5–8.1)

## 2018-02-18 LAB — URINALYSIS, ROUTINE W REFLEX MICROSCOPIC
BILIRUBIN URINE: NEGATIVE
Glucose, UA: NEGATIVE mg/dL
Ketones, ur: NEGATIVE mg/dL
LEUKOCYTES UA: NEGATIVE
NITRITE: NEGATIVE
PROTEIN: NEGATIVE mg/dL
Specific Gravity, Urine: 1.006 (ref 1.005–1.030)
pH: 5 (ref 5.0–8.0)

## 2018-02-18 LAB — CBC
HCT: 31.4 % — ABNORMAL LOW (ref 36.0–46.0)
HCT: 29.8 % — ABNORMAL LOW (ref 36.0–46.0)
HEMATOCRIT: 32.5 % — AB (ref 36.0–46.0)
HEMATOCRIT: 32.3 % — AB (ref 36.0–46.0)
HEMOGLOBIN: 11 g/dL — AB (ref 12.0–15.0)
HEMOGLOBIN: 10.8 g/dL — AB (ref 12.0–15.0)
Hemoglobin: 10.6 g/dL — ABNORMAL LOW (ref 12.0–15.0)
Hemoglobin: 10 g/dL — ABNORMAL LOW (ref 12.0–15.0)
MCH: 31.7 pg (ref 26.0–34.0)
MCH: 31.6 pg (ref 26.0–34.0)
MCH: 31.5 pg (ref 26.0–34.0)
MCH: 31.6 pg (ref 26.0–34.0)
MCHC: 33.8 g/dL (ref 30.0–36.0)
MCHC: 33.8 g/dL (ref 30.0–36.0)
MCHC: 33.6 g/dL (ref 30.0–36.0)
MCHC: 33.4 g/dL (ref 30.0–36.0)
MCV: 93.7 fL (ref 78.0–100.0)
MCV: 93.7 fL (ref 78.0–100.0)
MCV: 94 fL (ref 78.0–100.0)
MCV: 94.4 fL (ref 78.0–100.0)
PLATELETS: 319 10*3/uL (ref 150–400)
Platelets: 307 10*3/uL (ref 150–400)
Platelets: 296 10*3/uL (ref 150–400)
Platelets: 331 10*3/uL (ref 150–400)
RBC: 3.47 MIL/uL — AB (ref 3.87–5.11)
RBC: 3.35 MIL/uL — AB (ref 3.87–5.11)
RBC: 3.17 MIL/uL — ABNORMAL LOW (ref 3.87–5.11)
RBC: 3.42 MIL/uL — AB (ref 3.87–5.11)
RDW: 13.7 % (ref 11.5–15.5)
RDW: 13.8 % (ref 11.5–15.5)
RDW: 13.8 % (ref 11.5–15.5)
RDW: 14 % (ref 11.5–15.5)
WBC: 12.1 10*3/uL — ABNORMAL HIGH (ref 4.0–10.5)
WBC: 11 10*3/uL — AB (ref 4.0–10.5)
WBC: 11.9 10*3/uL — ABNORMAL HIGH (ref 4.0–10.5)
WBC: 12.8 10*3/uL — ABNORMAL HIGH (ref 4.0–10.5)

## 2018-02-18 LAB — OB RESULTS CONSOLE GBS: GBS: NEGATIVE

## 2018-02-18 LAB — RPR: RPR Ser Ql: NONREACTIVE

## 2018-02-18 LAB — ABO/RH: ABO/RH(D): O POS

## 2018-02-18 LAB — PROTEIN / CREATININE RATIO, URINE
CREATININE, URINE: 45 mg/dL
Total Protein, Urine: <6 mg/dL

## 2018-02-18 LAB — GROUP B STREP BY PCR: GROUP B STREP BY PCR: NEGATIVE

## 2018-02-18 LAB — TYPE AND SCREEN
ABO/RH(D): O POS
Antibody Screen: NEGATIVE

## 2018-02-18 MED ORDER — NICOTINE 14 MG/24HR TD PT24
14.0000 mg | MEDICATED_PATCH | Freq: Every day | TRANSDERMAL | Status: DC
  Administered 2018-02-18 – 2018-02-19 (×2): 14 mg via TRANSDERMAL
  Filled 2018-02-18 (×3): qty 1

## 2018-02-18 MED ORDER — EPHEDRINE 5 MG/ML INJ
10.0000 mg | INTRAVENOUS | Status: DC | PRN
Start: 2018-02-18 — End: 2018-02-20
  Filled 2018-02-18: qty 2

## 2018-02-18 MED ORDER — LACTATED RINGERS IV SOLN
INTRAVENOUS | Status: DC
  Administered 2018-02-18 (×2): via INTRAVENOUS
  Administered 2018-02-18: 100 mL/h via INTRAVENOUS
  Administered 2018-02-18: 125 mL/h via INTRAVENOUS
  Administered 2018-02-19: 03:00:00 via INTRAVENOUS
  Administered 2018-02-19: 100 mL/h via INTRAVENOUS

## 2018-02-18 MED ORDER — ACETAMINOPHEN 325 MG PO TABS: 650.0000 mg | ORAL_TABLET | ORAL | Status: DC | PRN

## 2018-02-18 MED ORDER — LIDOCAINE HCL (PF) 1 % IJ SOLN
INTRAMUSCULAR | Status: DC | PRN
  Administered 2018-02-18: 4 mL via EPIDURAL
  Administered 2018-02-18: 6 mL via EPIDURAL

## 2018-02-18 MED ORDER — OXYTOCIN 40 UNITS IN LACTATED RINGERS INFUSION - SIMPLE MED
1.0000 m[IU]/min | INTRAVENOUS | Status: DC
  Administered 2018-02-18: 2 m[IU]/min via INTRAVENOUS
  Filled 2018-02-18: qty 1000

## 2018-02-18 MED ORDER — TERBUTALINE SULFATE 1 MG/ML IJ SOLN: 0.2500 mg | Freq: Once | INTRAMUSCULAR | Status: DC | PRN

## 2018-02-18 MED ORDER — OXYTOCIN BOLUS FROM INFUSION
500.0000 mL | Freq: Once | INTRAVENOUS | Status: AC
  Administered 2018-02-20: 500 mL via INTRAVENOUS

## 2018-02-18 MED ORDER — PHENYLEPHRINE 40 MCG/ML (10ML) SYRINGE FOR IV PUSH (FOR BLOOD PRESSURE SUPPORT)
80.0000 ug | PREFILLED_SYRINGE | INTRAVENOUS | Status: DC | PRN
  Filled 2018-02-18: qty 5
  Filled 2018-02-18: qty 10

## 2018-02-18 MED ORDER — LACTATED RINGERS IV SOLN
500.0000 mL | INTRAVENOUS | Status: DC | PRN
  Administered 2018-02-18: 500 mL via INTRAVENOUS

## 2018-02-18 MED ORDER — FENTANYL CITRATE (PF) 100 MCG/2ML IJ SOLN
50.0000 ug | INTRAMUSCULAR | Status: DC | PRN
  Administered 2018-02-18: 100 ug via INTRAVENOUS
  Administered 2018-02-18: 50 ug via INTRAVENOUS
  Administered 2018-02-18 (×2): 100 ug via INTRAVENOUS
  Filled 2018-02-18 (×4): qty 2

## 2018-02-18 MED ORDER — NICOTINE 14 MG/24HR TD PT24
14.0000 mg | MEDICATED_PATCH | Freq: Every day | TRANSDERMAL | Status: DC
  Filled 2018-02-18: qty 1

## 2018-02-18 MED ORDER — LIDOCAINE HCL (PF) 1 % IJ SOLN
30.0000 mL | INTRAMUSCULAR | Status: DC | PRN
Start: 2018-02-18 — End: 2018-02-20
  Filled 2018-02-18: qty 30

## 2018-02-18 MED ORDER — MAGNESIUM SULFATE 40 G IN LACTATED RINGERS - SIMPLE
2.0000 g/h | INTRAVENOUS | Status: DC
  Administered 2018-02-18 – 2018-02-19 (×2): 2 g/h via INTRAVENOUS
  Filled 2018-02-18: qty 500
  Filled 2018-02-18 (×2): qty 40

## 2018-02-18 MED ORDER — MAGNESIUM SULFATE BOLUS VIA INFUSION
4.0000 g | Freq: Once | INTRAVENOUS | Status: AC
  Administered 2018-02-18: 4 g via INTRAVENOUS
  Filled 2018-02-18: qty 500

## 2018-02-18 MED ORDER — FENTANYL 2.5 MCG/ML BUPIVACAINE 1/10 % EPIDURAL INFUSION (WH - ANES)
14.0000 mL/h | INTRAMUSCULAR | Status: DC | PRN
  Administered 2018-02-18 – 2018-02-20 (×6): 14 mL/h via EPIDURAL
  Filled 2018-02-18 (×6): qty 100

## 2018-02-18 MED ORDER — SOD CITRATE-CITRIC ACID 500-334 MG/5ML PO SOLN: 30.0000 mL | ORAL | Status: DC | PRN

## 2018-02-18 MED ORDER — HYDRALAZINE HCL 20 MG/ML IJ SOLN: 10.0000 mg | Freq: Once | INTRAMUSCULAR | Status: DC | PRN

## 2018-02-18 MED ORDER — MISOPROSTOL 50MCG HALF TABLET
50.0000 ug | ORAL_TABLET | ORAL | Status: DC | PRN
  Administered 2018-02-18: 50 ug via BUCCAL
  Filled 2018-02-18 (×2): qty 1

## 2018-02-18 MED ORDER — EPHEDRINE 5 MG/ML INJ
10.0000 mg | INTRAVENOUS | Status: DC | PRN
  Filled 2018-02-18: qty 2

## 2018-02-18 MED ORDER — LACTATED RINGERS IV SOLN: 500.0000 mL | Freq: Once | INTRAVENOUS | Status: DC

## 2018-02-18 MED ORDER — OXYCODONE-ACETAMINOPHEN 5-325 MG PO TABS
2.0000 | ORAL_TABLET | ORAL | Status: DC | PRN
  Administered 2018-02-20: 2 via ORAL
  Filled 2018-02-18: qty 2

## 2018-02-18 MED ORDER — ONDANSETRON HCL 4 MG/2ML IJ SOLN: 4.0000 mg | Freq: Four times a day (QID) | INTRAMUSCULAR | Status: DC | PRN

## 2018-02-18 MED ORDER — OXYTOCIN 40 UNITS IN LACTATED RINGERS INFUSION - SIMPLE MED
2.5000 [IU]/h | INTRAVENOUS | Status: DC
  Filled 2018-02-18: qty 1000

## 2018-02-18 MED ORDER — LABETALOL HCL 5 MG/ML IV SOLN: 20.0000 mg | INTRAVENOUS | Status: DC | PRN

## 2018-02-18 MED ORDER — DIPHENHYDRAMINE HCL 50 MG/ML IJ SOLN
12.5000 mg | INTRAMUSCULAR | Status: AC | PRN
  Administered 2018-02-18 – 2018-02-19 (×3): 12.5 mg via INTRAVENOUS
  Filled 2018-02-18 (×2): qty 1

## 2018-02-18 MED ORDER — PHENYLEPHRINE 40 MCG/ML (10ML) SYRINGE FOR IV PUSH (FOR BLOOD PRESSURE SUPPORT)
80.0000 ug | PREFILLED_SYRINGE | INTRAVENOUS | Status: DC | PRN
  Administered 2018-02-18 (×2): 80 ug via INTRAVENOUS
  Filled 2018-02-18: qty 5

## 2018-02-18 MED ORDER — OXYCODONE-ACETAMINOPHEN 5-325 MG PO TABS: 1.0000 | ORAL_TABLET | ORAL | Status: DC | PRN

## 2018-02-18 NOTE — MAU Note (Signed)
Lost mucous plug Thurs night. Had some ctxs THurs night but are worse nots. Denies vag bleeding or LOF.

## 2018-02-18 NOTE — Anesthesia Preprocedure Evaluation (Signed)
Anesthesia Evaluation  Patient identified by MRN, date of birth, ID band Patient awake    Reviewed: Allergy & Precautions, NPO status , Patient's Chart, lab work & pertinent test results  Airway Mallampati: II  TM Distance: >3 FB Neck ROM: Full    Dental  (+) Dental Advisory Given   Pulmonary asthma , former smoker,    breath sounds clear to auscultation       Cardiovascular hypertension, + Valvular Problems/Murmurs MVP  Rhythm:Regular Rate:Normal     Neuro/Psych  Headaches, negative psych ROS   GI/Hepatic Neg liver ROS, GERD  ,  Endo/Other  negative endocrine ROS  Renal/GU negative Renal ROS  negative genitourinary   Musculoskeletal  (+) Arthritis ,   Abdominal   Peds  Hematology  (+) anemia ,   Anesthesia Other Findings   Reproductive/Obstetrics (+) Pregnancy Severe pre-eclampsia vs. HELLP syndrome                             Anesthesia Physical Anesthesia Plan  ASA: III  Anesthesia Plan: Epidural   Post-op Pain Management:    Induction:   PONV Risk Score and Plan: Treatment may vary due to age or medical condition  Airway Management Planned: Natural Airway  Additional Equipment: None  Intra-op Plan:   Post-operative Plan:   Informed Consent: I have reviewed the patients History and Physical, chart, labs and discussed the procedure including the risks, benefits and alternatives for the proposed anesthesia with the patient or authorized representative who has indicated his/her understanding and acceptance.     Plan Discussed with: Anesthesiologist  Anesthesia Plan Comments: (Labs reviewed. Platelets acceptable, patient not taking any blood thinning medications. Risks and benefits discussed with patient, patient expressed understanding and wished to proceed.)        Anesthesia Quick Evaluation

## 2018-02-18 NOTE — Progress Notes (Signed)
Labor Progress Note  Laurie [name] is a 27 y.o. G1P0000 at 2478w0d admitted for induction of labor due to Pre-eclamptic toxemia of pregnancy.  S: Comfortable with epidural. No concerns. Denies PIH symptoms (HA, scotomata, RUQ pain).  O:  BP 134/80   Pulse 77   Temp 97.8 F (36.6 C) (Axillary)   Resp 16   Ht 5\' 9"  (1.753 m)   Wt 82.6 kg (182 lb)   LMP 06/04/2017   SpO2 99%   BMI 26.88 kg/m   Total I/O In: 840.6 [P.O.:560; I.V.:252.6; Other:28] Out: 550 [Urine:550]  FHT:  FHR: 125 bpm, variability: moderate,  accelerations:  Present,  decelerations:  Absent UC:   regular, every 2-5 minutes SVE:   Dilation: 5.5 Effacement (%): 70 Station: -3 Exam by:: Doroteo GlassmanPhelps MD AROM: meconium 2237  Pitocin @ 8 mu/min  Labs: Lab Results  Component Value Date   WBC 12.8 (H) 02/18/2018   HGB 10.8 (L) 02/18/2018   HCT 32.3 (L) 02/18/2018   MCV 94.4 02/18/2018   PLT 331 02/18/2018    Assessment / Plan: 27 y.o. G1P0000 2478w0d  In active labor Induction of labor due to preeclampsia,  progressing well on pitocin  Labor: Progressing on Pitocin, will continue to increase. AROM'd. Preeclampsia: Labs have been stable. BPs stable; not in severe range. Asymptomatic. Appropriate intake/output. On Mag.  Fetal Wellbeing:  Category I Pain Control:  Epidural Anticipated MOD:  NSVD  Expectant management   Caryl AdaJazma Aveena Bari, DO OB Fellow Center for [facility], [facility]

## 2018-02-18 NOTE — Anesthesia Procedure Notes (Signed)
Epidural Patient location during procedure: OB Start time: 02/18/2018 3:56 PM End time: 02/18/2018 3:59 PM  Staffing Anesthesiologist: Beryle LatheBrock, Cadie Sorci E, MD Performed: anesthesiologist   Preanesthetic Checklist Completed: patient identified, pre-op evaluation, timeout performed, IV checked, risks and benefits discussed and monitors and equipment checked  Epidural Patient position: sitting Prep: DuraPrep Patient monitoring: continuous pulse ox and blood pressure Approach: midline Location: L2-L3 Injection technique: LOR saline  Needle:  Needle type: Tuohy  Needle gauge: 17 G Needle length: 9 cm Needle insertion depth: 7 cm Catheter size: 19 Gauge Catheter at skin depth: 12 cm Test dose: negative and Other (1% lidocaine)  Additional Notes Patient identified. Risks including, but not limited to, bleeding, infection, nerve damage, paralysis, inadequate analgesia, blood pressure changes, nausea, vomiting, allergic reaction, postpartum back pain, itching, and headache were discussed. Patient expressed understanding and wished to proceed. Sterile prep and drape, including hand hygiene, mask, and sterile gloves were used. The patient was positioned and the spine was prepped. The skin was anesthetized with lidocaine. No paraesthesia or other complication noted. The patient did not experience any signs of intravascular injection such as tinnitus or metallic taste in mouth, nor signs of intrathecal spread such as rapid motor block. Please see nursing notes for vital signs. The patient tolerated the procedure well.   Leslye Peerhomas Ebone Alcivar, MDReason for block:procedure for pain

## 2018-02-18 NOTE — H&P (Signed)
Obstetric History and Physical  Carnelia Oscar Janus is a 27 y.o. G1P0000 with IUP at [redacted]w[redacted]d presenting for IOL for ?HELLP vs severe preeclampsia. Minimally elevated BPs on MAU so PIH labs obtained showing elevated LFTs; all other labs normal. No blurry vision, headaches or peripheral edema, and RUQ pain. Patient states she has been having  none contractions, none vaginal bleeding, intact membranes, with active fetal movement.    Prenatal Course Source of Care: [place]  Dating: By LMP --->  Estimated Date of Delivery: 03/11/18 Pregnancy complications or risks: -Trich diagnosed and treated with [facility] Patient Active Problem List   Diagnosis Date Noted  . HELLP (hemolytic anemia/elev liver enzymes/low platelets in pregnancy) 02/18/2018  . Trichomonal vaginitis during pregnancy, antepartum 09/14/2017  . Tobacco use affecting pregnancy, antepartum 09/14/2017  . Rubella non-immune status, antepartum 08/11/2017  . Supervision of other normal pregnancy, antepartum 08/09/2017  . Tenosynovitis 07/03/2015   She plans to breastfeed She desires Nexplanon for postpartum contraception.   Sono:   @[redacted]w[redacted]d , CWD, normal anatomy, breech presentation, posterior placenta, 657g, 65% EFW, normal AFI  Prenatal labs and studies: ABO, Rh: O/Positive/-- (12/05 1130) Antibody: Negative (12/05 1130) Rubella: <0.90 (12/05 1130) RPR: Non Reactive (04/05 0916)  HBsAg: Negative (12/05 1130)  HIV: Non Reactive (04/05 0916)  GBS: UNKNOWN 2 hr Glucola normal Genetic screening normal Anatomy US normal  Prenatal Transfer Tool  Maternal Diabetes: No Genetic Screening: Normal Maternal Ultrasounds/Referrals: Normal Fetal Ultrasounds or other Referrals:  None Maternal Substance Abuse:  No Significant Maternal Medications:  None Significant Maternal Lab Results: Lab values include: Other:  GBS UNK  Past Medical History:  Diagnosis Date  . Arthritis   . Asthma   . Gall bladder disease   . GERD (gastroesophageal reflux disease)    . Migraine   . MVP (mitral valve prolapse)   . Ovarian cyst     Past Surgical History:  Procedure Laterality Date  . APPENDECTOMY  2010  . CLOSED REDUCTION ELBOW DISLOCATION      OB History  Gravida Para Term Preterm AB Living  1 0 0 0 0 0  SAB TAB Ectopic Multiple Live Births  0 0 0 0 0    # Outcome Date GA Lbr Len/2nd Weight Sex Delivery Anes PTL Lv  1 Current             Social History   Socioeconomic History  . Marital status: Single    Spouse name: Not on file  . Number of children: Not on file  . Years of education: Not on file  . Highest education level: Not on file  Occupational History  . Not on file  Social Needs  . Financial resource strain: Not on file  . Food insecurity:    Worry: Not on file    Inability: Not on file  . Transportation needs:    Medical: Not on file    Non-medical: Not on file  Tobacco Use  . Smoking status: Former Smoker    Packs/day: 0.25    Types: Cigarettes    Last attempt to quit: 11/04/2017    Years since quitting: 0.2  . Smokeless tobacco: Never Used  Substance and Sexual Activity  . Alcohol use: No    Alcohol/week: 0.0 oz  . Drug use: No  . Sexual activity: Yes    Birth control/protection: None  Lifestyle  . Physical activity:    Days per week: Not on file    Minutes per session: Not on file  .  Stress: Not on file  Relationships  . Social connections:    Talks on phone: Not on file    Gets together: Not on file    Attends religious service: Not on file    Active member of club or organization: Not on file    Attends meetings of clubs or organizations: Not on file    Relationship status: Not on file  Other Topics Concern  . Not on file  Social History Narrative   Single. No children.   Works at [employer] station.   Enjoys being outdoors, going to parks.        Family History  Problem Relation Age of Onset  . Hypertension Maternal Aunt   . Hypertension Paternal Aunt   . Hypertension  Maternal Grandmother   . Drug abuse Maternal Grandmother   . Hypertension Maternal Grandfather   . Drug abuse Maternal Grandfather   . Hypertension Paternal Grandmother   . Drug abuse Paternal Grandmother   . Hypertension Paternal Grandfather   . Drug abuse Paternal Grandfather   . Anesthesia problems Neg Hx     Medications Prior to Admission  Medication Sig Dispense Refill Last Dose  . albuterol (PROVENTIL HFA;VENTOLIN HFA) 108 (90 BASE) MCG/ACT inhaler Inhale 2 puffs into the lungs every 6 (six) hours as needed for wheezing or shortness of breath.    Taking  . ferrous gluconate (FERGON) 324 MG tablet Take 1 tablet (324 mg total) by mouth daily with breakfast. 60 tablet 1 Taking  . Prenatal Vit-Fe Fumarate-FA (PRENATAL MULTIVITAMIN) TABS tablet Take 1 tablet by mouth daily at 12 noon.   Taking    No Known Allergies  Review of Systems: Negative except for what is mentioned in HPI.  Physical Exam: BP (!) 142/77   Pulse 65   Temp 97.9 F (36.6 C) (Oral)   Resp 18   Ht 5\' 9"  (1.753 m)   Wt 82.6 kg (182 lb)   LMP 06/04/2017   BMI 26.88 kg/m  CONSTITUTIONAL: Well-developed, well-nourished female in no acute distress.  HENT:  Normocephalic, atraumatic, External right and left ear normal. Oropharynx is clear and moist EYES: Conjunctivae and EOM are normal. Pupils are equal, round, and reactive to light. No scleral icterus.  NECK: Normal range of motion, supple, no masses SKIN: Skin is warm and dry. No rash noted. Not diaphoretic. No erythema. No pallor. NEUROLOGIC: Alert and oriented to person, place, and time. Normal reflexes, muscle tone coordination. No cranial nerve deficit noted. PSYCHIATRIC: Normal mood and affect. Normal behavior. Normal judgment and thought content. CARDIOVASCULAR: Normal heart rate noted, regular rhythm RESPIRATORY: Effort and breath sounds normal, no problems with respiration noted ABDOMEN: Soft, nontender, nondistended, gravid. MUSCULOSKELETAL: Normal  range of motion. No edema and no tenderness. 2+ distal pulses.  Cervical Exam: Dilation: 1 Effacement (%): 50 Cervical Position: Posterior Station: -3 Presentation: Vertex Exam by:: K.Wilson,RN   FHT:  Baseline rate 130 bpm   Variability moderate  Accelerations present   Decelerations none Contractions: Every 3-6 mins   Pertinent Labs/Studies:   Results for orders placed or performed during the hospital encounter of 02/18/18 (from the past 24 hour(s))  CBC     Status: Abnormal   Collection Time: 02/18/18  1:18 AM  Result Value Ref Range   WBC 12.1 (H) 4.0 - 10.5 K/uL   RBC 3.47 (L) 3.87 - 5.11 MIL/uL   Hemoglobin 11.0 (L) 12.0 - 15.0 g/dL   HCT 81.1 (L) 91.4 - 78.2 %  MCV 93.7 78.0 - 100.0 fL   MCH 31.7 26.0 - 34.0 pg   MCHC 33.8 30.0 - 36.0 g/dL   RDW 16.113.7 09.611.5 - 04.515.5 %   Platelets 319 150 - 400 K/uL  Comprehensive metabolic panel     Status: Abnormal   Collection Time: 02/18/18  1:18 AM  Result Value Ref Range   Sodium 137 135 - 145 mmol/L   Potassium 4.0 3.5 - 5.1 mmol/L   Chloride 109 101 - 111 mmol/L   CO2 18 (L) 22 - 32 mmol/L   Glucose, Bld 75 65 - 99 mg/dL   BUN 17 6 - 20 mg/dL   Creatinine, Ser 4.090.91 0.44 - 1.00 mg/dL   Calcium 8.7 (L) 8.9 - 10.3 mg/dL   Total Protein 6.3 (L) 6.5 - 8.1 g/dL   Albumin 2.7 (L) 3.5 - 5.0 g/dL   AST 67 (H) 15 - 41 U/L   ALT 89 (H) 14 - 54 U/L   Alkaline Phosphatase 234 (H) 38 - 126 U/L   Total Bilirubin 0.5 0.3 - 1.2 mg/dL   GFR calc non Af Amer >60 >60 mL/min   GFR calc Af Amer >60 >60 mL/min   Anion gap 10 5 - 15  Protein / creatinine ratio, urine     Status: None   Collection Time: 02/18/18  1:28 AM  Result Value Ref Range   Creatinine, Urine 45.00 mg/dL   Total Protein, Urine <6.00 mg/dL   Protein Creatinine Ratio        0.00 - 0.15 mg/mg[Cre]  Urinalysis, Routine w reflex microscopic     Status: Abnormal   Collection Time: 02/18/18  1:28 AM  Result Value Ref Range   Color, Urine YELLOW YELLOW   APPearance HAZY (A)  CLEAR   Specific Gravity, Urine 1.006 1.005 - 1.030   pH 5.0 5.0 - 8.0   Glucose, UA NEGATIVE NEGATIVE mg/dL   Hgb urine dipstick SMALL (A) NEGATIVE   Bilirubin Urine NEGATIVE NEGATIVE   Ketones, ur NEGATIVE NEGATIVE mg/dL   Protein, ur NEGATIVE NEGATIVE mg/dL   Nitrite NEGATIVE NEGATIVE   Leukocytes, UA NEGATIVE NEGATIVE   RBC / HPF 0-5 0 - 5 RBC/hpf   WBC, UA 0-5 0 - 5 WBC/hpf   Bacteria, UA RARE (A) NONE SEEN   Squamous Epithelial / LPF 6-10 0 - 5   Mucus PRESENT     Assessment : Dimas AlexandriaDesiree D Schaus is a 27 y.o. G1P0000 at 7073w0d being admitted for ?HEELP vs severe preeclampsia due to elevated LFTs with elevated BPs.    Plan: #Labor: Induction of labor with cytotec. Will consider placing FB as well.  #Preeclampsia vs HELLP: not meeting all criteria for help without hemolysis or thrombocytopenia. PCR normal. Mildly elevated BPs. Only has elevated LFTs. Will trend labs q8hrs. Start on Magnesium.  #Analgesia as needed. #FWB: Reassuring fetal heart tracing.   #GBS unknown; will collect PCR #Delivery plan: Hopeful for vaginal delivery   Caryl AdaJazma Phelps, DO OB Fellow Faculty Practice, [facility] 02/18/2018, 4:33 AM

## 2018-02-18 NOTE — Anesthesia Pain Management Evaluation Note (Signed)
  CRNA Pain Management Visit Note  Patient: Laurie [name], 27 y.o., female  "Hello I am a member of the anesthesia team at [facility]. We have an anesthesia team available at all times to provide care throughout the hospital, including epidural management and anesthesia for C-section. I don't know your plan for the delivery whether it a natural birth, water birth, IV sedation, nitrous supplementation, doula or epidural, but we want to meet your pain goals."   1.Was your pain managed to your expectations on prior hospitalizations?   No prior hospitalizations  2.What is your expectation for pain management during this hospitalization?     Epidural  3.How can we help you reach that goal? unsure  Record the patient's initial score and the patient's pain goal.   Pain: 7  Pain Goal: 9 [facility] wants you to be able to say your pain was always managed very well.  Cephus ShellingBURGER,Averly Ericson 02/18/2018

## 2018-02-18 NOTE — MAU Provider Note (Addendum)
History     CSN: [id number]  Arrival date and time: 02/18/18 7829   First Provider Initiated Contact with Patient 02/18/18 0114      Chief Complaint  Patient presents with  . Contractions   Laurie [name] is a 27 y.o. G1P0000 at [redacted]w[redacted]d who presents today for labor evaluation. She states that she has been having contractions since yesterday, but they have continued to get worse. She denies any VB or LOF. She reports normal fetal movement. She was found to have elevated blood pressure on evaluation. She denies any HA, visual disturbance or RUQ pain. She denies any complications with this pregnancy.   Pelvic Pain  The patient's primary symptoms include pelvic pain. The patient's pertinent negatives include no vaginal discharge. This is a new problem. The current episode started yesterday. The problem occurs intermittently. The problem has been gradually worsening. The pain is moderate. The problem affects both sides. She is pregnant. Pertinent negatives include no abdominal pain, chills, fever or headaches. The vaginal discharge was normal. There has been no bleeding. Nothing aggravates the symptoms. She has tried nothing for the symptoms.    Past Medical History:  Diagnosis Date  . Arthritis   . Asthma   . Gall bladder disease   . GERD (gastroesophageal reflux disease)   . Migraine   . MVP (mitral valve prolapse)   . Ovarian cyst     Past Surgical History:  Procedure Laterality Date  . APPENDECTOMY  2010  . CLOSED REDUCTION ELBOW DISLOCATION      Family History  Problem Relation Age of Onset  . Hypertension Maternal Aunt   . Hypertension Paternal Aunt   . Hypertension Maternal Grandmother   . Drug abuse Maternal Grandmother   . Hypertension Maternal Grandfather   . Drug abuse Maternal Grandfather   . Hypertension Paternal Grandmother   . Drug abuse Paternal Grandmother   . Hypertension Paternal Grandfather   . Drug abuse Paternal Grandfather   . Anesthesia problems Neg  Hx     Social History   Tobacco Use  . Smoking status: Former Smoker    Packs/day: 0.25    Types: Cigarettes    Last attempt to quit: 11/04/2017    Years since quitting: 0.2  . Smokeless tobacco: Never Used  Substance Use Topics  . Alcohol use: No    Alcohol/week: 0.0 oz  . Drug use: No    Allergies: No Known Allergies  Medications Prior to Admission  Medication Sig Dispense Refill Last Dose  . albuterol (PROVENTIL HFA;VENTOLIN HFA) 108 (90 BASE) MCG/ACT inhaler Inhale 2 puffs into the lungs every 6 (six) hours as needed for wheezing or shortness of breath.    Taking  . ferrous gluconate (FERGON) 324 MG tablet Take 1 tablet (324 mg total) by mouth daily with breakfast. 60 tablet 1 Taking  . Prenatal Vit-Fe Fumarate-FA (PRENATAL MULTIVITAMIN) TABS tablet Take 1 tablet by mouth daily at 12 noon.   Taking    Review of Systems  Constitutional: Negative for chills and fever.  Eyes: Negative for visual disturbance.  Gastrointestinal: Negative for abdominal pain.  Genitourinary: Positive for pelvic pain. Negative for vaginal bleeding and vaginal discharge.  Neurological: Negative for headaches.   Physical Exam   Blood pressure (!) 126/102, pulse 75, temperature 97.6 F (36.4 C), resp. rate 18, height 5\' 9"  (1.753 m), weight 182 lb (82.6 kg), last menstrual period 06/04/2017.  Physical Exam  Nursing note and vitals reviewed. Constitutional: She is oriented to person, place,  and time. She appears well-developed and well-nourished. No distress.  HENT:  Head: Normocephalic.  Cardiovascular: Normal rate.  Respiratory: Effort normal.  GI: Soft. There is no tenderness.  Neurological: She is alert and oriented to person, place, and time. She has normal reflexes. She exhibits normal muscle tone (no clonus ).  Skin: Skin is warm and dry.  Psychiatric: She has a normal mood and affect.   NST:  Baseline: 145 Variability: moderate Accels: 15x15 Decels: none Toco: q 4-5 mins     Results for orders placed or performed during the hospital encounter of 02/18/18 (from the past 24 hour(s))  CBC     Status: Abnormal   Collection Time: 02/18/18  1:18 AM  Result Value Ref Range   WBC 12.1 (H) 4.0 - 10.5 K/uL   RBC 3.47 (L) 3.87 - 5.11 MIL/uL   Hemoglobin 11.0 (L) 12.0 - 15.0 g/dL   HCT 16.1 (L) 09.6 - 04.5 %   MCV 93.7 78.0 - 100.0 fL   MCH 31.7 26.0 - 34.0 pg   MCHC 33.8 30.0 - 36.0 g/dL   RDW 40.9 81.1 - 91.4 %   Platelets 319 150 - 400 K/uL  Comprehensive metabolic panel     Status: Abnormal   Collection Time: 02/18/18  1:18 AM  Result Value Ref Range   Sodium 137 135 - 145 mmol/L   Potassium 4.0 3.5 - 5.1 mmol/L   Chloride 109 101 - 111 mmol/L   CO2 18 (L) 22 - 32 mmol/L   Glucose, Bld 75 65 - 99 mg/dL   BUN 17 6 - 20 mg/dL   Creatinine, Ser 7.82 0.44 - 1.00 mg/dL   Calcium 8.7 (L) 8.9 - 10.3 mg/dL   Total Protein 6.3 (L) 6.5 - 8.1 g/dL   Albumin 2.7 (L) 3.5 - 5.0 g/dL   AST 67 (H) 15 - 41 U/L   ALT 89 (H) 14 - 54 U/L   Alkaline Phosphatase 234 (H) 38 - 126 U/L   Total Bilirubin 0.5 0.3 - 1.2 mg/dL   GFR calc non Af Amer >60 >60 mL/min   GFR calc Af Amer >60 >60 mL/min   Anion gap 10 5 - 15  Protein / creatinine ratio, urine     Status: None   Collection Time: 02/18/18  1:28 AM  Result Value Ref Range   Creatinine, Urine 45.00 mg/dL   Total Protein, Urine <6.00 mg/dL   Protein Creatinine Ratio        0.00 - 0.15 mg/mg[Cre]  Urinalysis, Routine w reflex microscopic     Status: Abnormal   Collection Time: 02/18/18  1:28 AM  Result Value Ref Range   Color, Urine YELLOW YELLOW   APPearance HAZY (A) CLEAR   Specific Gravity, Urine 1.006 1.005 - 1.030   pH 5.0 5.0 - 8.0   Glucose, UA NEGATIVE NEGATIVE mg/dL   Hgb urine dipstick SMALL (A) NEGATIVE   Bilirubin Urine NEGATIVE NEGATIVE   Ketones, ur NEGATIVE NEGATIVE mg/dL   Protein, ur NEGATIVE NEGATIVE mg/dL   Nitrite NEGATIVE NEGATIVE   Leukocytes, UA NEGATIVE NEGATIVE   RBC / HPF 0-5 0 - 5  RBC/hpf   WBC, UA 0-5 0 - 5 WBC/hpf   Bacteria, UA RARE (A) NONE SEEN   Squamous Epithelial / LPF 6-10 0 - 5   Mucus PRESENT      MAU Course  Procedures  MDM 3:56 AM Consult with Dr. Debroah Loop. Will admit for HELLP   Assessment and Plan  HELLP 2573w0d   RN will call labor team and inform them of admission.   Thressa ShellerHeather Hogan 02/18/2018, 3:11 AM

## 2018-02-18 NOTE — Progress Notes (Signed)
Labor Progress Note Laurie [name] is a 27 y.o. G1P0000 at 6064w0d presented for IOL for severe preE (LFT elevation). S: Feeling more comfortable, s/p epidural placement.   O:  BP 125/72 (BP Location: Right Arm)   Pulse (!) 53   Temp 98.3 F (36.8 C) (Oral)   Resp 16   Ht 5\' 9"  (1.753 m)   Wt 82.6 kg (182 lb)   LMP 06/04/2017   SpO2 100%   BMI 26.88 kg/m  EFM: 120 bpm/moderate variability/+accel, no decels  CVE: Dilation: 5 Effacement (%): 70 Cervical Position: Posterior Station: -2 Presentation: Vertex Exam by:: Laurie BrazenJ Middleton RN   A&P: 27 y.o. G1P0000 9064w0d here for IOL for severe preE (LFTs). #Labor: s/p cytotec, and FB. SVE no 5/70/-2. Pitocin ordered. Plan for AROM when appropriate.  #Pain: per patient request #severe preE: based on elevated LFTs on admission (doubling from baseline) (AST 67/plt 319/Cr 0.91/pCr undetectable). 2 mild range pressures <4 hours apart while in MAU. Had 2 other mild range pressures today but mostly normotensive. Repeat labs stable. Continue labs q8h for now.  #FWB: Cat 1 FHT #GBS negative  Felicita GageBrittany P Jacob Cicero, MD 5:54 PM

## 2018-02-18 NOTE — Progress Notes (Signed)
Labor Progress Note Laurie [name] is a 27 y.o. G1P0000 at 5365w0d presented for IOL for severe preE (LFT elevation). S: Feeling pain with contractions. FB remains in place.   O:  BP (!) 148/101 (BP Location: Right Arm)   Pulse 75   Temp 98.3 F (36.8 C) (Oral)   Resp 16   Ht 5\' 9"  (1.753 m)   Wt 82.6 kg (182 lb)   LMP 06/04/2017   BMI 26.88 kg/m  EFM: 125 bpm/moderate variability/+accel, no decels  CVE: Dilation: 2 Effacement (%): 70 Cervical Position: Posterior Station: -2 Presentation: Vertex Exam by:: B Analeah Brame MD   A&P: 27 y.o. G1P0000 3565w0d here for IOL for severe preE (LFTs). #Labor: s/p cytotec, FB remians in place. Plan for pitocin and AROM when appropriate. Anticipate SVD. #Pain: per patient request #severe preE: based on elevated LFTs on admission (doubling from baseline) (AST 67/plt 319/Cr 0.91/pCr undetectable). 2 mild range pressures <4 hours apart while in MAU. Recently had another mild range pressure with pain but o/w normotesnive. Repeat labs stable. Continue labs q8h for now.  #FWB: Cat 1 FHT #GBS negative  GrenadaBrittany P Dexter Signor, MD 1:18 PM

## 2018-02-18 NOTE — Progress Notes (Signed)
Labor Progress Note Dimas AlexandriaDesiree D Grandpre is a 27 y.o. G1P0000 at 7532w0d presented for IOL for severe preE (LFT elevation). S: Feeling pain with contractions.  O:  BP 113/62   Pulse 68   Temp 98.4 F (36.9 C) (Oral)   Resp 16   Ht 5\' 9"  (1.753 m)   Wt 82.6 kg (182 lb)   LMP 06/04/2017   BMI 26.88 kg/m  EFM: 140 bpm/moderate variability/+accel, no decels  CVE: Dilation: 2 Effacement (%): 70 Cervical Position: Posterior Station: -2 Presentation: Vertex Exam by:: B Gussie Murton MD   A&P: 27 y.o. G1P0000 5432w0d here for IOL for severe preE (LFTs). #Labor: s/p cytotec, SVE now 2/70/-2. FB placed @ 0950. Plan for pitocin and AROM when appropriate. Anticipate SVD. #Pain: per patient request #severe preE: based on elevated LFTs on admission (doubling from baseline) (AST 67/plt 319/Cr 0.91/pCr undetectable). 2 mild range pressures <4 hours apart while in MAU. Bps now normotensive since arrival on floor. On magnesium. PIH labs q8h.  #FWB: Cat 1 FHT #GBS negative  Verdene LennertBrittany P Benjamine Strout, MD 9:54 AM

## 2018-02-19 MED ORDER — HYDROXYZINE HCL 50 MG PO TABS
50.0000 mg | ORAL_TABLET | Freq: Once | ORAL | Status: AC
  Administered 2018-02-19: 50 mg via ORAL
  Filled 2018-02-19: qty 1

## 2018-02-19 MED ORDER — ACETAMINOPHEN 500 MG PO TABS
1000.0000 mg | ORAL_TABLET | Freq: Once | ORAL | Status: AC
  Administered 2018-02-19: 1000 mg via ORAL
  Filled 2018-02-19: qty 2

## 2018-02-19 MED ORDER — SODIUM CHLORIDE 0.9 % IV SOLN
2.0000 g | Freq: Four times a day (QID) | INTRAVENOUS | Status: DC
  Administered 2018-02-19 – 2018-02-20 (×3): 2 g via INTRAVENOUS
  Filled 2018-02-19: qty 2000
  Filled 2018-02-19 (×2): qty 2
  Filled 2018-02-19: qty 2000
  Filled 2018-02-19: qty 2

## 2018-02-19 MED ORDER — GENTAMICIN SULFATE 40 MG/ML IJ SOLN
180.0000 mg | Freq: Three times a day (TID) | INTRAVENOUS | Status: DC
  Administered 2018-02-19 – 2018-02-20 (×2): 180 mg via INTRAVENOUS
  Filled 2018-02-19 (×3): qty 4.5

## 2018-02-19 MED ORDER — OXYTOCIN 40 UNITS IN LACTATED RINGERS INFUSION - SIMPLE MED: 1.0000 m[IU]/min | INTRAVENOUS | Status: DC

## 2018-02-19 NOTE — Progress Notes (Signed)
Labor Progress Note  Laurie [name] is a 27 y.o. G1P0000 at 1951w0d admitted for induction of labor due to Pre-eclamptic toxemia of pregnancy.  S: Comfortable with epidural. Resting. No concerns. Denies PIH symptoms (HA, scotomata, RUQ pain).  O:  BP (!) 110/57   Pulse 69   Temp 98.5 F (36.9 C) (Oral)   Resp 16   Ht 5\' 9"  (1.753 m)   Wt 82.6 kg (182 lb)   LMP 06/04/2017   SpO2 99%   BMI 26.88 kg/m   Total I/O In: 2250.7 [P.O.:1400; I.V.:766.7; Other:84] Out: 1950 [Urine:1950]  FHT:  FHR: 130 bpm, variability: moderate,  accelerations:  Present,  decelerations:  Absent UC:   regular, every 2-6 minutes SVE:   Dilation: 5.5 Effacement (%): 70 Station: -3 Exam by:: Doroteo GlassmanPhelps MD AROM: meconium 2237  Pitocin @ 14 mu/min  Labs: Lab Results  Component Value Date   WBC 12.8 (H) 02/18/2018   HGB 10.8 (L) 02/18/2018   HCT 32.3 (L) 02/18/2018   MCV 94.4 02/18/2018   PLT 331 02/18/2018    Assessment / Plan: 27 y.o. G1P0000 751w0d in early labor Induction of labor due to preeclampsia,  progressing well on pitocin  Labor: Progressing normally on Pitocin, will continue to increase. IUPC placed for better visualizations of contractions and adequacy. MVU's not adequte.  Preeclampsia: Labs have been stable. BPs stable; not in severe range. Asymptomatic. Appropriate intake/output. On Mag.  Fetal Wellbeing:  Category I Pain Control:  Epidural Anticipated MOD:  NSVD  Expectant management   Caryl AdaJazma Phelps, DO OB Fellow Center for [facility], [facility]

## 2018-02-19 NOTE — Progress Notes (Signed)
Laurie [name] is a 27 y.o. G1P0000 at 5160w1d by ultrasound admitted for induction of labor due to Pre-eclamptic toxemia of pregnancy..  Subjective:   Objective: BP (!) 147/94   Pulse 93   Temp 98.4 F (36.9 C) (Oral)   Resp 18   Ht 5\' 9"  (1.753 m)   Wt 182 lb (82.6 kg)   LMP 06/04/2017   SpO2 99%   BMI 26.88 kg/m  I/O last 3 completed shifts: In: 8172.3 [P.O.:4340; I.V.:3623.4; Other:208.8] Out: 7250 [Urine:7250] Total I/O In: 1850.3 [P.O.:1080; I.V.:700.3; Other:70] Out: 1025 [Urine:1025]  FHT:  FHR: 145 bpm, variability: moderate,  accelerations:  Abscent,  decelerations:  Absent UC:   irregular, every 2-3 mild intensity minutes SVE:   Dilation: 6 Effacement (%): 90 Station: 0 Exam by:: Estée LauderLawson CNM  Labs: Lab Results  Component Value Date   WBC 12.8 (H) 02/18/2018   HGB 10.8 (L) 02/18/2018   HCT 32.3 (L) 02/18/2018   MCV 94.4 02/18/2018   PLT 331 02/18/2018    Assessment / Plan: Induction of labor due to preeclampsia,  progressing well on pitocin  Labor: progree has been slow secondary to progree has been hampered by tocolytic effects of MGSO4 Preeclampsia:  on magnesium sulfate Fetal Wellbeing:  Category I Pain Control:  Labor support without medications I/D:  n/a Anticipated MOD:  NSVD  Laurie [name] 02/19/2018, 1:20 PM

## 2018-02-19 NOTE — Progress Notes (Signed)
LABOR PROGRESS NOTE  Dimas AlexandriaDesiree D Strahm is a 27 y.o. G1P0000 at 3559w1d  admitted for PreE   Subjective: Pt tired and hungry.  Objective: BP 128/79 (BP Location: Right Arm)   Pulse 95   Temp 99.4 F (37.4 C) (Oral)   Resp 16   Ht 5\' 9"  (1.753 m)   Wt 182 lb (82.6 kg)   LMP 06/04/2017   SpO2 100%   BMI 26.88 kg/m  or  Vitals:   02/19/18 2130 02/19/18 2200 02/19/18 2230 02/19/18 2300  BP: 124/80 140/86 133/74 128/79  Pulse: 92 93 97 95  Resp: 14 17 16 16   Temp:  99.4 F (37.4 C)    TempSrc:  Oral    SpO2: 98% 100% 100% 100%  Weight:      Height:       Dilation: Lip/rim Effacement (%): 100 Cervical Position: Middle Station: Plus 1 Presentation: Vertex Exam by:: Degele, MD FHT: 140, min var, no decel, no accel, reactive to scalp stim Uterine activity: every 5-6  Labs: Lab Results  Component Value Date   WBC 12.8 (H) 02/18/2018   HGB 10.8 (L) 02/18/2018   HCT 32.3 (L) 02/18/2018   MCV 94.4 02/18/2018   PLT 331 02/18/2018    Patient Active Problem List   Diagnosis Date Noted  . HELLP (hemolytic anemia/elev liver enzymes/low platelets in pregnancy) 02/18/2018  . Trichomonal vaginitis during pregnancy, antepartum 09/14/2017  . Tobacco use affecting pregnancy, antepartum 09/14/2017  . Rubella non-immune status, antepartum 08/11/2017  . Supervision of other normal pregnancy, antepartum 08/09/2017  . Tenosynovitis 07/03/2015    Assessment / Plan: 27 y.o. G1P0000 at 5859w1d here for IOL for PreE  Labor: slowly progressing on Mg, cont to increase Pit Gtt Fetal Wellbeing:  Cat II Pain Control:  Epidural Anticipated MOD:  NSVD  Garnette GunnerAaron B Rosielee Corporan, MD PGY-1 6/16/201911:31 PM

## 2018-02-19 NOTE — Progress Notes (Signed)
LABOR PROGRESS NOTE  Dimas AlexandriaDesiree D Weathers is a 27 y.o. G1P0000 at 533w1d  admitted for PreE   Subjective: Pt is comfortable, sleeping in bed.  Objective: BP 124/80 (BP Location: Right Arm)   Pulse 92   Temp 99.4 F (37.4 C) (Oral)   Resp 14   Ht 5\' 9"  (1.753 m)   Wt 182 lb (82.6 kg)   LMP 06/04/2017   SpO2 98%   BMI 26.88 kg/m  or  Vitals:   02/19/18 2000 02/19/18 2030 02/19/18 2100 02/19/18 2130  BP: (!) 124/58 128/78 126/83 124/80  Pulse: 98 (!) 101 97 92  Resp: 15 16 15 14   Temp: 99.4 F (37.4 C)     TempSrc: Oral     SpO2: 95% 97% 99% 98%  Weight:      Height:       Dilation: Lip/rim Effacement (%): 100 Cervical Position: Middle Station: Plus 1 Presentation: Vertex Exam by:: Janee Mornhompson, MD FHT: 140, min var, no decel, no accel, reactive to scalp stim Uterine activity: every 4-6 min  Labs: Lab Results  Component Value Date   WBC 12.8 (H) 02/18/2018   HGB 10.8 (L) 02/18/2018   HCT 32.3 (L) 02/18/2018   MCV 94.4 02/18/2018   PLT 331 02/18/2018    Patient Active Problem List   Diagnosis Date Noted  . HELLP (hemolytic anemia/elev liver enzymes/low platelets in pregnancy) 02/18/2018  . Trichomonal vaginitis during pregnancy, antepartum 09/14/2017  . Tobacco use affecting pregnancy, antepartum 09/14/2017  . Rubella non-immune status, antepartum 08/11/2017  . Supervision of other normal pregnancy, antepartum 08/09/2017  . Tenosynovitis 07/03/2015    Assessment / Plan: 27 y.o. G1P0000 at 293w1d here for IOL for PreE  Labor: slowly progressing on Mg, cont to increase Pit Gtt, currently 9 ml/hr Fetal Wellbeing:  Cat II Pain Control:  Epidural Anticipated MOD:  NSVD  Garnette GunnerAaron B Thompson, MD PGY-1 6/16/20199:46 PM

## 2018-02-19 NOTE — Progress Notes (Signed)
Labor Progress Note  Laurie [name] is a 27 y.o. G1P0000 at 5864w0d admitted for induction of labor due to Pre-eclamptic toxemia of pregnancy.  S: Comfortable with epidural. Resting. No concerns. Denies PIH symptoms (HA, scotomata, RUQ pain).  O:  BP 122/75   Pulse 74   Temp 98 F (36.7 C) (Oral)   Resp 16   Ht 5\' 9"  (1.753 m)   Wt 82.6 kg (182 lb)   LMP 06/04/2017   SpO2 99%   BMI 26.88 kg/m   Total I/O In: 3095.8 [P.O.:1840; I.V.:1157.6; Other:98.2] Out: 2950 [Urine:2950]  FHT:  FHR: 135 bpm, variability: moderate,  accelerations:  Present,  decelerations:  Absent UC:   regular, every 2-3 minutes SVE:   Dilation: 6 Effacement (%): 90 Station: -1 Exam by:: Doroteo GlassmanPhelps MD AROM: meconium 2237  Pitocin @ 24 mu/min  Labs: Lab Results  Component Value Date   WBC 12.8 (H) 02/18/2018   HGB 10.8 (L) 02/18/2018   HCT 32.3 (L) 02/18/2018   MCV 94.4 02/18/2018   PLT 331 02/18/2018    Assessment / Plan: 27 y.o. G1P0000 6764w0d in early labor Induction of labor due to preeclampsia,  progressing well on pitocin  Labor: Progressing normally on Pitocin, will continue to increase. IUPC in place but may not be working effectively as contractions not adequate. Likely some Magnesium effect.  Preeclampsia: Labs have been stable. BPs stable; not in severe range. Asymptomatic. Appropriate intake/output. On Mag.  Fetal Wellbeing:  Category I Pain Control:  Epidural Anticipated MOD:  NSVD  Expectant management   Caryl AdaJazma Meshell Abdulaziz, DO OB Fellow Center for [facility], [facility]

## 2018-02-19 NOTE — Progress Notes (Signed)
Laurie [name] is a 27 y.o. G1P0000 at 4484w1d by ultrasound admitted for induction of labor due to Pre-eclamptic toxemia of pregnancy..  Subjective:   Objective: BP 103/70   Pulse 98   Temp 98.4 F (36.9 C) (Oral)   Resp 18   Ht 5\' 9"  (1.753 m)   Wt 182 lb (82.6 kg)   LMP 06/04/2017   SpO2 99%   BMI 26.88 kg/m  I/O last 3 completed shifts: In: 8172.3 [P.O.:4340; I.V.:3623.4; Other:208.8] Out: 7250 [Urine:7250] Total I/O In: 539 [P.O.:240; I.V.:271; Other:28] Out: 575 [Urine:575]  FHT:  FHR: 120 bpm, variability: moderate,  accelerations:  Present,  decelerations:  Absent UC:   Irritable type pattern SVE:   Dilation: 6 Effacement (%): 90 Station: 0 Exam by:: Estée LauderLawson CNM  Labs: Lab Results  Component Value Date   WBC 12.8 (H) 02/18/2018   HGB 10.8 (L) 02/18/2018   HCT 32.3 (L) 02/18/2018   MCV 94.4 02/18/2018   PLT 331 02/18/2018    Assessment / Plan: Induction of labor due to preeclampsia,  progressing well on pitocin  Labor: slow progress Preeclampsia:  on magnesium sulfate Fetal Wellbeing:  Category I Pain Control:  IV pain meds I/D:  n/a Anticipated MOD:  NSVD  Laurie [name] 02/19/2018, 10:07 AM

## 2018-02-19 NOTE — Progress Notes (Signed)
LABOR PROGRESS NOTE  Laurie [name] is a 27 y.o. G1P0000 at 8645w1d  admitted for PreE   Subjective: Pt is comfortable. Now afebrile. No CP, SOB, HA, Visual changes  Objective: BP 128/78 (BP Location: Right Arm)   Pulse (!) 101   Temp 99.4 F (37.4 C) (Oral)   Resp 16   Ht 5\' 9"  (1.753 m)   Wt 182 lb (82.6 kg)   LMP 06/04/2017   SpO2 97%   BMI 26.88 kg/m  or  Vitals:   02/19/18 1930 02/19/18 1942 02/19/18 2000 02/19/18 2030  BP: 138/76  (!) 124/58 128/78  Pulse: (!) 102  98 (!) 101  Resp: 16 16 15 16   Temp:   99.4 F (37.4 C)   TempSrc:   Oral   SpO2:  96% 95% 97%  Weight:      Height:       Dilation: Lip/rim Effacement (%): 100 Cervical Position: Middle Station: 0 Presentation: Vertex Exam by:: Luz BrazenJ Middleton RN FHT: 140, min var, no accel, no decel Uterine activity: every 6-10 min  Labs: Lab Results  Component Value Date   WBC 12.8 (H) 02/18/2018   HGB 10.8 (L) 02/18/2018   HCT 32.3 (L) 02/18/2018   MCV 94.4 02/18/2018   PLT 331 02/18/2018    Patient Active Problem List   Diagnosis Date Noted  . HELLP (hemolytic anemia/elev liver enzymes/low platelets in pregnancy) 02/18/2018  . Trichomonal vaginitis during pregnancy, antepartum 09/14/2017  . Tobacco use affecting pregnancy, antepartum 09/14/2017  . Rubella non-immune status, antepartum 08/11/2017  . Supervision of other normal pregnancy, antepartum 08/09/2017  . Tenosynovitis 07/03/2015    Assessment / Plan: 27 y.o. G1P0000 at 1245w1d here for IOL for PreE  Labor: slowly progressing on Mg, cont to increase Pit Gtt, currently 9 ml/hr Fetal Wellbeing:  Cat II Pain Control:  Epidural Anticipated MOD:  NSVD  Garnette GunnerAaron B Thompson, MD PGY-1 6/16/20199:02 PM

## 2018-02-19 NOTE — Plan of Care (Signed)
Pt progressing and POC is understood between pt and RN

## 2018-02-19 NOTE — Progress Notes (Signed)
Pharmacy Antibiotic Note  Laurie [name] is a 27 y.o. female admitted on 02/18/2018 with preeclamptic toxemia of pregnancy & now at 574w1d.  Pharmacy has been consulted for Gentamic dosing for maternal temp while in labor..  Plan: 1) Start Gentamicin 180mg  IV Q8 hours 2) Will follow & will check levels if needed  Height: 5\' 9"  (175.3 cm) Weight: 182 lb (82.6 kg) IBW/kg (Calculated) : 66.2  Temp (24hrs), Avg:98.5 F (36.9 C), Min:97.8 F (36.6 C), Max:100 F (37.8 C)  Recent Labs  Lab 02/18/18 0118 02/18/18 0943 02/18/18 1530 02/18/18 1744  WBC 12.1* 11.0* 11.9* 12.8*  CREATININE 0.91 0.85  --  0.86    Estimated Creatinine Clearance: 114.9 mL/min (by C-G formula based on SCr of 0.86 mg/dL).    No Known Allergies  Antimicrobials this admission: 02/19/18 Ampicillin 2g IV Q6 >>    Thank you for allowing pharmacy to be a part of this patient's care.  Hovey-Rankin, Madeliene Tejera 02/19/2018 5:39 PM

## 2018-02-20 ENCOUNTER — Encounter (HOSPITAL_COMMUNITY): Payer: Self-pay

## 2018-02-20 DIAGNOSIS — O99824 Streptococcus B carrier state complicating childbirth: Secondary | ICD-10-CM

## 2018-02-20 DIAGNOSIS — O1424 HELLP syndrome, complicating childbirth: Secondary | ICD-10-CM

## 2018-02-20 DIAGNOSIS — Z3A37 37 weeks gestation of pregnancy: Secondary | ICD-10-CM

## 2018-02-20 DIAGNOSIS — O1414 Severe pre-eclampsia complicating childbirth: Secondary | ICD-10-CM

## 2018-02-20 LAB — CBC
HCT: 31.2 % — ABNORMAL LOW (ref 36.0–46.0)
HEMATOCRIT: 29.3 % — AB (ref 36.0–46.0)
HEMOGLOBIN: 10.2 g/dL — AB (ref 12.0–15.0)
Hemoglobin: 10.4 g/dL — ABNORMAL LOW (ref 12.0–15.0)
MCH: 31.6 pg (ref 26.0–34.0)
MCH: 32.1 pg (ref 26.0–34.0)
MCHC: 33.3 g/dL (ref 30.0–36.0)
MCHC: 34.8 g/dL (ref 30.0–36.0)
MCV: 94.8 fL (ref 78.0–100.0)
MCV: 92.1 fL (ref 78.0–100.0)
Platelets: 282 10*3/uL (ref 150–400)
Platelets: 308 10*3/uL (ref 150–400)
RBC: 3.29 MIL/uL — AB (ref 3.87–5.11)
RBC: 3.18 MIL/uL — AB (ref 3.87–5.11)
RDW: 13.7 % (ref 11.5–15.5)
RDW: 13.6 % (ref 11.5–15.5)
WBC: 26.1 10*3/uL — ABNORMAL HIGH (ref 4.0–10.5)
WBC: 34.3 10*3/uL — ABNORMAL HIGH (ref 4.0–10.5)

## 2018-02-20 LAB — COMPREHENSIVE METABOLIC PANEL
ALT: 57 U/L — AB (ref 14–54)
AST: 41 U/L (ref 15–41)
Albumin: 2.3 g/dL — ABNORMAL LOW (ref 3.5–5.0)
Alkaline Phosphatase: 223 U/L — ABNORMAL HIGH (ref 38–126)
Anion gap: 11 (ref 5–15)
BILIRUBIN TOTAL: 0.5 mg/dL (ref 0.3–1.2)
BUN: 6 mg/dL (ref 6–20)
CHLORIDE: 98 mmol/L — AB (ref 101–111)
CO2: 18 mmol/L — ABNORMAL LOW (ref 22–32)
CREATININE: 0.99 mg/dL (ref 0.44–1.00)
Calcium: 6.8 mg/dL — ABNORMAL LOW (ref 8.9–10.3)
Glucose, Bld: 153 mg/dL — ABNORMAL HIGH (ref 65–99)
Potassium: 3.9 mmol/L (ref 3.5–5.1)
Sodium: 127 mmol/L — ABNORMAL LOW (ref 135–145)
TOTAL PROTEIN: 5.8 g/dL — AB (ref 6.5–8.1)

## 2018-02-20 MED ORDER — MAGNESIUM SULFATE 40 G IN LACTATED RINGERS - SIMPLE
2.0000 g/h | INTRAVENOUS | Status: DC
  Administered 2018-02-20: 2 g/h via INTRAVENOUS
  Filled 2018-02-20: qty 500
  Filled 2018-02-20: qty 40

## 2018-02-20 MED ORDER — SENNOSIDES-DOCUSATE SODIUM 8.6-50 MG PO TABS
2.0000 | ORAL_TABLET | ORAL | Status: DC
  Filled 2018-02-20: qty 2

## 2018-02-20 MED ORDER — CYCLOBENZAPRINE HCL 10 MG PO TABS
10.0000 mg | ORAL_TABLET | Freq: Three times a day (TID) | ORAL | Status: DC | PRN
  Administered 2018-02-20: 10 mg via ORAL
  Filled 2018-02-20 (×2): qty 1

## 2018-02-20 MED ORDER — BENZOCAINE-MENTHOL 20-0.5 % EX AERO
1.0000 "application " | INHALATION_SPRAY | CUTANEOUS | Status: DC | PRN
  Administered 2018-02-22: 1 via TOPICAL
  Filled 2018-02-20: qty 56

## 2018-02-20 MED ORDER — ONDANSETRON HCL 4 MG PO TABS: 4.0000 mg | ORAL_TABLET | ORAL | Status: DC | PRN

## 2018-02-20 MED ORDER — WITCH HAZEL-GLYCERIN EX PADS: 1.0000 "application " | MEDICATED_PAD | CUTANEOUS | Status: DC | PRN

## 2018-02-20 MED ORDER — TETANUS-DIPHTH-ACELL PERTUSSIS 5-2.5-18.5 LF-MCG/0.5 IM SUSP: 0.5000 mL | Freq: Once | INTRAMUSCULAR | Status: DC

## 2018-02-20 MED ORDER — SIMETHICONE 80 MG PO CHEW: 80.0000 mg | CHEWABLE_TABLET | ORAL | Status: DC | PRN

## 2018-02-20 MED ORDER — IBUPROFEN 600 MG PO TABS
600.0000 mg | ORAL_TABLET | Freq: Four times a day (QID) | ORAL | Status: DC
  Administered 2018-02-20 – 2018-02-21 (×4): 600 mg via ORAL
  Filled 2018-02-20 (×5): qty 1

## 2018-02-20 MED ORDER — DIBUCAINE 1 % RE OINT: 1.0000 "application " | TOPICAL_OINTMENT | RECTAL | Status: DC | PRN

## 2018-02-20 MED ORDER — NICOTINE 21 MG/24HR TD PT24
21.0000 mg | MEDICATED_PATCH | Freq: Every day | TRANSDERMAL | Status: DC
  Administered 2018-02-20: 21 mg via TRANSDERMAL
  Filled 2018-02-20 (×3): qty 1

## 2018-02-20 MED ORDER — ACETAMINOPHEN 325 MG PO TABS: 650.0000 mg | ORAL_TABLET | ORAL | Status: DC | PRN

## 2018-02-20 MED ORDER — MISOPROSTOL 200 MCG PO TABS
ORAL_TABLET | ORAL | Status: AC
  Administered 2018-02-20: 1000 ug via RECTAL
  Filled 2018-02-20: qty 5

## 2018-02-20 MED ORDER — LACTATED RINGERS IV SOLN: INTRAVENOUS | Status: DC

## 2018-02-20 MED ORDER — MISOPROSTOL 200 MCG PO TABS
1000.0000 ug | ORAL_TABLET | Freq: Once | ORAL | Status: AC
  Administered 2018-02-20: 1000 ug via RECTAL

## 2018-02-20 MED ORDER — GENTAMICIN SULFATE 40 MG/ML IJ SOLN
180.0000 mg | Freq: Three times a day (TID) | INTRAVENOUS | Status: AC
  Administered 2018-02-20: 180 mg via INTRAVENOUS
  Filled 2018-02-20: qty 4.5

## 2018-02-20 MED ORDER — DIPHENHYDRAMINE HCL 25 MG PO CAPS: 25.0000 mg | ORAL_CAPSULE | Freq: Four times a day (QID) | ORAL | Status: DC | PRN

## 2018-02-20 MED ORDER — COCONUT OIL OIL: 1.0000 "application " | TOPICAL_OIL | Status: DC | PRN

## 2018-02-20 MED ORDER — ONDANSETRON HCL 4 MG/2ML IJ SOLN: 4.0000 mg | INTRAMUSCULAR | Status: DC | PRN

## 2018-02-20 MED ORDER — METHYLERGONOVINE MALEATE 0.2 MG/ML IJ SOLN
INTRAMUSCULAR | Status: AC
  Filled 2018-02-20: qty 1

## 2018-02-20 MED ORDER — ZOLPIDEM TARTRATE 5 MG PO TABS: 5.0000 mg | ORAL_TABLET | Freq: Every evening | ORAL | Status: DC | PRN

## 2018-02-20 MED ORDER — ACETAMINOPHEN 500 MG PO TABS: 1000.0000 mg | ORAL_TABLET | Freq: Four times a day (QID) | ORAL | Status: DC | PRN

## 2018-02-20 MED ORDER — PRENATAL MULTIVITAMIN CH
1.0000 | ORAL_TABLET | Freq: Every day | ORAL | Status: DC
  Administered 2018-02-21: 1 via ORAL
  Filled 2018-02-20: qty 1

## 2018-02-20 MED FILL — Medication: Qty: 1 | Status: AC

## 2018-02-20 NOTE — Progress Notes (Signed)
Dr. Vergie LivingPickens at bedside at patient/family member request to discuss perinatal loss.

## 2018-02-20 NOTE — Progress Notes (Signed)
OB Note  Discussed situation with mom and support persons in the room and condolences given. Any questions answered to the best of my ability and mom appreciative of support of hospital and staff. Chaplain in to see patient  Laurie [name], Jr MD Attending Center for [facility] (Faculty Practice) 02/20/2018 Time: [phone]

## 2018-02-20 NOTE — Progress Notes (Signed)
L&D Note  02/20/2018 - 1:11 AM  27 y.o. G1P0000 [redacted]w[redacted]d. Pregnancy complicated by patient non compliance, trich w/ neg toc, tobacco abuse.   Patient Active Problem List   Diagnosis Date Noted  . HELLP (hemolytic anemia/elev liver enzymes/low platelets in pregnancy) 02/18/2018  . Trichomonal vaginitis during pregnancy, antepartum 09/14/2017  . Tobacco use affecting pregnancy, antepartum 09/14/2017  . Rubella non-immune status, antepartum 08/11/2017  . Supervision of other normal pregnancy, antepartum 08/09/2017  . Tenosynovitis 07/03/2015    Ms. CATELIN MANTHE is admitted for atypical HELLP on early morning on 6/15 during a labor evaluation   Subjective:  Pt comfortable with epidural.  Objective:   Vitals:   02/19/18 2330 02/20/18 0000 02/20/18 0030 02/20/18 0100  BP: (!) 144/98 (!) 141/79 (!) 147/88 (!) 156/98  Pulse: 92 90 (!) 115 (!) 103  Resp: 15 17    Temp:  98.4 F (36.9 C)    TempSrc:  Oral    SpO2: 100% 99% 97% 99%  Weight:      Height:        Current Vital Signs 24h Vital Sign Ranges  T 98.4 F (36.9 C) Temp  Avg: 98.8 F (37.1 C)  Min: 98 F (36.7 C)  Max: 100 F (37.8 C)  BP (!) 156/98 BP  Min: 103/70  Max: 156/98  HR (!) 103 Pulse  Avg: 89.4  Min: 66  Max: 115  RR 17 Resp  Avg: 16.4  Min: 14  Max: 20  SaO2 99 % (none-room air) SpO2  Avg: 98.3 %  Min: 95 %  Max: 100 %       24 Hour I/O Current Shift I/O  Time Ins Outs 06/16 0701 - 06/17 0700 In: 6035.7 [P.O.:3440; I.V.:2291.7] Out: 2880 [Urine:2880] 06/16 1901 - 06/17 0700 In: 847 [I.V.:697] Out: 675 [Urine:675]   UOP: approx 120mL/hr  FHR: 130 baseline, ?accels, early decels, mod variability Toco: q2-59m Gen: NAD, pt comfortable with UCs GU: normal UOP in tube SVE: complete/0 station with mild caput. Unable to tell position Last bit of anterior lip reduced with pushing and stays reduced.   Labs:  Recent Labs  Lab 02/18/18 0943 02/18/18 1530 02/18/18 1744  WBC 11.0* 11.9* 12.8*  HGB 10.6*  10.0* 10.8*  HCT 31.4* 29.8* 32.3*  PLT 307 296 331   Recent Labs  Lab 02/18/18 0118 02/18/18 0943 02/18/18 1744  NA 137 136 133*  K 4.0 3.6 4.2  CL 109 106 103  CO2 18* 22 21*  BUN 17 13 11   CREATININE 0.91 0.85 0.86  CALCIUM 8.7* 8.1* 7.7*  PROT 6.3* 6.5 5.9*  BILITOT 0.5 0.1* 0.1*  ALKPHOS 234* 213* 219*  ALT 89* 83* 77*  AST 67* 61* 55*  GLUCOSE 75 91 141*    Medications Current Facility-Administered Medications  Medication Dose Route Frequency Provider Last Rate Last Dose  . acetaminophen (TYLENOL) tablet 650 mg  650 mg Oral Q4H PRN Armando Reichert, CNM      . ampicillin (OMNIPEN) 2 g in sodium chloride 0.9 % 100 mL IVPB  2 g Intravenous Q6H Montez Morita, CNM 300 mL/hr at 02/20/18 0032 2 g at 02/20/18 0032  . ePHEDrine injection 10 mg  10 mg Intravenous PRN Beryle Lathe, MD      . ePHEDrine injection 10 mg  10 mg Intravenous PRN Beryle Lathe, MD      . fentaNYL (SUBLIMAZE) injection 50-100 mcg  50-100 mcg Intravenous Q1H PRN Armando Reichert, CNM  100 mcg at 02/18/18 1543  . fentaNYL 2.5 mcg/ml w/bupivacaine 0.1% in NS 100ml epidural infusion (WH-ANES)  14 mL/hr Epidural Continuous PRN Beryle LatheBrock, Thomas E, MD 14 mL/hr at 02/19/18 1942 14 mL/hr at 02/19/18 1942  . gentamicin (GARAMYCIN) 180 mg in dextrose 5 % 50 mL IVPB  180 mg Intravenous Q8H [place] BingPickens, Kerilyn Cortner, MD 109 mL/hr at 02/19/18 1839 180 mg at 02/19/18 1839  . hydrALAZINE (APRESOLINE) injection 10 mg  10 mg Intravenous Once PRN Pincus LargePhelps, Jazma Y, DO      . labetalol (NORMODYNE,TRANDATE) injection 20-80 mg  20-80 mg Intravenous Q10 min PRN Doroteo GlassmanPhelps, Jazma Y, DO      . lactated ringers infusion 500 mL  500 mL Intravenous Once Beryle LatheBrock, Thomas E, MD      . lactated ringers infusion 500-1,000 mL  500-1,000 mL Intravenous PRN Thressa ShellerHogan, Heather D, CNM 1,000 mL/hr at 02/18/18 1505 500 mL at 02/18/18 1505  . lactated ringers infusion   Intravenous Continuous Armando ReichertHogan, Heather D, CNM 100 mL/hr at 02/19/18 1602 100 mL/hr at 02/19/18  1602  . lidocaine (PF) (XYLOCAINE) 1 % injection 30 mL  30 mL Subcutaneous PRN Thressa ShellerHogan, Heather D, CNM      . magnesium sulfate 40 grams in LR 500 mL OB infusion  2 g/hr Intravenous Continuous Pincus Largehelps, Jazma Y, DO 25 mL/hr at 02/19/18 2237 2 g/hr at 02/19/18 2237  . misoprostol (CYTOTEC) tablet 50 mcg  50 mcg Buccal Q4H PRN Pincus Largehelps, Jazma Y, DO   50 mcg at 02/18/18 0535  . nicotine (NICODERM CQ - dosed in mg/24 hours) patch 14 mg  14 mg Transdermal Daily Caryl AdaPhelps, Jazma Y, DO   14 mg at 02/19/18 1003  . ondansetron (ZOFRAN) injection 4 mg  4 mg Intravenous Q6H PRN Armando ReichertHogan, Heather D, CNM      . oxyCODONE-acetaminophen (PERCOCET/ROXICET) 5-325 MG per tablet 1 tablet  1 tablet Oral Q4H PRN Armando ReichertHogan, Heather D, CNM      . oxyCODONE-acetaminophen (PERCOCET/ROXICET) 5-325 MG per tablet 2 tablet  2 tablet Oral Q4H PRN Armando ReichertHogan, Heather D, CNM      . oxytocin (PITOCIN) IV BOLUS FROM BAG  500 mL Intravenous Once Armando ReichertHogan, Heather D, CNM      . oxytocin (PITOCIN) IV infusion 40 units in LR 1000 mL - Premix  2.5 Units/hr Intravenous Continuous Armando ReichertHogan, Heather D, CNM      . oxytocin (PITOCIN) IV infusion 40 units in LR 1000 mL - Premix  1-40 milli-units/min Intravenous Titrated Garnette Gunnerhompson, Aaron B, MD 22.5 mL/hr at 02/20/18 0030 15 milli-units/min at 02/20/18 0030  . PHENYLephrine 40 mcg/ml in normal saline Adult IV Push Syringe  80 mcg Intravenous PRN Beryle LatheBrock, Thomas E, MD      . PHENYLephrine 40 mcg/ml in normal saline Adult IV Push Syringe  80 mcg Intravenous PRN Beryle LatheBrock, Thomas E, MD   80 mcg at 02/18/18 1616  . sodium citrate-citric acid (ORACIT) solution 30 mL  30 mL Oral Q2H PRN Armando ReichertHogan, Heather D, CNM      . terbutaline (BRETHINE) injection 0.25 mg  0.25 mg Subcutaneous Once PRN Pincus LargePhelps, Jazma Y, DO       Facility-Administered Medications Ordered in Other Encounters  Medication Dose Route Frequency Provider Last Rate Last Dose  . lidocaine (PF) (XYLOCAINE) 1 % injection    Anesthesia Intra-op Beryle LatheBrock, Thomas E, MD   6 mL at  02/18/18 1602    Assessment & Plan:  Pt progressing slowly *IUP: category I tracing *Labor: will continue pushing. Will reassess in  30-85m and try to assess position again. Pt is candidate for ovd if gets fetus down lower. Pelvis feels adequate. efw approx 3300gm *atypical hellp: cont Mg. Not needing any meds for bp. Will rpt labs *Chorio: cont amp and gent.  *GBS: neg by pcr *Analgesia: epidural working well.   Cornelia Copa MD Attending Center for [facility])

## 2018-02-20 NOTE — Progress Notes (Signed)
Pt was lying in bed and awake when I arrived. She was holding baby Laurie [name] (remains). She was appropriately tearful. Her cousin, who was bedside tried to be supportive but was dealing w/her own extreme grief. [name] wanted prayer, which I offered. I learned that she, and another cousin who was bedside, was raised by her grandmother who arrived later. Her grandmother was very attentive to [facility]ire[facility] and pt sobbed as she was nurtured by grandmother. Another family member also offered prayer and tried to console pt w/words to make some sense of what had happened. Laurie [name] was surrounded by several family members and friends. I provided spiritual, emotional and grief support to pt and family. Please page if additional assistance is needed. Chaplain Marjory Liesamela Lexington Krotz Holder, MDiv   02/20/18 0600  Clinical Encounter Type  Visited With Patient and family together

## 2018-02-20 NOTE — Progress Notes (Signed)
L&D Note  Patient continues with pushing. AF VS normal and stable Feels DOA, +2  CBC Latest Ref Rng & Units 02/20/2018 02/18/2018 02/18/2018  WBC 4.0 - 10.5 K/uL 26.1(H) 12.8(H) 11.9(H)  Hemoglobin 12.0 - 15.0 g/dL 10.4(L) 10.8(L) 10.0(L)  Hematocrit 36.0 - 46.0 % 31.2(L) 32.3(L) 29.8(L)  Platelets 150 - 400 K/uL 282 331 296   CMP Latest Ref Rng & Units 02/20/2018 02/18/2018 02/18/2018  Glucose 65 - 99 mg/dL 409(W153(H) 119(J141(H) 91  BUN 6 - 20 mg/dL 6 11 13   Creatinine 0.44 - 1.00 mg/dL 4.780.99 2.950.86 6.210.85  Sodium 135 - 145 mmol/L 127(L) 133(L) 136  Potassium 3.5 - 5.1 mmol/L 3.9 4.2 3.6  Chloride 101 - 111 mmol/L 98(L) 103 106  CO2 22 - 32 mmol/L 18(L) 21(L) 22  Calcium 8.9 - 10.3 mg/dL 3.0(Q6.8(L) 7.7(L) 8.1(L)  Total Protein 6.5 - 8.1 g/dL 6.5(H5.8(L) 5.9(L) 6.5  Total Bilirubin 0.3 - 1.2 mg/dL 0.5 8.4(O0.1(L) 9.6(E0.1(L)  Alkaline Phos 38 - 126 U/L 223(H) 219(H) 213(H)  AST 15 - 41 U/L 41 55(H) 61(H)  ALT 14 - 54 U/L 57(H) 77(H) 83(H)   A/p: pt progressing well Continue current plan of care Anticipate SVD  Cornelia Copaharlie Evelin Cake, Jr MD Attending Center for [facility] (Faculty Practice) 02/20/2018 Time: [phone]

## 2018-02-20 NOTE — Consult Note (Signed)
Delivery Note    Requested by Dr. Nira Retortegele to attend this vaginal delivery at 37 [redacted] weeks GA due to chorioamnionitis, magnesium exposure and anticipated instrumented delivery. Born to a G1P0 mother with pregnancy complicated by severe preeclampsia, Trich diagnosed and treated with TOC, tobacco use, rubella non-immune.  Labor induced due to preeclampsia and treated with magnesium sulfate.   Labor complicated by chorioamnionitis, maternal fever, prolonged second stage and PROM.  AROM occurred 29.5 hours prior to delivery with meconium stained fluid.  Vacuum assisted delivery.   At delivery, the baby was apneic and floppy.  He was immediately brought over to the warmer.  He had no heart rate, was pale and had no tone. We immediately began PPV via neopuff and prepared to intubate.  Chest compressions started at about 1.5 minutes of life and a Code Apgar was called.  He was intubated on the first attempt at about 2 minutes of life with breath sounds auscultated over both lung fields. There was no colometric change which I attributed to asystole however I performed direct laryngoscopy and confirmed placement of the ET tube by visualizing the tube passing through the vocal cords.   After endotracheal tube placement we gave epinephrine via the ET tube. A UVC was placed by 5 minutes of life and we gave epinephrine via the UVC every three minutes for a total of 5 doses of epinephrine (4 via the UVC).  After the second dose of epinephrine given via the UVC we gave a 10 mL/kg bolus of normal saline. We performed periodic heart rate checks and placed EKG electrodes however did not appreciate a heart rate.  I performed direct laryngoscopy several times during the code and each time visualized the ET tube passing through the vocal cords and also auscultated breath sounds bilaterally. After 15 minutes of resuscitation with at total 5 doses of epinephrine given, a normal saline bolus and no heart rate detected at any point  during the resuscitation, I made the decision to cease resuscitation attempts.  Apgars 0 at 1 minute, 0 at 5 minutes, 0 at 10 minutes, 0 at 15 minutes.   After the resuscitation attempt, I talked with his mother and we placed him in her arms.  She was given the option of an autopsy however she wanted some time to decide.  John GiovanniBenjamin Liborio Saccente, DO  Neonatologist

## 2018-02-20 NOTE — Anesthesia Postprocedure Evaluation (Signed)
Anesthesia Post Note  Patient: Laurie [name]  Procedure(s) Performed: AN AD HOC LABOR EPIDURAL     Patient location during evaluation: Mother Baby Anesthesia Type: Epidural Level of consciousness: awake Pain management: satisfactory to patient Vital Signs Assessment: post-procedure vital signs reviewed and stable Respiratory status: spontaneous breathing Cardiovascular status: stable Anesthetic complications: no    Last Vitals:  Vitals:   02/20/18 0916 02/20/18 1032  BP: (!) 150/89 138/86  Pulse: 73 75  Resp: 20 18  Temp: (!) 36.3 C 36.6 C  SpO2: 100% 100%    Last Pain:  Vitals:   02/20/18 1032  TempSrc: Oral  PainSc:    Pain Goal: Patients Stated Pain Goal: 0 (02/18/18 0044)               Cephus ShellingBURGER,Sanaia Jasso

## 2018-02-20 NOTE — Progress Notes (Signed)
At 0355, RN stated "if the heart rate on the FSE is correct, the heart rate is 58". MD acknowledged, removed FSE, and external u/s was reapplied.

## 2018-02-20 NOTE — Progress Notes (Signed)
[facility] notified of perinatal loss at approximately [id number]. Reference number [phone]#[id number].

## 2018-02-20 NOTE — Progress Notes (Signed)
Follow up visit with Laurie [name] and her "mother" who was holding baby Laurie [name].  Pt was seen by night chaplain after her delivery early this morning.  During our visit, [name] had obviously been crying, but was coping appropriately.  She has good family support and at this point feels like she'll be ready to go home tomorrow, but is planning to stay with relatives for a while.  She and her mother voiced that they'll continue to wonder why her baby died and I encouraged her to consult with her physician if she begins to blame herself or feels overwhelmed and confused by memories of her birth.  We discussed local resources for support including hospice and heartstrings and her "mother" encouraged her to reach out to them normalizing the benefit of the support a professional or another mother who has experienced loss can provide her.  I offered space for them to share the birth story and process feelings related to the baby's death.  Will continue to follow.  Please page as further needs arise.  Laurie [name], M.Div. [facility] Chaplain Pager [phone] Office [phone]

## 2018-02-21 DIAGNOSIS — N179 Acute kidney failure, unspecified: Secondary | ICD-10-CM | POA: Diagnosis not present

## 2018-02-21 LAB — COMPREHENSIVE METABOLIC PANEL
ALK PHOS: 171 U/L — AB (ref 38–126)
ALK PHOS: 145 U/L — AB (ref 38–126)
ALT: 35 U/L (ref 14–54)
ALT: 29 U/L (ref 14–54)
ANION GAP: 10 (ref 5–15)
ANION GAP: 9 (ref 5–15)
AST: 26 U/L (ref 15–41)
AST: 21 U/L (ref 15–41)
Albumin: 2 g/dL — ABNORMAL LOW (ref 3.5–5.0)
Albumin: 1.8 g/dL — ABNORMAL LOW (ref 3.5–5.0)
BILIRUBIN TOTAL: 0.3 mg/dL (ref 0.3–1.2)
BILIRUBIN TOTAL: 0.1 mg/dL — AB (ref 0.3–1.2)
BUN: 14 mg/dL (ref 6–20)
BUN: 16 mg/dL (ref 6–20)
CALCIUM: 7.2 mg/dL — AB (ref 8.9–10.3)
CALCIUM: 7.4 mg/dL — AB (ref 8.9–10.3)
CO2: 23 mmol/L (ref 22–32)
CO2: 23 mmol/L (ref 22–32)
Chloride: 100 mmol/L — ABNORMAL LOW (ref 101–111)
Chloride: 106 mmol/L (ref 101–111)
Creatinine, Ser: 1.17 mg/dL — ABNORMAL HIGH (ref 0.44–1.00)
Creatinine, Ser: 1.06 mg/dL — ABNORMAL HIGH (ref 0.44–1.00)
GFR calc Af Amer: >60 mL/min (ref >60)
GFR calc Af Amer: >60 mL/min (ref >60)
Glucose, Bld: 97 mg/dL (ref 65–99)
Glucose, Bld: 91 mg/dL (ref 65–99)
POTASSIUM: 3.3 mmol/L — AB (ref 3.5–5.1)
Potassium: 3.7 mmol/L (ref 3.5–5.1)
SODIUM: 138 mmol/L (ref 135–145)
Sodium: 133 mmol/L — ABNORMAL LOW (ref 135–145)
TOTAL PROTEIN: 5.8 g/dL — AB (ref 6.5–8.1)
TOTAL PROTEIN: 5 g/dL — AB (ref 6.5–8.1)

## 2018-02-21 LAB — CBC
HEMATOCRIT: 26.6 % — AB (ref 36.0–46.0)
Hemoglobin: 9.2 g/dL — ABNORMAL LOW (ref 12.0–15.0)
MCH: 31.7 pg (ref 26.0–34.0)
MCHC: 34.6 g/dL (ref 30.0–36.0)
MCV: 91.7 fL (ref 78.0–100.0)
Platelets: 321 10*3/uL (ref 150–400)
RBC: 2.9 MIL/uL — ABNORMAL LOW (ref 3.87–5.11)
RDW: 13.4 % (ref 11.5–15.5)
WBC: 28.9 10*3/uL — ABNORMAL HIGH (ref 4.0–10.5)

## 2018-02-21 MED ORDER — POTASSIUM CHLORIDE CRYS ER 20 MEQ PO TBCR
30.0000 meq | EXTENDED_RELEASE_TABLET | Freq: Two times a day (BID) | ORAL | Status: DC
  Administered 2018-02-21 (×2): 30 meq via ORAL
  Filled 2018-02-21 (×3): qty 1

## 2018-02-21 MED ORDER — SENNOSIDES-DOCUSATE SODIUM 8.6-50 MG PO TABS: 2.0000 | ORAL_TABLET | Freq: Every evening | ORAL | Status: DC | PRN

## 2018-02-21 MED ORDER — POLYETHYLENE GLYCOL 3350 17 G PO PACK
17.0000 g | PACK | Freq: Every day | ORAL | Status: DC
  Administered 2018-02-21: 17 g via ORAL
  Filled 2018-02-21: qty 1

## 2018-02-21 MED ORDER — IBUPROFEN 600 MG PO TABS
600.0000 mg | ORAL_TABLET | Freq: Four times a day (QID) | ORAL | Status: DC | PRN
  Administered 2018-02-21: 600 mg via ORAL
  Filled 2018-02-21: qty 1

## 2018-02-21 MED ORDER — FERROUS GLUCONATE 324 (38 FE) MG PO TABS
324.0000 mg | ORAL_TABLET | Freq: Two times a day (BID) | ORAL | Status: DC
  Administered 2018-02-21 (×2): 324 mg via ORAL
  Filled 2018-02-21 (×3): qty 1

## 2018-02-21 NOTE — Progress Notes (Signed)
Follow up visit with Laurie [name] and her cousin after the death of her son Laurie NottinghamJamison yesterday.  Offered continued support and space to process the loss of her baby as well as information on burial/cremation.  Pt is hopeful that she'll be discharged tomorrow. She expressed gratitude for the visit and is open to a follow up call next week.   Please page as further needs arise.  Laurie ShapeAmanda M. Carley Hammedavee Lomax, M.Div. [facility] Chaplain Pager [phone] Office [phone]

## 2018-02-21 NOTE — Progress Notes (Signed)
CSW received consult due to IUFD.  CSW spoke with Chaplain/A. Davee-Lomax before becoming involved.  Chaplain states no indication for CSW involvement at this time, but will contact CSW as needed.  CSW available at patient's request.  CSW screening out referral at this time.

## 2018-02-21 NOTE — Progress Notes (Addendum)
Daily Antepartum Note  Admission Date: 02/18/2018 Current Date: 02/21/2018 8:17 AM  Laurie [name] is a 27 y.o. G1P1000, PPD#1/2nd with right mediolateral , admitted for IOL for atypical HELLP. Patient with  Pregnancy complicated by: Patient Active Problem List   Diagnosis Date Noted  . AKI (acute kidney injury) (HCC) 02/21/2018  . HELLP (hemolytic anemia/elev liver enzymes/low platelets in pregnancy) 02/18/2018  . Trichomonal vaginitis during pregnancy, antepartum 09/14/2017  . Tobacco use affecting pregnancy, antepartum 09/14/2017  . Rubella non-immune status, antepartum 08/11/2017  . Supervision of other normal pregnancy, antepartum 08/09/2017  . Tenosynovitis 07/03/2015    Overnight/24hr events:  Pt came off Mg this morning  Subjective:  No s/s of infection or pre-eclampsia.  Objective:    Current Vital Signs 24h Vital Sign Ranges  T 97.7 F (36.5 C) Temp  Avg: 97.6 F (36.4 C)  Min: 97.4 F (36.3 C)  Max: 97.9 F (36.6 C)  BP (!) 145/90 BP  Min: 131/81  Max: 150/89  HR 66 Pulse  Avg: 72.3  Min: 66  Max: 77  RR 18 Resp  Avg: 17.4  Min: 16  Max: 20  SaO2 99 % Room Air SpO2  Avg: 99.7 %  Min: 99 %  Max: 100 %       24 Hour I/O Current Shift I/O  Time Ins Outs 06/17 0701 - 06/18 0700 In: 2924 [P.O.:1134; I.V.:1790] Out: 2750 [Urine:2750] No intake/output data recorded.   Patient Vitals for the past 24 hrs:  BP Temp Temp src Pulse Resp SpO2  02/21/18 0400 (!) 145/90 97.7 F (36.5 C) Oral 66 18 99 %  02/21/18 0300 - - - - 18 -  02/21/18 0200 - - - - 16 -  02/21/18 0100 - - - - 16 -  02/21/18 0000 131/81 97.8 F (36.6 C) Oral 73 18 100 %  02/20/18 2300 - - - - 16 -  02/20/18 2200 - - - - 18 -  02/20/18 2100 - - - - 18 -  02/20/18 2000 (!) 140/91 (!) 97.5 F (36.4 C) Oral 72 16 -  02/20/18 1627 (!) 143/98 - - 70 - -  02/20/18 1606 (!) 144/104 (!) 97.5 F (36.4 C) Oral 72 16 100 %  02/20/18 1226 (!) 144/92 97.7 F (36.5 C) Oral 77 18 99 %  02/20/18 1032  138/86 97.9 F (36.6 C) Oral 75 18 100 %  02/20/18 0916 (!) 150/89 (!) 97.4 F (36.3 C) Oral 73 20 100 %    Physical exam: General: Well nourished, well developed female in no acute distress. Abdomen: nttp Extremities: no clubbing, cyanosis or edema Skin: Warm and dry.   Medications: Current Facility-Administered Medications  Medication Dose Route Frequency Provider Last Rate Last Dose  . acetaminophen (TYLENOL) tablet 650 mg  650 mg Oral Q4H PRN Degele, Kandra Nicolas, MD      . benzocaine-Menthol (DERMOPLAST) 20-0.5 % topical spray 1 application  1 application Topical PRN Degele, Kandra Nicolas, MD      . coconut oil  1 application Topical PRN Degele, Kandra Nicolas, MD      . cyclobenzaprine (FLEXERIL) tablet 10 mg  10 mg Oral TID PRN Anyanwu, Jethro Bastos, MD   10 mg at 02/20/18 1017  . witch hazel-glycerin (TUCKS) pad 1 application  1 application Topical PRN Degele, Kandra Nicolas, MD       And  . dibucaine (NUPERCAINAL) 1 % rectal ointment 1 application  1 application Rectal PRN Degele, Kandra Nicolas, MD      .  diphenhydrAMINE (BENADRYL) capsule 25 mg  25 mg Oral Q6H PRN Degele, Kandra NicolasJulie P, MD      . ferrous gluconate (FERGON) tablet 324 mg  324 mg Oral BID WC [place] BingPickens, Alin Chavira, MD      . hydrALAZINE (APRESOLINE) injection 10 mg  10 mg Intravenous Once PRN Degele, Kandra NicolasJulie P, MD      . ibuprofen (ADVIL,MOTRIN) tablet 600 mg  600 mg Oral Q6H PRN [place] BingPickens, Kemoni Ortega, MD      . labetalol (NORMODYNE,TRANDATE) injection 20-80 mg  20-80 mg Intravenous Q10 min PRN Degele, Kandra NicolasJulie P, MD      . nicotine (NICODERM CQ - dosed in mg/24 hours) patch 21 mg  21 mg Transdermal Daily Anyanwu, Ugonna A, MD   21 mg at 02/20/18 1049  . ondansetron (ZOFRAN) tablet 4 mg  4 mg Oral Q4H PRN Degele, Kandra NicolasJulie P, MD       Or  . ondansetron (ZOFRAN) injection 4 mg  4 mg Intravenous Q4H PRN Degele, Kandra NicolasJulie P, MD      . polyethylene glycol (MIRALAX / GLYCOLAX) packet 17 g  17 g Oral Daily [place] BingPickens, Ademola Vert, MD      . potassium chloride (K-DUR,KLOR-CON) CR tablet  30 mEq  30 mEq Oral BID AC [place] BingPickens, Kaislee Chao, MD      . prenatal multivitamin tablet 1 tablet  1 tablet Oral Q1200 Degele, Kandra NicolasJulie P, MD      . senna-docusate (Senokot-S) tablet 2 tablet  2 tablet Oral QHS PRN [place] BingPickens, Jolean Madariaga, MD      . simethicone (MYLICON) chewable tablet 80 mg  80 mg Oral PRN Degele, Kandra NicolasJulie P, MD      . Tdap (BOOSTRIX) injection 0.5 mL  0.5 mL Intramuscular Once Degele, Kandra NicolasJulie P, MD      . zolpidem (AMBIEN) tablet 5 mg  5 mg Oral QHS PRN Degele, Kandra NicolasJulie P, MD        Labs:  Recent Labs  Lab 02/20/18 0104 02/20/18 0739 02/21/18 0546  WBC 26.1* 34.3* 28.9*  HGB 10.4* 10.2* 9.2*  HCT 31.2* 29.3* 26.6*  PLT 282 308 321    Recent Labs  Lab 02/18/18 1744 02/20/18 0104 02/21/18 0546  NA 133* 127* 133*  K 4.2 3.9 3.3*  CL 103 98* 100*  CO2 21* 18* 23  BUN 11 6 14   CREATININE 0.86 0.99 1.17*  CALCIUM 7.7* 6.8* 7.2*  PROT 5.9* 5.8* 5.8*  BILITOT 0.1* 0.5 0.3  ALKPHOS 219* 223* 171*  ALT 77* 57* 35  AST 55* 41 26  GLUCOSE 141* 153* 97     Radiology: no new imaging  Assessment & Plan:  New AKI, hypokalemia. Pt stable *PP: routine care. 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 Continue to follow. Pt with good UOP while on Mg. Will hold off on any BP meds for now *PPx: SCDs, OOB ad lib *FEN/GI:saline lock IV. kdur ordered.  *Dispo: likely tomorrow  Cornelia Copaharlie Stephens Shreve, Jr. MD Attending Center for [facility](Faculty Practice)

## 2018-02-21 NOTE — Progress Notes (Signed)
Lactation visit attempted to discuss how to handle her milk coming to volume. Patient requested a visit at a later time.   Glenetta HewKim Idara Woodside, RN, IBCLC

## 2018-02-22 LAB — COMPREHENSIVE METABOLIC PANEL
ALK PHOS: 145 U/L — AB (ref 38–126)
ALT: 24 U/L (ref 14–54)
AST: 18 U/L (ref 15–41)
Albumin: 1.9 g/dL — ABNORMAL LOW (ref 3.5–5.0)
Anion gap: 8 (ref 5–15)
BUN: 22 mg/dL — AB (ref 6–20)
CALCIUM: 7.8 mg/dL — AB (ref 8.9–10.3)
CHLORIDE: 110 mmol/L (ref 101–111)
CO2: 21 mmol/L — AB (ref 22–32)
CREATININE: 1.04 mg/dL — AB (ref 0.44–1.00)
GFR calc non Af Amer: >60 mL/min (ref >60)
Glucose, Bld: 93 mg/dL (ref 65–99)
Potassium: 4.6 mmol/L (ref 3.5–5.1)
SODIUM: 139 mmol/L (ref 135–145)
Total Bilirubin: 0.2 mg/dL — ABNORMAL LOW (ref 0.3–1.2)
Total Protein: 5.2 g/dL — ABNORMAL LOW (ref 6.5–8.1)

## 2018-02-22 MED ORDER — BENZOCAINE-MENTHOL 20-0.5 % EX AERO: 1.0000 "application " | INHALATION_SPRAY | CUTANEOUS | 1 refills | Status: AC | PRN

## 2018-02-22 MED ORDER — POLYETHYLENE GLYCOL 3350 17 G PO PACK: 17.0000 g | PACK | Freq: Every day | ORAL | 0 refills | Status: AC | PRN

## 2018-02-22 MED ORDER — ZOLPIDEM TARTRATE 5 MG PO TABS: 5.0000 mg | ORAL_TABLET | Freq: Every evening | ORAL | 0 refills | Status: AC | PRN

## 2018-02-22 MED ORDER — ACETAMINOPHEN 500 MG PO TABS: 1000.0000 mg | ORAL_TABLET | Freq: Four times a day (QID) | ORAL | 0 refills | Status: AC | PRN

## 2018-02-22 MED ORDER — OXYCODONE HCL 5 MG PO TABS: 5.0000 mg | ORAL_TABLET | Freq: Four times a day (QID) | ORAL | 0 refills | Status: AC | PRN

## 2018-02-22 MED ORDER — FERROUS GLUCONATE 324 (38 FE) MG PO TABS: 324.0000 mg | ORAL_TABLET | Freq: Two times a day (BID) | ORAL | 0 refills | Status: AC

## 2018-02-22 NOTE — Discharge Instructions (Addendum)
Hypertension During Pregnancy Hypertension is also called high blood pressure. High blood pressure means that the force of your blood moving in your body is too strong. When you are pregnant, this condition should be watched carefully. It can cause problems for you and your baby. Follow these instructions at home:  Avoid NSAIDs like motrin, naproxen, aleve.    You have symptoms that your doctor told you to watch for, such as: ? Fever. ? Throwing up (vomiting). ? Headache. Get help right away if:  You have very bad pain in your belly (abdomen).  You are throwing up, and this does not get better with treatment.  You suddenly get swelling in your hands, ankles, or face.  You gain 4 lb (1.8 kg) or more in 1 week.  You get bleeding from your vagina.  You have blood in your pee.  You do not feel your baby moving as much as normal.  You have a change in vision.  You have muscle twitching or sudden tightening (spasms).  You have trouble breathing.  Your lips or fingernails turn blue. This information is not intended to replace advice given to you by your health care provider. Make sure you discuss any questions you have with your health care provider. Document Released: 09/25/2010 Document Revised: 05/04/2016 Document Reviewed: 05/04/2016 Elsevier Interactive Patient Education  2018 Elsevier Inc.  Vaginal Delivery, Care After Refer to this sheet in the next few weeks. These discharge instructions provide you with information on caring for yourself after delivery. Your caregiver may also give you specific instructions. Your treatment has been planned according to the most current medical practices available, but problems sometimes occur. Call your caregiver if you have any problems or questions after you go home. HOME CARE INSTRUCTIONS 1. Take over-the-counter or prescription medicines only as directed by your caregiver or pharmacist. 2. Do not drink alcohol, especially if you are  breastfeeding or taking medicine to relieve pain. 3. Do not smoke tobacco. 4. Continue to use good perineal care. Good perineal care includes: 1. Wiping your perineum from back to front 2. Keeping your perineum clean. 3. You can do sitz baths twice a day, to help keep this area clean 5. Do not use tampons, douche or have sex until your caregiver says it is okay. 6. Shower only and avoid sitting in submerged water, aside from sitz baths 7. Wear a well-fitting bra that provides breast support. 8. Eat healthy foods. 9. Drink enough fluids to keep your urine clear or pale yellow. 10. Eat high-fiber foods such as whole grain cereals and breads, brown rice, beans, and fresh fruits and vegetables every day. These foods may help prevent or relieve constipation. 11. Avoid constipation with high fiber foods or medications, such as miralax or metamucil 12. Follow your caregiver's recommendations regarding resumption of activities such as climbing stairs, driving, lifting, exercising, or traveling. 13. Talk to your caregiver about resuming sexual activities. Resumption of sexual activities is dependent upon your risk of infection, your rate of healing, and your comfort and desire to resume sexual activity. 14. Try to have someone help you with your household activities and your newborn for at least a few days after you leave the hospital. 15. Rest as much as possible. Try to rest or take a nap when your newborn is sleeping. 16. Increase your activities gradually. 17. Keep all of your scheduled postpartum appointments. It is very important to keep your scheduled follow-up appointments. At these appointments, your caregiver will be checking to  make sure that you are healing physically and emotionally. SEEK MEDICAL CARE IF:   You are passing large clots from your vagina. Save any clots to show your caregiver.  You have a foul smelling discharge from your vagina.  You have trouble urinating.  You are  urinating frequently.  You have pain when you urinate.  You have a change in your bowel movements.  You have increasing redness, pain, or swelling near your vaginal incision (episiotomy) or vaginal tear.  You have pus draining from your episiotomy or vaginal tear.  Your episiotomy or vaginal tear is separating.  You have painful, hard, or reddened breasts.  You have a severe headache.  You have blurred vision or see spots.  You feel sad or depressed.  You have thoughts of hurting yourself or your newborn.  You have questions about your care, the care of your newborn, or medicines.  You are dizzy or light-headed.  You have a rash.  You have nausea or vomiting.  You were breastfeeding and have not had a menstrual period within 12 weeks after you stopped breastfeeding.  You are not breastfeeding and have not had a menstrual period by the 12th week after delivery.  You have a fever. SEEK IMMEDIATE MEDICAL CARE IF:   You have persistent pain.  You have chest pain.  You have shortness of breath.  You faint.  You have leg pain.  You have stomach pain.  Your vaginal bleeding saturates two or more sanitary pads in 1 hour. MAKE SURE YOU:   Understand these instructions.  Will watch your condition.  Will get help right away if you are not doing well or get worse. Document Released: 08/20/2000 Document Revised: 01/07/2014 Document Reviewed: 04/19/2012 [facility] Patient Information 2015 [place], [place]. This information is not intended to replace advice given to you by your health care provider. Make sure you discuss any questions you have with your health care provider.  Sitz Bath A sitz bath is a warm water bath taken in the sitting position. The water covers only the hips and butt (buttocks). We recommend using one that fits in the toilet, to help with ease of use and cleanliness. It may be used for either healing or cleaning purposes. Sitz baths are also used to  relieve pain, itching, or muscle tightening (spasms). The water may contain medicine. Moist heat will help you heal and relax.  HOME CARE  Take 3 to 4 sitz baths a day. 18. Fill the bathtub half-full with warm water. 19. Sit in the water and open the drain a little. 20. Turn on the warm water to keep the tub half-full. Keep the water running constantly. 21. Soak in the water for 15 to 20 minutes. 22. After the sitz bath, pat the affected area dry. GET HELP RIGHT AWAY IF: You get worse instead of better. Stop the sitz baths if you get worse. MAKE SURE YOU:  Understand these instructions.  Will watch your condition.  Will get help right away if you are not doing well or get worse. Document Released: 09/30/2004 Document Revised: 05/17/2012 Document Reviewed: 12/21/2010 [facility] Patient Information 2015 [place], [place]. This information is not intended to replace advice given to you by your health care provider. Make sure you discuss any questions you have with your health care provider.

## 2018-02-22 NOTE — Progress Notes (Signed)
Pt given discharge instructions, PP care, Grief counseling discussed and Heartstrings pamphlet given. Pre-E s/s reviewed, medications discussed and PP follow-up reviewed. Pt discharged ambulatory per pt request.

## 2018-02-22 NOTE — Discharge Summary (Signed)
Obstetrical Discharge Summary  Date of Admission: 02/18/2018 Date of Discharge: 02/22/2018  Primary OB: [facility]  Gestational Age at Delivery: [id number]   Antepartum complications: insufficient prenatal care, tobacco abuse, trichomonas with negative test of cure Reason for Admission: IOL for atypical HELLP Date of Delivery: 02/20/2018  Delivered By: Laurie Copaharlie Daniil Labarge Jr MD Delivery Type: vacuum, low Intrapartum complications/course: chorioamnionitis, atypical HELLP  Anesthesia: epidural Placenta: Delivered and expressed via active management. Intact: yes. To pathology: yes.  Laceration: 2nd degree Episiotomy: right mediolateral without extension EBL: 300mL Baby: stillborn female, APGARs 0/0, weight 3305 g.    Discharge Diagnosis: Delivered.  Postpartum course: Uncomplicated. Patient received one dose of ampicillin and gentamycin postpartum and 24 hours of post partum magnesium. She had resolving AKI on the day of discharge.   Discharge Vital Signs:  Current Vital Signs 24h Vital Sign Ranges  T 98.5 F (36.9 C) Temp  Avg: 98.1 F (36.7 C)  Min: 97.8 F (36.6 C)  Max: 98.5 F (36.9 C)  BP (!) 110/53 BP  Min: 110/53  Max: 149/84  HR 91 Pulse  Avg: 71.7  Min: 60  Max: 91  RR 16 Resp  Avg: 17.4  Min: 16  Max: 18  SaO2 97 % Room Air SpO2  Avg: 99 %  Min: 97 %  Max: 100 %       24 Hour I/O Current Shift I/O  Time Ins Outs 06/18 0701 - 06/19 0700 In: 444 [P.O.:444] Out: 325 [Urine:325] No intake/output data recorded.   Discharge Exam:  NAD Perineum: deferred Abdomen: firm fundus below the umbilicus, NTTP, non distended, +bowel sounds.  RRR no MRGs CTAB  Recent Labs  Lab 02/21/18 0546  WBC 28.9*  HGB 9.2*  HCT 26.6*  PLT 321   CMP Latest Ref Rng & Units 02/22/2018 02/21/2018 02/21/2018  Glucose 65 - 99 mg/dL 93 91 97  BUN 6 - 20 mg/dL 95(M22(H) 16 14  Creatinine 0.44 - 1.00 mg/dL 8.41(L1.04(H) 2.44(W1.06(H) 1.02(V1.17(H)  Sodium 135 - 145 mmol/L 139 138 133(L)   Potassium 3.5 - 5.1 mmol/L 4.6 3.7 3.3(L)  Chloride 101 - 111 mmol/L 110 106 100(L)  CO2 22 - 32 mmol/L 21(L) 23 23  Calcium 8.9 - 10.3 mg/dL 7.8(L) 7.4(L) 7.2(L)  Total Protein 6.5 - 8.1 g/dL 5.2(L) 5.0(L) 5.8(L)  Total Bilirubin 0.3 - 1.2 mg/dL 2.5(D0.2(L) 6.6(Y0.1(L) 0.3  Alkaline Phos 38 - 126 U/L 145(H) 145(H) 171(H)  AST 15 - 41 U/L 18 21 26   ALT 14 - 54 U/L 24 29 35    Disposition: Home  Rh Immune globulin given: not applicable  Contraception: not discussed with patient  Plan:  Laurie [name] was discharged to home in good condition. Follow-up appointment with Dr. Vergie [name] in 1 week for a PP visit  Future Appointments  Date Time Provider Department Center  03/02/2018 11:00 AM [place] Laurie [name], Laurie Hardebeck, MD [facility]    Discharge Medications: Allergies as of 02/22/2018   No Known Allergies     Medication List    TAKE these medications   acetaminophen 500 MG tablet Commonly known as:  TYLENOL Take 2 tablets (1,000 mg total) by mouth every 6 (six) hours as needed for mild pain or headache (for pain scale < 4).   albuterol 108 (90 Base) MCG/ACT inhaler Commonly known as:  PROVENTIL HFA;VENTOLIN HFA Inhale 2 puffs into the lungs every 6 (six) hours as needed for wheezing or shortness of breath.   benzocaine-Menthol 20-0.5 % Aero Commonly known  asLorelle [name] Apply 1 application topically as needed for irritation (perineal discomfort).   ferrous gluconate 324 MG tablet Commonly known as:  FERGON Take 1 tablet (324 mg total) by mouth 2 (two) times daily with a meal. What changed:  when to take this   oxyCODONE 5 MG immediate release tablet Commonly known as:  ROXICODONE Take 1 tablet (5 mg total) by mouth every 6 (six) hours as needed for severe pain.   polyethylene glycol packet Commonly known as:  MIRALAX / GLYCOLAX Take 17 g by mouth daily as needed for mild constipation.   prenatal multivitamin Tabs tablet Take 1 tablet by mouth daily at 12 noon.   zolpidem  5 MG tablet Commonly known as:  AMBIEN Take 1 tablet (5 mg total) by mouth at bedtime as needed for sleep.       Laurie Copa MD Attending Center for [facility])

## 2018-02-28 ENCOUNTER — Encounter: Payer: Self-pay | Admitting: *Deleted

## 2018-03-02 ENCOUNTER — Ambulatory Visit: Payer: Self-pay | Admitting: Obstetrics and Gynecology

## 2018-03-02 ENCOUNTER — Telehealth: Payer: Self-pay | Admitting: Obstetrics and Gynecology

## 2018-03-02 NOTE — Telephone Encounter (Signed)
OB Telephone Note Patient called [phone] and she had to miss her appt today to pick up the remains. Patient states she has a good support team and is doing okay. I'm in the office next Friday and pt is amenable for a check up visit then. In basket [facility].  Laurie [name], Jr MD Attending Center for [facility] (Faculty Practice) 03/02/2018 Time: [phone]

## 2018-03-03 ENCOUNTER — Telehealth: Payer: Self-pay | Admitting: Radiology

## 2018-03-03 ENCOUNTER — Encounter (INDEPENDENT_AMBULATORY_CARE_PROVIDER_SITE_OTHER): Payer: Self-pay

## 2018-03-03 NOTE — Telephone Encounter (Signed)
Left message on the cell phone voicemail, notifying patient of rescheduled date for postpartum appointment on 03/10/18 @ 11:15 with Dr Vergie LivingPickens. Also, messaged patient in Mychart.

## 2018-03-10 ENCOUNTER — Ambulatory Visit: Payer: Self-pay | Admitting: Obstetrics and Gynecology

## 2018-03-10 ENCOUNTER — Telehealth: Payer: Self-pay | Admitting: Radiology

## 2018-03-10 NOTE — Telephone Encounter (Signed)
patient called and stated that she needed to reschedule, she was unable to make appointment today, rescheduled for 03/15/18 @ 10:15 with Dr Vergie LivingPickens.

## 2018-03-15 ENCOUNTER — Ambulatory Visit: Payer: Self-pay | Admitting: Obstetrics and Gynecology

## 2018-03-21 ENCOUNTER — Ambulatory Visit: Payer: Self-pay | Admitting: Obstetrics and Gynecology

## 2018-04-06 ENCOUNTER — Ambulatory Visit: Payer: Self-pay | Admitting: Obstetrics and Gynecology

## 2018-07-05 ENCOUNTER — Ambulatory Visit: Payer: Self-pay | Admitting: Advanced Practice Midwife

## 2018-07-05 DIAGNOSIS — Z308 Encounter for other contraceptive management: Secondary | ICD-10-CM

## 2018-07-05 NOTE — Progress Notes (Deleted)
Patient no-showed for appointment today.  Clayton Bibles, CNM 07/05/18 9:25 AM

## 2018-09-18 ENCOUNTER — Encounter: Payer: BLUE CROSS/BLUE SHIELD | Attending: Nurse Practitioner | Primary: Family

## 2018-09-19 ENCOUNTER — Encounter: Attending: Nurse Practitioner | Primary: Family

## 2018-10-03 ENCOUNTER — Encounter

## 2018-10-03 ENCOUNTER — Encounter: Payer: BLUE CROSS/BLUE SHIELD | Attending: Obstetrics & Gynecology | Primary: Family

## 2018-10-03 ENCOUNTER — Other Ambulatory Visit
Admit: 2018-10-03 | Discharge: 2018-10-03 | Payer: BLUE CROSS/BLUE SHIELD | Attending: Obstetrics & Gynecology | Primary: Family

## 2018-10-03 DIAGNOSIS — Z3A11 11 weeks gestation of pregnancy: Secondary | ICD-10-CM

## 2018-10-03 LAB — COMPREHENSIVE METABOLIC PANEL
ALT: 11 U/L (ref 5–33)
AST: 12 U/L (ref 5–32)
Albumin: 4.2 g/dL (ref 3.5–5.2)
Alkaline Phosphatase: 48 U/L (ref 35–104)
Anion Gap: 11 mmol/L (ref 7–19)
BUN: 12 mg/dL (ref 6–20)
CO2: 24 mmol/L (ref 22–29)
Calcium: 9.3 mg/dL (ref 8.6–10.0)
Chloride: 102 mmol/L (ref 98–111)
Creatinine: 0.5 mg/dL (ref 0.5–0.9)
GFR Non-African American: >60 (ref >60)
Glucose: 85 mg/dL (ref 74–109)
Potassium: 4.1 mmol/L (ref 3.5–5.0)
Sodium: 137 mmol/L (ref 136–145)
Total Bilirubin: <0.2 mg/dL (ref 0.2–1.2)
Total Protein: 7.5 g/dL (ref 6.6–8.7)

## 2018-10-03 LAB — TYPE AND SCREEN
ABO/Rh: O POS
Antibody Screen: NEGATIVE

## 2018-10-03 LAB — HCG, QUANTITATIVE, PREGNANCY: hCG Quant: 20455 m[IU]/mL — ABNORMAL HIGH (ref 0.0–5.3)

## 2018-10-03 LAB — PROGESTERONE: Progesterone: 20.25 ng/mL

## 2018-10-03 NOTE — Progress Notes (Signed)
 CNM Prenatal Office Note  Subjective:  Janet Bentley is here for a return obstetrical visit. Today she is [redacted]w[redacted]d weeks EGA. She is doing well and has no complaints.  She  does not have vaginal bleeding, n/v, dizziness, or cramping.   Objective:  Mother's Prenatal Vitals  BP: 117/73  Weight: 160 lb (72.6 kg)  Pulse: 64  Patient Position: Sitting  Prenatal Fetal Information  Movement: Absent  Pt is A&Ox3, in no acute distress. Normocephalic, atraumatic. PERRL. Resp even and non-labored. Skin pink, warm & dry. Gravid abdomen. MAE's well. Gait steady.   Assessment:    IUP at [redacted]w[redacted]d wks      Diagnosis Orders   1. [redacted] weeks gestation of pregnancy  Antibody Titer    C.trachomatis N.gonorrhoeae DNA    HCG, Quantitative, Pregnancy    Herpes simplex virus (HSV) I/II antibodies IgG & IgM w/ reflex    HIV Obstetric Panel    Progesterone    Urine Culture    Varicella Zoster Antibody, IgG    Creatinine, Random Urine    Protein, Urine, Random    Comprehensive Metabolic Panel   2. History of pre-eclampsia in prior pregnancy, currently pregnant in first trimester  Creatinine, Random Urine    Protein, Urine, Random    Comprehensive Metabolic Panel   3. Confirm fetal cardiac activity using ultrasound  US  OB Transvaginal     Plan:  . Pt counseled on counseled need for ASA 81 mg and Preterm labor  . Continue with routine prenatal care.  Aaron Aas RTC in 1 wks for prenatal visit, viability US , new OB labs, preeclampsia labs    MEDICATIONS:  No orders of the defined types were placed in this encounter.    ORDERS:  Orders Placed This Encounter   Procedures   . C.trachomatis N.gonorrhoeae DNA   . Urine Culture   . US  OB Transvaginal   . HCG, Quantitative, Pregnancy   . Herpes simplex virus (HSV) I/II antibodies IgG & IgM w/ reflex   . HIV Obstetric Panel   . Progesterone   . Varicella Zoster Antibody, IgG   . Creatinine, Random Urine   . Protein, Urine, Random   . Comprehensive Metabolic Panel   . Antibody Titer       More than 50% of this 20 min  visit was education and counseling.

## 2018-10-03 NOTE — Progress Notes (Signed)
Pt is here for prenatal appointment.  She denies spotting, cramping, contractions. PT is new to this area and is not sure about a doctor, but she is requesting a csection.

## 2018-10-05 LAB — HIV OBSTETRIC PANEL
Basophils %: 0.6 % (ref 0.0–1.0)
Basophils Absolute: 0.1 10*3/uL (ref 0.00–0.20)
Eosinophils %: 1.6 % (ref 0.0–5.0)
Eosinophils Absolute: 0.2 10*3/uL (ref 0.00–0.60)
HIV-1 P24 Ag: NONREACTIVE
Hematocrit: 37.7 % (ref 37.0–47.0)
Hemoglobin: 12.4 g/dL (ref 12.0–16.0)
Hep B S Ag Interp: NONREACTIVE
Immature Granulocytes #: 0 10*3/uL
Lymphocytes %: 17.4 % — ABNORMAL LOW (ref 20.0–40.0)
Lymphocytes Absolute: 1.7 10*3/uL (ref 1.1–4.5)
MCH: 30.5 pg (ref 27.0–31.0)
MCHC: 32.9 g/dL — ABNORMAL LOW (ref 33.0–37.0)
MCV: 92.6 fL (ref 81.0–99.0)
MPV: 10.9 fL (ref 9.4–12.3)
Monocytes %: 5.3 % (ref 0.0–10.0)
Monocytes Absolute: 0.5 10*3/uL (ref 0.00–0.90)
Neutrophils %: 74.8 % — ABNORMAL HIGH (ref 50.0–65.0)
Neutrophils Absolute: 7.1 10*3/uL (ref 1.5–7.5)
Platelets: 292 10*3/uL (ref 130–400)
RBC: 4.07 M/uL — ABNORMAL LOW (ref 4.20–5.40)
RDW: 14.6 % — ABNORMAL HIGH (ref 11.5–14.5)
RPR: NONREACTIVE
Rapid HIV 1&2: NONREACTIVE
Rubella Antibody IgG: REACTIVE
WBC: 9.5 10*3/uL (ref 4.8–10.8)

## 2018-10-05 LAB — CULTURE, URINE: Urine Culture, Routine: NO GROWTH

## 2018-10-06 LAB — C.TRACHOMATIS N.GONORRHOEAE DNA
C. Trachomatis Amplified: NEGATIVE
N. Gonorrhoeae Amplified: NEGATIVE

## 2018-10-09 LAB — VARICELLA ZOSTER ANTIBODY, IGG: Varicella Zoster Ab IgG: 1.22 (ref >1.09)

## 2018-10-13 ENCOUNTER — Encounter

## 2018-10-13 ENCOUNTER — Inpatient Hospital Stay: Admit: 2018-10-13 | Payer: BLUE CROSS/BLUE SHIELD | Primary: Family

## 2018-10-13 ENCOUNTER — Ambulatory Visit: Admit: 2018-10-13 | Discharge: 2018-10-13 | Payer: BLUE CROSS/BLUE SHIELD | Attending: Family | Primary: Family

## 2018-10-13 DIAGNOSIS — O09292 Supervision of pregnancy with other poor reproductive or obstetric history, second trimester: Secondary | ICD-10-CM

## 2018-10-13 DIAGNOSIS — Z3689 Encounter for other specified antenatal screening: Secondary | ICD-10-CM

## 2018-10-13 NOTE — Progress Notes (Signed)
 Patient presents today for routine prenatal visit.  Pt denies any vaginal leaking bleeding or contractions.  S:Janet Bentley is here for a return obstetrical visit. Today she is [redacted]w[redacted]d weeks EGA. She is doing well and has no complaints.  Will check hemoglobinopathy today as declines chromosomal testing. Found sex of baby on today's US , results discussed and edd set. Will schedule anatomy scan. Pt is on baby asa. Would like samples of pnv. She  does not have vaginal bleeding, leaking of fluid, contractions. She does not have blurred vision, SOB, or increased swelling in legs or face. Pt does not feel fetal movement regularly.        O:   Vitals:    10/13/18 1335   BP: (!) 117/56   Pulse: 80   Weight: 160 lb (72.6 kg)     Pt is A&Ox3, in no acute distress. Normocephalic, atraumatic. PERRL. Resp even and non-labored. Skin pink, warm & dry. Gravid abdomen. MAE's well. Gait steady.   See OB flowsheet.    A: Normal IUP at [redacted]w[redacted]d wks      Diagnosis Orders   1. History of pre-eclampsia in prior pregnancy, currently pregnant in second trimester  Hemoglobinopathy Eval (Electrophoresis)    External Referral To Maternal Fetal Medicine   2. [redacted] weeks gestation of pregnancy  Hemoglobinopathy Eval (Electrophoresis)       P:   Pt counseled on importance of baby asa to prevent pre-eclampsia and Genetic testing  Continue with routine prenatal care.  RTC in 4 wk for prenatal visit    MEDICATIONS:  No orders of the defined types were placed in this encounter.      ORDERS:  Orders Placed This Encounter   Procedures   . Hemoglobinopathy Eval (Electrophoresis)   . External Referral To Maternal Fetal Medicine

## 2018-10-13 NOTE — Patient Instructions (Signed)
Patient Education        Weeks 10 to 14 of Your Pregnancy: Care Instructions  Your Care Instructions    By weeks 10 to 14 of your pregnancy, the placenta has formed inside your uterus. It is possible to hear your baby's heartbeat with a special ultrasound device. Your baby's eyes can and do move. The arms and legs can bend.  This is a good time to think about testing for birth defects. There are two types of tests: screening and diagnostic. Screening tests show the chance that a baby has a certain birth defect. They can't tell you for sure that your baby has a problem. Diagnostic tests show if a baby has a certain birth defect.  It's your choice whether to have these tests. You and your partner can talk to your doctor or midwife about birth defects tests.  Follow-up care is a key part of your treatment and safety. Be sure to make and go to all appointments, and call your doctor if you are having problems. It's also a good idea to know your test results and keep a list of the medicines you take.  How can you care for yourself at home?  Decide about tests   You can have screening tests and diagnostic tests to check for birth defects. The decision to have a test for birth defects is personal. Think about your age, your chance of passing on a family disease, your need to know about any problems, and what you might do after you have the test results.  ? Triple or quadruple (quad) blood tests. These screening tests can be done between 15 and 20 weeks of pregnancy. They check the amounts of three or four substances in your blood. The doctor looks at these test results, along with your age and other factors, to find out the chance that your baby may have certain problems.  ? Amniocentesis. This diagnostic test is used to look for chromosomal problems in the baby's cells. It can be done between 15 and 20 weeks of pregnancy, usually around week 16.  ? Nuchal translucency test. This test uses ultrasound to measure the  thickness of the area at the back of the baby's neck. An increase in the thickness can be an early sign of Down syndrome.  ? Chorionic villus sampling (CVS). This is a test that looks for certain genetic problems with your baby. The same genes that are in your baby are in the placenta. A small piece of the placenta is taken out and tested. This test is done when you are 10 to [redacted] weeks pregnant.  Ease discomfort   Slow down and take naps when you feel tired.   If your emotions swing, talk to someone. Crying, anxiety, and concentration problems are common.   If your gums bleed, try a softer toothbrush. If your gums are puffy and bleed a lot, see your dentist.   If you feel dizzy:  ? Get up slowly after sitting or lying down.  ? Drink plenty of fluids.  ? Eat small snacks to keep your blood sugar stable.  ? Put your head between your legs as though you were tying your shoelaces.  ? Lie down with your legs higher than your head. Use pillows to prop up your feet.   If you have a headache:  ? Lie down.  ? Ask your partner or a good friend for a neck massage.  ? Try cool cloths over your forehead or across  the back of your neck.  ? Use acetaminophen (Tylenol) for pain relief. Do not use nonsteroidal anti-inflammatory drugs (NSAIDs), such as ibuprofen (Advil, Motrin) or naproxen (Aleve), unless your doctor says it is okay.   If you have a nosebleed, pinch your nose gently, and hold it for a short while. To prevent nosebleeds, try massaging a small dab of petroleum jelly, such as Vaseline, in your nostrils.   If your nose is stuffed up, try saline (saltwater) nose sprays. Do not use decongestant sprays.  Care for your breasts   Wear a bra that gives you good support.   Know that changes in your breasts are normal.  ? Your breasts may get larger and more tender. Tenderness usually gets better by 12 weeks.  ? Your nipples may get darker and larger, and small bumps around your nipples may show more.  ? The veins in  your chest and breasts may show more.   Don't worry about "toughening'" your nipples. Breastfeeding will naturally do this.  Where can you learn more?  Go to [email or web address] and sign in to your MyChart account. Enter [id number] in the Search Health Information box to learn more about "Weeks 10 to 14 of Your Pregnancy: Care Instructions."     If you do not have an account, please click on the "Sign Up Now" link.  Current as of: Feb 01, 2018  Content Version: 12.3   2006-2019 [employer]. Care instructions adapted under license by [facility]. If you have questions about a medical condition or this instruction, always ask your healthcare professional. [employer], Inc[employer] disclaims any warranty or liability for your use of this information.

## 2018-10-15 LAB — HERPES SIMPLEX VIRUS (HSV) I/II ANTIBODIES IGG & IGM W/ REFLEX
Herpes Type 1/2 IgM Combined: 0.47 IV (ref <=0.89)
Herpes Type I/II IgG Combined: 0.18 IV

## 2018-10-16 LAB — HEMOGLOBINOPATHY EVALUATION
HEMOGLOBIN A-1 QUANTITATION: 96.3 % (ref 95.0–97.9)
Hgb A2 Quant: 3.2 % (ref 2.0–3.5)
Hgb C Quant: 0 % (ref 0.0–0.0)
Hgb E Quant: 0 % (ref 0.0–0.0)
Hgb F Quant: 0.5 % (ref 0.0–2.1)
Hgb Other: 0 % (ref 0.0–0.0)
Hgb S Quant: 0 % (ref 0.0–0.0)

## 2018-11-09 ENCOUNTER — Ambulatory Visit
Admit: 2018-11-09 | Discharge: 2018-11-09 | Payer: BLUE CROSS/BLUE SHIELD | Attending: Obstetrics & Gynecology | Primary: Family

## 2018-11-09 DIAGNOSIS — Z3A18 18 weeks gestation of pregnancy: Secondary | ICD-10-CM

## 2018-11-09 MED ORDER — METAXALONE 800 MG PO TABS
800 MG | ORAL_TABLET | Freq: Three times a day (TID) | ORAL | 0 refills | Status: AC
Start: 2018-11-09 — End: 2018-11-19

## 2018-11-09 NOTE — Progress Notes (Signed)
Janet Bentley is a 28 y.o. female [redacted]w[redacted]d who presents for routine prenatal visit.  The patient was seen and evaluated. There was positive fetal movements. No contractions, bleeding or leakage of fluid. Signs and symptoms of preterm labor as well as labor were reviewed. The S/S of Pre-Eclampsia were reviewed with the patient in detail. She is to report any of these if they occur. She currently denies any of these.  Pt c/o back spasms, states they are unbearable.   Pt states she wishes to have a c section, pt lost a full term baby during delivery in 2019.  Pt had delivered vaginally.   G2P1000  OB History     Gravida   2    Para   1    Term   1    Preterm        AB        Living   0       SAB        TAB        Ectopic        Molar        Multiple        Live Births   0            Reports IOL secondary to preeclampsia.  Resuscitation upon delivery was not successful.  States he had a heart rate the entire labor process.        Mother's Prenatal Vitals  BP: 112/69  Weight: 162 lb (73.5 kg)  Pulse: 71  Patient Position: Sitting  Prenatal Fetal Information  Fetal Heart Rate: US-134  Movement: Increased  NAD, A&O x3  NCAT  EOMI  Abd soft, Gravid, NT  Ext no edema, NT, FROM  Normal mood, affect and thought content        Assessment:   Diagnosis Orders   1. [redacted] weeks gestation of pregnancy     2. Muscle spasm  metaxalone (SKELAXIN) 800 MG tablet             Plan:  1. DAB precautions   2. Return in 4 weeks  3. Recommend pregnancy support belt  4. Will prescribe muscle   5. Needs work Physicist, medical for breaks and lunch.   6. Discussion regarding birth plan, pt wishes to have a c section secondary to neonatal loss.   7. Recommend ES Tylenol q 4 hrs for back pain.   I Garfield Cornea, am scribing for and in the presence of Dr. Suzzanne Cloud.  I, Dr. Suzzanne Cloud, personally performed the services described in this documentation as scribed by Garfield Cornea in my presence, and it is both accurate and complete.

## 2018-11-09 NOTE — Progress Notes (Signed)
Pt is here for prenatal appointment.  She denies spotting, cramping, contractions. She states she is feeling baby move some. She is having really bad back spasms. This has been going on for a couple days now.

## 2018-11-09 NOTE — Patient Instructions (Signed)
Patient Education        Weeks 18 to 22 of Your Pregnancy: Care Instructions  Your Care Instructions    Your baby is continuing to develop quickly. At this stage, babies can now suck their thumbs, grip firmly with their hands, and open and close their eyelids.  Sometime between 18 and 22 weeks, you will start to feel your baby move. At first, these small fetal movements feel like fluttering or "butterflies." Some women say that they feel like gas bubbles. As the baby grows, these movements will become stronger. You may also notice that your baby kicks and hiccups.  During this time, you may find that your nausea and fatigue are gone. Overall, you may feel better and have more energy than you did in your first trimester. But you may also have new discomforts now, such as sleep problems or leg cramps. This care sheet can help you ease these discomforts.  Follow-up care is a key part of your treatment and safety. Be sure to make and go to all appointments, and call your doctor if you are having problems. It's also a good idea to know your test results and keep a list of the medicines you take.  How can you care for yourself at home?  Ease sleep problems  ?? Avoid caffeine in drinks or chocolate late in the day.  ?? Get some exercise every day.  ?? Take a warm shower or bath before bed.  ?? Have a light snack or glass of milk at bedtime.  ?? Do relaxation exercises in bed to calm your mind and body.  ?? Support your legs and back with extra pillows. Try a pillow between your legs if you sleep on your side.  ?? Do not use sleeping pills or alcohol. They could harm your baby.  Ease leg cramps  ?? Do not massage your calf during the cramp.  ?? Sit on a firm bed or chair. Straighten your leg, and bend your foot (flex your ankle) slowly upward, toward your knee. Bend your toes up and down.  ?? Stand on a cool, flat surface. Stretch your toes upward, and take small steps walking on your heels.  ?? Use a heating pad or hot water bottle  to help with muscle ache.  Prevent leg cramps  ?? Be sure to get enough calcium. If you are worried that you are not getting enough, talk to your doctor.  ?? Exercise every day, and stretch your legs before bed.  ?? Take a warm bath before bed, and try leg warmers at night.  Where can you learn more?  Go to [email or web address] and sign in to your MyChart account. Enter W603 in the Search Health Information box to learn more about "Weeks 18 to 22 of Your Pregnancy: Care Instructions."     If you do not have an account, please click on the "Sign Up Now" link.  Current as of: Feb 01, 2018  Content Version: 12.3  ?? 2006-2019 [employer]wise, Inc[employer]. Care instructions adapted under license by [facility]. If you have questions about a medical condition or this instruction, always ask your healthcare professional. [employer], Inc[employer] disclaims any warranty or liability for your use of this information.

## 2018-12-07 ENCOUNTER — Encounter: Attending: Obstetrics & Gynecology | Primary: Family

## 2018-12-20 NOTE — Telephone Encounter (Signed)
Someone called for patient stating she was 26 weeks and spotting.

## 2019-01-01 ENCOUNTER — Encounter: Attending: Obstetrics & Gynecology | Primary: Family

## 2019-01-05 ENCOUNTER — Ambulatory Visit
Admit: 2019-01-05 | Discharge: 2019-01-05 | Payer: BLUE CROSS/BLUE SHIELD | Attending: Obstetrics & Gynecology | Primary: Family

## 2019-01-05 DIAGNOSIS — Z3403 Encounter for supervision of normal first pregnancy, third trimester: Secondary | ICD-10-CM

## 2019-01-05 LAB — CBC WITH AUTO DIFFERENTIAL
Basophils %: 0.5 % (ref 0.0–1.0)
Basophils Absolute: 0.1 10*3/uL (ref 0.00–0.20)
Eosinophils %: 1.1 % (ref 0.0–5.0)
Eosinophils Absolute: 0.1 10*3/uL (ref 0.00–0.60)
Hematocrit: 35 % — ABNORMAL LOW (ref 37.0–47.0)
Hemoglobin: 11.6 g/dL — ABNORMAL LOW (ref 12.0–16.0)
Immature Granulocytes #: 0.1 10*3/uL
Lymphocytes %: 14.9 % — ABNORMAL LOW (ref 20.0–40.0)
Lymphocytes Absolute: 1.7 10*3/uL (ref 1.1–4.5)
MCH: 32.3 pg — ABNORMAL HIGH (ref 27.0–31.0)
MCHC: 33.1 g/dL (ref 33.0–37.0)
MCV: 97.5 fL (ref 81.0–99.0)
MPV: 11.4 fL (ref 9.4–12.3)
Monocytes %: 6.1 % (ref 0.0–10.0)
Monocytes Absolute: 0.7 10*3/uL (ref 0.00–0.90)
Neutrophils %: 76.9 % — ABNORMAL HIGH (ref 50.0–65.0)
Neutrophils Absolute: 8.8 10*3/uL — ABNORMAL HIGH (ref 1.5–7.5)
Platelets: 279 10*3/uL (ref 130–400)
RBC: 3.59 M/uL — ABNORMAL LOW (ref 4.20–5.40)
RDW: 13.9 % (ref 11.5–14.5)
WBC: 11.5 10*3/uL — ABNORMAL HIGH (ref 4.8–10.8)

## 2019-01-05 LAB — GLUCOSE 1 HOUR POSTPRANDIAL: Glucose, 1Hr PP: 89 mg/dL (ref 75–140)

## 2019-01-05 MED ORDER — SERTRALINE HCL 25 MG PO TABS
25 MG | ORAL_TABLET | Freq: Every day | ORAL | 3 refills | Status: DC
Start: 2019-01-05 — End: 2019-01-31

## 2019-01-05 MED ORDER — TETANUS-DIPHTH-ACELL PERTUSSIS 5-2.5-18.5 LF-MCG/0.5 IM SUSP
Freq: Once | INTRAMUSCULAR | Status: AC
Start: 2019-01-05 — End: 2019-01-05
  Administered 2019-01-05: 14:00:00 0.5 mL via INTRAMUSCULAR

## 2019-01-05 NOTE — Progress Notes (Signed)
Pt is here for prenatal appointment.  She denies spotting, cramping, contractions.  She has been having some really bad moods, this has been going on for a while now and she has dealt with this in the past not pregnant, but now it has gotten worse.

## 2019-01-05 NOTE — Progress Notes (Signed)
 Janet Bentley is a 28 y.o. female [redacted]w[redacted]d who presents for routine prenatal visit.  The patient was seen and evaluated. There was normal fetal movements. No contractions, bleeding or leakage of fluid. Signs and symptoms of preterm labor as well as labor were reviewed. The S/S of Pre-Eclampsia were reviewed with the patient in detail. She is to report any of these if they occur. She currently denies any of these.      G2P1000  Janet Bentley is a 28 y.o. female with the following history as recorded in EpicCare:  There are no active problems to display for this patient.    Current Outpatient Medications   Medication Sig Dispense Refill   . sertraline  (ZOLOFT ) 25 MG tablet Take 1 tablet by mouth daily 30 tablet 3   . Prenatal MV-Min-Fe Fum-FA-DHA (PRENATAL 1 PO) Take by mouth       No current facility-administered medications for this visit.      Allergies: Patient has no known allergies.  No past medical history on file.  No past surgical history on file.  Family History   Problem Relation Age of Onset   . Diabetes Paternal Grandfather    . Diabetes Paternal Grandmother    . Diabetes Maternal Grandmother    . Diabetes Paternal Aunt      Social History     Tobacco Use   . Smoking status: Light Tobacco Smoker   . Smokeless tobacco: Never Used   Substance Use Topics   . Alcohol use: Not Currently     Frequency: Never         Mother's Prenatal Vitals  BP: 106/63  Weight: 172 lb (78 kg)  Pulse: 73  Patient Position: Sitting  Prenatal Fetal Information  Fetal Heart Rate: US -139  Movement: Present  Physical Exam  Constitutional:       General: She is not in acute distress.     Appearance: Normal appearance. She is not ill-appearing, toxic-appearing or diaphoretic.   HENT:      Head: Normocephalic and atraumatic.   Eyes:      Extraocular Movements: Extraocular movements intact.   Pulmonary:      Effort: Pulmonary effort is normal. No respiratory distress.   Abdominal:      Palpations: Abdomen is soft.      Tenderness: There is  no abdominal tenderness.   Musculoskeletal: Normal range of motion.         General: No swelling or tenderness.      Right lower leg: No edema.      Left lower leg: No edema.   Skin:     General: Skin is warm and dry.      Findings: No rash.   Neurological:      Mental Status: She is alert and oriented to person, place, and time.   Psychiatric:         Attention and Perception: Attention and perception normal.         Mood and Affect: Mood and affect normal.         Speech: Speech normal.         Behavior: Behavior normal. Behavior is cooperative.         Thought Content: Thought content normal.         Cognition and Memory: Cognition and memory normal.         Judgment: Judgment normal.           The patient had her 28 week labs in process.  T-Dap Vaccine (27-36 weeks): today      Assessment:   Diagnosis Orders   1. Encounter for supervision of normal first pregnancy in third trimester     2. [redacted] weeks gestation of pregnancy     3. Depression affecting pregnancy  sertraline  (ZOLOFT ) 25 MG tablet   4. Need for Tdap vaccination  Tetanus-Diphth-Acell Pertussis (BOOSTRIX ) injection 0.5 mL       Plan:  1. PTL precautions   2. Start Zoloft  at 25 mg.  Precautions given  3. Return in 2 weeks

## 2019-01-17 NOTE — Telephone Encounter (Signed)
Lm that she passed her glucose test and cbc was within normal limits.

## 2019-01-19 ENCOUNTER — Encounter: Attending: Obstetrics & Gynecology | Primary: Family

## 2019-01-22 ENCOUNTER — Encounter: Attending: Obstetrics & Gynecology | Primary: Family

## 2019-01-25 ENCOUNTER — Encounter: Attending: Obstetrics & Gynecology | Primary: Family

## 2019-01-26 ENCOUNTER — Encounter

## 2019-01-26 NOTE — Telephone Encounter (Signed)
From: [name]  To: Nicolette Bang, MD  Sent: 01/26/2019 1:26 PM CDT  Subject: Prescription Question    Hey doctor slatter I put a refill in for zoloft but I feel like I might need to go up on a higher dose on my medication.

## 2019-01-30 ENCOUNTER — Encounter

## 2019-01-31 MED ORDER — SERTRALINE HCL 50 MG PO TABS
50 MG | ORAL_TABLET | Freq: Every day | ORAL | 3 refills | Status: DC
Start: 2019-01-31 — End: 2020-05-28

## 2019-01-31 NOTE — Telephone Encounter (Signed)
Called and informed pt that a higher dose of zoloft has been sent to pharmacy as requested. Pt v/u.

## 2019-02-05 ENCOUNTER — Telehealth: Admit: 2019-02-05 | Discharge: 2019-02-05 | Payer: MEDICAID | Attending: Obstetrics & Gynecology | Primary: Family

## 2019-02-05 DIAGNOSIS — Z3483 Encounter for supervision of other normal pregnancy, third trimester: Secondary | ICD-10-CM

## 2019-02-05 NOTE — Progress Notes (Signed)
 02/05/2019    TELEHEALTH EVALUATION -- Audio/Visual (During COVID-19 public health emergency)   Janet Bentley is a 28 y.o. female [redacted]w[redacted]d who presents for routine prenatal visit.  The patient was seen and evaluated. There was normal fetal movements. No contractions, bleeding or leakage of fluid. Signs and symptoms of preterm labor as well as labor were reviewed. The S/S of Pre-Eclampsia were reviewed with the patient in detail. She is to report any of these if they occur. She currently denies any of these.      G2P1000  Janet Bentley is a 28 y.o. female with the following history as recorded in EpicCare:  There are no active problems to display for this patient.    Current Outpatient Medications   Medication Sig Dispense Refill   . sertraline  (ZOLOFT ) 50 MG tablet Take 1 tablet by mouth daily 30 tablet 3   . Prenatal MV-Min-Fe Fum-FA-DHA (PRENATAL 1 PO) Take by mouth       No current facility-administered medications for this visit.      Allergies: Patient has no known allergies.  No past medical history on file.  No past surgical history on file.  Family History   Problem Relation Age of Onset   . Diabetes Paternal Grandfather    . Diabetes Paternal Grandmother    . Diabetes Maternal Grandmother    . Diabetes Paternal Aunt      Social History     Tobacco Use   . Smoking status: Light Tobacco Smoker   . Smokeless tobacco: Never Used   Substance Use Topics   . Alcohol use: Not Currently     Frequency: Never            Physical Exam  Constitutional:       General: She is not in acute distress.     Appearance: Normal appearance. She is not ill-appearing, toxic-appearing or diaphoretic.   HENT:      Head: Normocephalic and atraumatic.   Eyes:      Extraocular Movements: Extraocular movements intact.   Pulmonary:      Effort: Pulmonary effort is normal. No respiratory distress.   Musculoskeletal: Normal range of motion.   Skin:     General: Skin is warm and dry.      Findings: No rash.   Neurological:      Mental Status: She  is alert and oriented to person, place, and time.   Psychiatric:         Attention and Perception: Attention and perception normal.         Mood and Affect: Mood and affect normal.         Speech: Speech normal.         Behavior: Behavior normal. Behavior is cooperative.         Thought Content: Thought content normal.         Cognition and Memory: Cognition and memory normal.         Judgment: Judgment normal.           The patient had her 28 week labs completed.    T-Dap Vaccine (27-36 weeks): completed    The patient was instructed on fetal kick counts and was given a kick sheet to complete every 8 hours. She was instructed that the baby should move at a minimum of ten times within one hour after a meal. The patient was instructed to lay down on her left side twenty minutes after eating and count movements for up to  one hour with a target value of ten movements.  She was instructed to notify the office if she did not make that target after two attempts or if after any attempt there was less than four movements.    The patient reports that the targets have been made Yes.    Assessment:   Diagnosis Orders   1. Encounter for supervision of other normal pregnancy in third trimester     2. [redacted] weeks gestation of pregnancy               Plan:  1. PTL precautions, FKC  2. Patient desires CS secondary to difficulty with L&D and ultimate neonatal loss.  Records reviewed.  No additional information provided  3. Questions regarding formula and hospital stay answered  4. Return in 2 weeks                        Janet Bentley is a 27 y.o. female being evaluated by a Virtual Visit (video visit) encounter to address concerns as mentioned above.  A caregiver was present when appropriate. Due to this being a Scientist, research (medical) (During COVID-19 public health emergency), evaluation of the following organ systems was limited: Vitals/Constitutional/EENT/Resp/CV/GI/GU/MS/Neuro/Skin/Heme-Lymph-Imm.  Pursuant to the emergency  declaration under the [facility] Act and the [employer], 1135 waiver authority and the [employer] Act, this Virtual Visit was conducted with patient's (and/or legal guardian's) consent, to reduce the patient's risk of exposure to COVID-19 and provide necessary medical care.  The patient (and/or legal guardian) has also been advised to contact this office for worsening conditions or problems, and seek emergency medical treatment and/or call 911 if deemed necessary.     Services were provided through a video synchronous discussion virtually to substitute for in-person clinic visit. Patient and provider were located at their individual homes.    --Eva JONETTA Bucky Shona, MD on 02/05/2019 at 3:59 PM    An electronic signature was used to authenticate this note.

## 2019-02-16 ENCOUNTER — Encounter

## 2019-02-17 ENCOUNTER — Inpatient Hospital Stay
Admit: 2019-02-17 | Discharge: 2019-02-18 | Disposition: A | Discharge status: Other Acute Inpatient Hospital | Payer: MEDICAID | Source: Ambulatory Visit | Admitting: Obstetrics & Gynecology

## 2019-02-17 DIAGNOSIS — O42913 Preterm premature rupture of membranes, unspecified as to length of time between rupture and onset of labor, third trimester: Principal | ICD-10-CM

## 2019-02-17 MED ORDER — MAGNESIUM SULFATE 4000 MG/100ML IVPB PREMIX (OB USE ONLY)
40 | Freq: Once | INTRAVENOUS | Status: AC
Start: 2019-02-17 — End: 2019-02-17
  Administered 2019-02-17: 22:00:00 4 g via INTRAVENOUS

## 2019-02-17 MED ORDER — NORMAL SALINE FLUSH 0.9 % IV SOLN
0.9 | INTRAVENOUS | Status: DC | PRN
Start: 2019-02-17 — End: 2019-02-18

## 2019-02-17 MED ORDER — ACETAMINOPHEN 650 MG RE SUPP
650 MG | RECTAL | Status: DC | PRN
Start: 2019-02-17 — End: 2019-02-18

## 2019-02-17 MED ORDER — ACETAMINOPHEN 325 MG PO TABS
325 MG | ORAL | Status: DC | PRN
Start: 2019-02-17 — End: 2019-02-18

## 2019-02-17 MED ORDER — ERYTHROMYCIN LACTOBIONATE 500 MG IV SOLR
500 MG | Freq: Four times a day (QID) | INTRAVENOUS | Status: DC
Start: 2019-02-17 — End: 2019-02-18
  Administered 2019-02-17: 23:00:00 250 mg via INTRAVENOUS

## 2019-02-17 MED ORDER — NORMAL SALINE FLUSH 0.9 % IV SOLN
0.9 | Freq: Two times a day (BID) | INTRAVENOUS | Status: DC
Start: 2019-02-17 — End: 2019-02-18

## 2019-02-17 MED ORDER — BETAMETHASONE SOD PHOS & ACET 6 (3-3) MG/ML IJ SUSP
6 (3-3) MG/ML | Freq: Once | INTRAMUSCULAR | Status: AC
Start: 2019-02-17 — End: 2019-02-17
  Administered 2019-02-17: 22:00:00 12 mg via INTRAMUSCULAR

## 2019-02-17 MED ORDER — SODIUM CHLORIDE 0.9 % IV SOLN (MINI-BAG)
0.9 % | INTRAVENOUS | Status: DC
Start: 2019-02-17 — End: 2019-02-18
  Administered 2019-02-18: 1 g via INTRAVENOUS

## 2019-02-17 MED ORDER — MAGNESIUM SULFATE 20000 MG/500 ML INFUSION
20 | INTRAVENOUS | Status: DC
Start: 2019-02-17 — End: 2019-02-18
  Administered 2019-02-17: 23:00:00 2 g/h via INTRAVENOUS

## 2019-02-17 MED ORDER — PROMETHAZINE HCL 25 MG PO TABS
25 | Freq: Four times a day (QID) | ORAL | Status: DC | PRN
Start: 2019-02-17 — End: 2019-02-18

## 2019-02-17 MED ORDER — LACTATED RINGERS IV SOLN
INTRAVENOUS | Status: DC
Start: 2019-02-17 — End: 2019-02-18
  Administered 2019-02-17: 23:00:00 via INTRAVENOUS

## 2019-02-17 MED ORDER — ONDANSETRON HCL 4 MG/2ML IJ SOLN
4 MG/2ML | Freq: Four times a day (QID) | INTRAMUSCULAR | Status: DC | PRN
Start: 2019-02-17 — End: 2019-02-18

## 2019-02-17 MED FILL — BETAMETHASONE SOD PHOS & ACET 6 (3-3) MG/ML IJ SUSP: 6 (3-3) MG/ML | INTRAMUSCULAR | Qty: 2

## 2019-02-17 MED FILL — ERYTHROCIN LACTOBIONATE 500 MG IV SOLR: 500 mg | INTRAVENOUS | Qty: 250

## 2019-02-17 MED FILL — MAGNESIUM SULFATE 4 GM/100ML IV SOLN: 4 GM/100ML | INTRAVENOUS | Qty: 100

## 2019-02-17 MED FILL — AMPICILLIN SODIUM 1 G IJ SOLR: 1 g | INTRAMUSCULAR | Qty: 1000

## 2019-02-17 MED FILL — MAGNESIUM SULFATE 20 GM/500ML IV SOLN: 20 GM/500ML | INTRAVENOUS | Qty: 500

## 2019-02-17 MED FILL — AMPICILLIN SODIUM 1 G IJ SOLR: 1 g | INTRAMUSCULAR | Qty: 1

## 2019-02-17 NOTE — Other (Signed)
Report given to Amy at [facility].

## 2019-02-17 NOTE — Other (Signed)
Contacted Dr. Sheryle Spray regarding patient request for pain medication. States to not give pain medication at this time, and to not do SVE unless patient in active labor.     [facility] states they will be here soon.

## 2019-02-17 NOTE — Other (Signed)
Pt presented to ER with gross rupture of membranes, clear fluid, no bleeding, positive fetal movement.  Pt quickly gowned and placed on monitors.  Midwife and Dr on call notified.

## 2019-02-17 NOTE — Other (Signed)
[facility] at bedside

## 2019-02-17 NOTE — Other (Signed)
Dr. Sheryle Spray at bedside. Ultrasound performed by Dr. Sheryle Spray to confirm vertex position. States patient can have clear liquid diet.

## 2019-02-17 NOTE — H&P (Signed)
OBSTETRICAL HISTORY AND PHYSICAL    Provider:  Farrel GobbleAmanda E Keyshia Orwick   Date: 02/17/2019  Time: 6:29 PM    Patient Name: Regan RakersDesiree Berk  Patient DOB: 02/20/1991  Room/Bed: 0219/0219-01    Primary Care Physician: No primary care provider on file.      Chief Complaint:  leakage of amniotic fluid    HPI: Regan RakersDesiree Mosley is a 28 y.o. G2P1000 at 223w5d weeks who presents PPROM. Not hurting. PNC with Dr. Robina AdeSlatter. History of term demise, during pushing.    Contractions: No  Rupture Of Membranes: Yes  Bleeding: No    Pregnancy Risk factors:  There is no problem list on file for this patient.      Allergies:  Allergies as of 02/17/2019   . (No Known Allergies)       Medications:  No current facility-administered medications on file prior to encounter.      Current Outpatient Medications on File Prior to Encounter   Medication Sig Dispense Refill   . sertraline (ZOLOFT) 50 MG tablet Take 1 tablet by mouth daily 30 tablet 3   . Prenatal MV-Min-Fe Fum-FA-DHA (PRENATAL 1 PO) Take by mouth             No past medical history on file.    No past surgical history on file.    OB History   Gravida Para Term Preterm AB Living   2 1 1  0 0 0   SAB TAB Ectopic Molar Multiple Live Births   0 0 0 0 0 0      # Outcome Date GA Lbr Len/2nd Weight Sex Delivery Anes PTL Lv   2 Current            1 Term 02/20/18 101100w0d  7 lb 4 oz (3.289 kg) M    FD      Complications: Preeclampsia       Family History   Problem Relation Age of Onset   . Diabetes Paternal Grandfather    . Diabetes Paternal Grandmother    . Diabetes Maternal Grandmother    . Diabetes Paternal Aunt        Social History     Socioeconomic History   . Marital status: Single     Spouse name: Not on file   . Number of children: Not on file   . Years of education: Not on file   . Highest education level: Not on file   Occupational History   . Not on file   Social Needs   . Financial resource strain: Not on file   . Food insecurity     Worry: Not on file     Inability: Not on file   . Transportation  needs     Medical: Not on file     Non-medical: Not on file   Tobacco Use   . Smoking status: Light Tobacco Smoker   . Smokeless tobacco: Never Used   Substance and Sexual Activity   . Alcohol use: Not Currently     Frequency: Never   . Drug use: Never   . Sexual activity: Yes     Partners: Male   Lifestyle   . Physical activity     Days per week: Not on file     Minutes per session: Not on file   . Stress: Not on file   Relationships   . Social Wellsite geologistconnections     Talks on phone: Not on file     Gets together: Not  on file     Attends religious service: Not on file     Active member of club or organization: Not on file     Attends meetings of clubs or organizations: Not on file     Relationship status: Not on file   . Intimate partner violence     Fear of current or ex partner: Not on file     Emotionally abused: Not on file     Physically abused: Not on file     Forced sexual activity: Not on file   Other Topics Concern   . Not on file   Social History Narrative   . Not on file       Review Of Systems:  A comprehensive review of systems was negative except for what was noted in the HPI.    Physical Exam:  Vitals:    02/17/19 1816   Resp: 20   Temp: 98 F (36.7 C)   TempSrc: Temporal         General appearance:  awake, alert, cooperative, no apparent distress, and appears stated age  Neurologic:  Awake, alert, oriented to name, place and time.Motor is 5 out of 5 bilaterally.  Lungs:  No increased work of breathing, good air exchange, clear to auscultation bilaterally, no crackles or wheezing  Heart:  Normal apical impulse, regular rate and rhythm  Abdomen: Gravid, soft, non-tender, non-distended    Pelvic Exam:  Cervix Check:   DILATION:  1 cm   EFFACEMENT:   90%   STATION:  -1 cm   CONSISTENCY:  soft   POSITION:  anterior        Lab Results:   Blood Type/Rh:    ABO/Rh   Date Value Ref Range Status   10/03/2018 O POS  Final     Antibody Screen:    Antibody Screen   Date Value Ref Range Status   10/03/2018 NEG  Final      Hemoglobin:   Hemoglobin   Date Value Ref Range Status   01/05/2019 11.6 (L) 12.0 - 16.0 g/dL Final     Hematocrit:   Hematocrit   Date Value Ref Range Status   01/05/2019 35.0 (L) 37.0 - 47.0 % Final     Platelet Count:   Platelets   Date Value Ref Range Status   01/05/2019 279 130 - 400 K/uL Final       Assessment:  Mikele Sifuentes is a 28 y.o. female  G2P1000 At [redacted]w[redacted]d   PPROM    Plan:  Admit for tocolysis, steroids   GBS Prophylaxis  Transfer to [facility], Dr. Arnoldo Morale accepting  Risks, benefits, alternatives and possible complications have been discussed in detail with the patient. Admission, and post admission procedures and expectations were discussed in detail. All questions were answered.      Diona Foley, MD

## 2019-02-17 NOTE — Progress Notes (Signed)
Dr. Sheryle Spray notified we are still waiting on [facility] arrival.

## 2019-02-18 MED FILL — AMPICILLIN SODIUM 1 G IJ SOLR: 1 g | INTRAMUSCULAR | Qty: 1000

## 2019-02-18 MED FILL — ERYTHROCIN LACTOBIONATE 500 MG IV SOLR: 500 mg | INTRAVENOUS | Qty: 250

## 2019-02-19 ENCOUNTER — Encounter: Attending: Obstetrics & Gynecology | Primary: Family

## 2019-03-12 NOTE — Telephone Encounter (Signed)
From: [name]  To: Nicolette Bang, MD  Sent: 03/09/2019 7:33 AM CDT  Subject: Prescription Question    Hey Dr.Slatter, I was wondering if I could get back on my ADHD medication now!?

## 2019-03-14 ENCOUNTER — Encounter: Attending: Family | Primary: Family

## 2019-03-20 ENCOUNTER — Encounter: Admit: 2019-03-20 | Discharge: 2019-03-20 | Payer: MEDICAID | Attending: Nurse Practitioner | Primary: Family

## 2019-03-20 DIAGNOSIS — 1 ERRONEOUS ENCOUNTER ICD10: Secondary | ICD-10-CM

## 2019-03-20 NOTE — Patient Instructions (Signed)
Patient Education        Cesarean Section: What to Expect at Home  Your Recovery     A cesarean section, or C-section, is surgery to deliver your baby through a cut, called an incision, that the doctor makes in your lower belly and uterus.  You may have some pain in your lower belly and need pain medicine for 1 to 2 weeks. You can expect some vaginal bleeding for several weeks. You will probably need about 6 weeks to fully recover.  It is important to take it easy while the incision is healing. Avoid heavy lifting, strenuous activities, or exercises that strain the belly muscles while you are recovering. Ask a family member or friend for help with housework, cooking, and shopping.  This care sheet gives you a general idea about how long it will take for you to recover. But each person recovers at a different pace. Follow the steps below to get better as quickly as possible.  How can you care for yourself at home?  Activity   Rest when you feel tired. Getting enough sleep will help you recover.   Try to walk each day. Start by walking a little more than you did the day before. Bit by bit, increase the amount you walk. Walking boosts blood flow and helps prevent pneumonia, constipation, and blood clots.   Avoid strenuous activities, such as bicycle riding, jogging, weightlifting, and aerobic exercise, for 6 weeks or until your doctor says it is okay.   Until your doctor says it is okay, do not lift anything heavier than your baby.   Do not do sit-ups or other exercises that strain the belly muscles for 6 weeks or until your doctor says it is okay.   Hold a pillow over your incision when you cough or take deep breaths. This will support your belly and decrease your pain.   You may shower as usual. Pat the incision dry when you are done.   You will have some vaginal bleeding. Wear sanitary pads. Do not douche or use tampons until your doctor says it is okay.   Ask your doctor when you can drive again.   You  will probably need to take at least 6 weeks off work. It depends on the type of work you do and how you feel.   Ask your doctor when it is okay for you to have sex.  Diet   You can eat your normal diet. If your stomach is upset, try bland, low-fat foods like plain rice, broiled chicken, toast, and yogurt.   Drink plenty of fluids (unless your doctor tells you not to).   You may notice that your bowel movements are not regular right after your surgery. This is common. Try to avoid constipation and straining with bowel movements. You may want to take a fiber supplement every day. If you have not had a bowel movement after a couple of days, ask your doctor about taking a mild laxative.   If you are breastfeeding, limit alcohol. Alcohol can cause a lack of energy and other health problems for the baby when a breastfeeding woman drinks heavily. It can also get in the way of a mom's ability to feed her baby or to care for the child in other ways. There isn't a lot of research about exactly how much alcohol can harm a baby. Having no alcohol is the safest choice for your baby. If you choose to have a drink now and then,  have only one drink, and limit the number of occasions that you have a drink. Wait to breastfeed at least 2 hours after you have a drink to reduce the amount of alcohol the baby may get in the milk.  Medicines   Your doctor will tell you if and when you can restart your medicines. He or she will also give you instructions about taking any new medicines.   If you take aspirin or some other blood thinner, ask your doctor if and when to start taking it again. Make sure that you understand exactly what your doctor wants you to do.   Take pain medicines exactly as directed.  ? If the doctor gave you a prescription medicine for pain, take it as prescribed.  ? If you are not taking a prescription pain medicine, ask your doctor if you can take an over-the-counter medicine.   If you think your pain medicine  is making you sick to your stomach:  ? Take your medicine after meals (unless your doctor has told you not to).  ? Ask your doctor for a different pain medicine.   If your doctor prescribed antibiotics, take them as directed. Do not stop taking them just because you feel better. You need to take the full course of antibiotics.  Incision care   If you have strips of tape on the incision, leave the tape on for a week or until it falls off.   Wash the area daily with warm, soapy water, and pat it dry. Don't use hydrogen peroxide or alcohol, which can slow healing. You may cover the area with a gauze bandage if it weeps or rubs against clothing. Change the bandage every day.   Keep the area clean and dry.  Other instructions   If you breastfeed your baby, you may be more comfortable while you are healing if you place the baby so that he or she is not resting on your belly. Try tucking your baby under your arm, with his or her body along the side you will be feeding on. Support your baby's upper body with your arm. With that hand you can control your baby's head to bring his or her mouth to your breast. This is sometimes called the football hold.  Follow-up care is a key part of your treatment and safety. Be sure to make and go to all appointments, and call your doctor if you are having problems. It's also a good idea to know your test results and keep a list of the medicines you take.  When should you call for help?   Call  911 anytime you think you may need emergency care. For example, call if:   You have thoughts of harming yourself, your baby, or another person.   You passed out (lost consciousness).   You have chest pain, are short of breath, or cough up blood.   You have a seizure.  Call your doctor now or seek immediate medical care if:   You have pain that does not get better after you take pain medicine.   You have severe vaginal bleeding.   You are dizzy or lightheaded, or you feel like you may  faint.   You have new or worse pain in your belly or pelvis.   You have loose stitches, or your incision comes open.   You have symptoms of infection, such as:  ? Increased pain, swelling, warmth, or redness.  ? Red streaks leading from the incision.  ?  Pus draining from the incision.  ? A fever.   You have symptoms of a blood clot in your leg (called a deep vein thrombosis), such as:  ? Pain in your calf, back of the knee, thigh, or groin.  ? Redness and swelling in your leg or groin.   You have signs of preeclampsia, such as:  ? Sudden swelling of your face, hands, or feet.  ? New vision problems (such as dimness, blurring, or seeing spots).  ? A severe headache.  Watch closely for changes in your health, and be sure to contact your doctor if:   You do not get better as expected.  Where can you learn more?  Go to [email or web address] and sign in to your MyChart account. Enter M806 in the Search Health Information box to learn more about "Cesarean Section: What to Expect at Home."     If you do not have an account, please click on the "Sign Up Now" link.  Current as of: February 11, 2020Content Version: 12.5   2006-2020 [employer].   Care instructions adapted under license by [facility]. If you have questions about a medical condition or this instruction, always ask your healthcare professional. [employer], Inc[employer] disclaims any warranty or liability for your use of this information.

## 2019-03-22 NOTE — Progress Notes (Signed)
Pt left before being seen  This encounter was created in error - please disregard.

## 2019-04-25 ENCOUNTER — Encounter: Admit: 2019-04-25 | Discharge: 2019-04-25 | Attending: Obstetrics & Gynecology | Primary: Family

## 2019-04-25 ENCOUNTER — Encounter: Admit: 2019-04-25 | Discharge: 2019-04-26 | Payer: MEDICAID | Attending: Obstetrics & Gynecology | Primary: Family

## 2019-04-25 DIAGNOSIS — 1 ERRONEOUS ENCOUNTER ICD10: Secondary | ICD-10-CM

## 2019-04-26 ENCOUNTER — Telehealth: Admit: 2019-04-26 | Discharge: 2019-04-26 | Payer: MEDICAID | Attending: Obstetrics & Gynecology | Primary: Family

## 2019-04-26 NOTE — Progress Notes (Signed)
04/26/2019    TELEHEALTH EVALUATION -- Audio/Visual (During COVID-19 public health emergency)    HPI:    Janet Bentley (DOB:  October 09, 1990) has requested an audio/video evaluation for the following concern(s):    Pt presents today for an 8 week postpartum visit following a c section on 02-23-19. She wishes to discuss contraceptive options.   She desires LARC.    Review of Systems   Constitutional: Negative.    HENT: Negative.    Eyes: Negative.    Respiratory: Negative.    Cardiovascular: Negative.    Gastrointestinal: Negative.    Endocrine: Negative.    Genitourinary: Negative.    Musculoskeletal: Negative.    Skin: Negative.    Allergic/Immunologic: Negative.    Neurological: Negative.    Hematological: Negative.    Psychiatric/Behavioral: Negative.        Past Medical History:   Diagnosis Date   . 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 Mental disorder     anxiety/depression       Past Surgical History:   Procedure Laterality Date   . APPENDECTOMY         Family History   Problem Relation Age of Onset   . Diabetes Paternal Grandfather    . Diabetes Paternal Grandmother    . Diabetes Maternal Grandmother    . Diabetes Paternal Aunt        Social History     Tobacco Use   . Smoking status: Light Tobacco Smoker     Types: Cigarettes   . Smokeless tobacco: Never Used   Substance Use Topics   . Alcohol use: Not Currently     Frequency: Never   . Drug use: Never       Prior to Visit Medications    Medication Sig Taking? Authorizing Provider   sertraline (ZOLOFT) 50 MG tablet Take 1 tablet by mouth daily  [place] D Braulio Bosch, MD   Prenatal MV-Min-Fe Fum-FA-DHA (PRENATAL 1 PO) Take by mouth  Historical Provider, MD       No Known Allergies          PHYSICAL EXAMINATION  Physical Exam  Constitutional:       General: She is not in acute distress.     Appearance: Normal appearance. She is not ill-appearing or diaphoretic.   HENT:      Head: Normocephalic and atraumatic.      Nose: Nose normal.  No rhinorrhea.   Eyes:      General: No scleral icterus.        Right eye: No discharge.         Left eye: No discharge.      Extraocular Movements: Extraocular movements intact.   Pulmonary:      Effort: Pulmonary effort is normal. No respiratory distress.   Musculoskeletal: Normal range of motion.   Skin:     Coloration: Skin is not pale.      Findings: No erythema or rash.   Neurological:      Mental Status: She is alert and oriented to person, place, and time.   Psychiatric:         Attention and Perception: Attention and perception normal.         Mood and Affect: Mood and affect normal.         Speech: Speech normal.         Behavior: Behavior normal. Behavior is cooperative.  Thought Content: Thought content normal.         Cognition and Memory: Cognition and memory normal.         Judgment: Judgment normal.           ASSESSMENT   Diagnosis Orders   1. Postpartum care following cesarean delivery     2. Encounter for screening for maternal depression         PLAN  1.  Discussed options with patient including IUD, Nexplanon, Vaginal ring and Depo.  Patient wishes to try Nexplanon  2. Order placed.  Will notify patient of response  3. All questions answered  4. Return for Nexplanon placement  I Garfield Cornea, am scribing for and in the presence of Dr. Suzzanne Cloud.    Janet Bentley is a 28 y.o. female being evaluated by a Virtual Visit (video visit) encounter to address concerns as mentioned above.  A caregiver was present when appropriate. Due to this being a Scientist, research (medical) (During COVID-19 public health emergency), evaluation of the following organ systems was limited: Vitals/Constitutional/EENT/Resp/CV/GI/GU/MS/Neuro/Skin/Heme-Lymph-Imm.  Pursuant to the emergency declaration under the [facility] Act and the [employer], 1135 waiver authority and the [employer] and [employer] Act, this Virtual Visit was conducted with patient's (and/or legal  guardian's) consent, to reduce the patient's risk of exposure to COVID-19 and provide necessary medical care.  The patient (and/or legal guardian) has also been advised to contact this office for worsening conditions or problems, and seek emergency medical treatment and/or call 911 if deemed necessary.     Patient identification was verified at the start of the visit: Yes    Total time spent on this encounter: 15    Services were provided through a video synchronous discussion virtually to substitute for in-person clinic visit. Patient and provider were located at their individual homes.    --Jerline Pain, RN on 04/26/2019 at 12:58 PM    An electronic signature was used to authenticate this note.

## 2019-05-21 NOTE — Progress Notes (Signed)
error 

## 2019-07-23 ENCOUNTER — Encounter: Attending: Obstetrics & Gynecology | Primary: Family

## 2019-07-23 NOTE — Telephone Encounter (Signed)
From: [name]  Sent: 07/23/2019 10:48 AM CST  To: [facility]  Subject: RE: [name]    Anytime would be fine    ----- Message -----  From: Loney Loh  Sent: 07/23/19 10:58 AM  To: [name]  Subject: RE: nexplanon    Let's do it here. What is a good day of the week for you?    [name], [place]  [facility]      ----- Message -----   From:[name]   Sent:07/23/2019 10:24 AM EST   ZO:[id number] [name]   Subject:RE: nexplanon    I tried calling to reschedule my appointment.      ----- Message -----   From:Nurse [name]   Sent:07/12/2019 10:57 AM EST   AV:[id number] [name]   Subject:RE: nexplanon    Hi Janet,  As soon as it is delivered, someone will notify you and set up an appointment for insertion.   Angie RN      ----- Message -----   From:Janet Bentley   Sent:07/12/2019 10:36 AM EST   JY:[id number] [name]   Subject:RE: nexplanon    They said my nexplanon will be delivered today.       ----- Message -----   From:Nurse [name]   Sent:07/05/2019 10:53 AM EDT   AO:[id number] [name]   Subject:RE: nexplanon    Hi Janet Bentley,  It looks like the Nexplanon is going to be covered, I will have to do a prior authorization. They may call you to finalize the order. We will let you know when it is delivered to the office.   Angie. RN      ----- Message -----   From:Janet Bentley   Sent:07/05/2019 10:17 AM EDT   QI:ONGEX [name]   Subject:RE: nexplanon      I was checking on my birth control       ----- Message -----   From:Nurse [name]   Sent:06/21/2019 2:54 PM EDT   BM:[id number] [name]   Subject:RE: nexplanon    Hi Janet Bentley,  Yes, I will resend the form today.   Angie RN      ----- Message -----   From:Janet Bentley   Sent:06/21/2019 2:51 PM EDT   WN:UUVOZ [name]   Subject:RE: nexplanon    Did you get the attachment of my Medicaid card?      ----- Message -----   From:Nurse [name]   Sent:06/21/2019 12:36 PM EDT    DG:[id number] [name]   Subject:RE: nexplanon    Hi  Janet Bentley,  We have a different number. Is there any way you can attach a picture of your card to a message or fax a copy?  Angie RN      ----- Message -----   From:Janet Bentley   Sent:06/21/2019 11:53 AM EDT   QQ:[id number] [name]   Subject:RE: nexplanon    My medicaid ID- [id number]      ----- Message -----   From:Nurse [name]   Sent:06/19/2019 6:03 PM EDT   JO:[id number] [name]   Subject:nexplanon    Hi Janet Bentley,  I was checking to see if you got your insurance reinstated? If so, we can try to order the Nexplanon again.   Angie RN

## 2019-07-23 NOTE — Progress Notes (Signed)
Error

## 2019-07-24 NOTE — Telephone Encounter (Signed)
From: [name]  To: Nicolette Bang, MD  Sent: 07/23/2019 1:44 PM CST  Subject: Prescription Question    I was wondering if I could get on birth control pills till I can get my nexplanon. Cause I want be able to get it in till January

## 2019-07-27 ENCOUNTER — Encounter

## 2019-07-27 NOTE — Telephone Encounter (Signed)
From: [name]  Sent: 07/27/2019 12:38 PM CST  To: [facility]  Subject: RE: hcg    It happened last Saturday     ----- Message -----  From: Roseanna Rainbow, APRN  Sent: 07/27/19 1:35 PM  To: [name]  Subject: hcg    Janet Bentley, your hcg was 49. So not quite back down to 0 yet. When was your miscarriage. This needs to be followed back down to zero to make sure you bleed everything out. Thanks

## 2019-08-09 MED ORDER — NORETHIN ACE-ETH ESTRAD-FE 1-20 MG-MCG PO TABS
1-20 MG-MCG | PACK | Freq: Every day | ORAL | 1 refills | Status: DC
Start: 2019-08-09 — End: 2019-09-27

## 2019-09-27 MED ORDER — JUNEL FE 1/20 1-20 MG-MCG PO TABS
1-20 MG-MCG | ORAL_TABLET | ORAL | 1 refills | Status: DC
Start: 2019-09-27 — End: 2019-10-04

## 2019-10-01 ENCOUNTER — Encounter: Attending: Obstetrics & Gynecology | Primary: Family

## 2019-10-04 ENCOUNTER — Ambulatory Visit: Admit: 2019-10-04 | Discharge: 2019-10-04 | Payer: MEDICAID | Attending: Obstetrics & Gynecology | Primary: Family

## 2019-10-04 DIAGNOSIS — Z30017 Encounter for initial prescription of implantable subdermal contraceptive: Secondary | ICD-10-CM

## 2019-10-04 LAB — POCT URINE PREGNANCY

## 2019-10-04 NOTE — Progress Notes (Signed)
PRE-OP DIAGNOSIS:  Family planning  POSTOP DIAGNOSIS: Same  PROCEDURE:informed consent was obtained.  UPT was negative.site was selected 8 cm from medial epicondyle and marked along with guiding site using sterile markerarea was prepped and draped in a sterile fashion.  3 mL of 1% lidocaine was used for subcutaneous anesthesia. Anesthesia confirmed. trocar was inserted subcutaneously and then Nexplanon  capsule delivered subcutaneouslywas removed from the insertion site. capsule was palpated by provider and patient to assure satisfactory placement.blood loss of <10 mLstrips applied and arm wrapped with dressing.: The patient tolerated the procedure well without complications. Standard post-procedure care is explained and return precautions are given.    LOT [id number]  Exp Sep 12, 2021        Plan:  1. Nexplanon inserted  2. RTC prn    I Garfield Cornea, am scribing for and in the presence of Dr. Suzzanne Cloud.  I, Dr. Suzzanne Cloud, personally performed the services described in this documentation as scribed by Garfield Cornea in my presence, and it is both accurate and complete.

## 2020-05-28 ENCOUNTER — Inpatient Hospital Stay: Admit: 2020-05-28 | Discharge: 2020-05-29 | Disposition: A | Discharge status: Home / Self Care | Payer: MEDICAID

## 2020-05-28 ENCOUNTER — Emergency Department: Admit: 2020-05-29 | Payer: MEDICAID | Primary: Family

## 2020-05-28 DIAGNOSIS — R1011 Right upper quadrant pain: Secondary | ICD-10-CM

## 2020-05-28 NOTE — ED Provider Notes (Signed)
[facility]  eMERGENCYdEPARTMENT eNCOUnter      Pt Name: Janet Bentley  MRN: [id number]  Birthdate [birth year]  Date of evaluation: 05/28/2020  Provider:Keny Donald, PA    CHIEF COMPLAINT       Chief Complaint   Patient presents with   ??? Abdominal Pain     x several weeks, seen at [facility] twice already         HISTORY OF PRESENT ILLNESS  (Location/Symptom, Timing/Onset, Context/Setting, Quality, Duration, Modifying Factors, Severity.)   Janet Bentley is a 29 y.o. female who presents to the emergency department with intermittent RUQ for several weeks feels it is getting worse subjective fever with night sweats has a gallbladder. She denies GU symptoms last period 2 day ago. She has been seen at [facility] twice "gallbladder was swollen on [place]" she endorses nausea vomiting. She endorses occasional loose stools but no blood. Has PCP has not seen GI.     HPI    Nursing Notes were reviewed and I agree.    REVIEW OF SYSTEMS    (2-9 systems for level 4, 10 or more for level 5)     Review of Systems   Constitutional: Negative for activity change, appetite change, chills and fever.   HENT: Negative for congestion, postnasal drip, rhinorrhea and sore throat.    Eyes: Negative for photophobia, pain, discharge and visual disturbance.   Respiratory: Negative for apnea, cough and shortness of breath.    Cardiovascular: Negative for chest pain and leg swelling.   Gastrointestinal: Positive for abdominal pain, diarrhea, nausea and vomiting. Negative for abdominal distention.   Genitourinary: Negative for vaginal bleeding.   Musculoskeletal: Negative for arthralgias, back pain, joint swelling, neck pain and neck stiffness.   Skin: Negative for color change and rash.   Neurological: Negative for dizziness, syncope, facial asymmetry and headaches.   Hematological: Negative for adenopathy. Does not bruise/bleed easily.   Psychiatric/Behavioral: Negative for agitation, behavioral problems and confusion.        Except as noted above the  remainder of the review of systems was reviewed and negative.       PAST MEDICAL HISTORY     Past Medical History:   Diagnosis Date   ??? Heart abnormality     chf?  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 20   ??? Mental disorder     anxiety/depression         SURGICAL HISTORY       Past Surgical History:   Procedure Laterality Date   ??? APPENDECTOMY           CURRENT MEDICATIONS       Discharge Medication List as of 05/28/2020 10:29 PM      CONTINUE these medications which have NOT CHANGED    Details   busPIRone (BUSPAR) 10 MG tablet Take 10 mg by mouth 2 times dailyHistorical Med             ALLERGIES     Patient has no known allergies.    FAMILY HISTORY       Family History   Problem Relation Age of Onset   ??? Diabetes Paternal Grandfather    ??? Diabetes Paternal Grandmother    ??? Diabetes Maternal Grandmother    ??? Diabetes Paternal Aunt           SOCIAL HISTORY       Social History     Socioeconomic History   ??? Marital status: Single     Spouse name:  None   ??? Number of children: None   ??? Years of education: None   ??? Highest education level: None   Occupational History   ??? None   Tobacco Use   ??? Smoking status: Light Tobacco Smoker     Types: Cigarettes   ??? Smokeless tobacco: Never Used   Vaping Use   ??? Vaping Use: Never used   Substance and Sexual Activity   ??? Alcohol use: Not Currently   ??? Drug use: Never   ??? Sexual activity: Yes     Partners: Male   Other Topics Concern   ??? None   Social History Narrative   ??? None     Social Determinants of Health     Financial Resource Strain:    ??? Difficulty of Paying Living Expenses:    Food Insecurity:    ??? Worried About Programme researcher, broadcasting/film/video in the Last Year:    ??? Barista in the Last Year:    Transportation Needs:    ??? Freight forwarder (Medical):    ??? Lack of Transportation (Non-Medical):    Physical Activity:    ??? Days of Exercise per Week:    ??? Minutes of Exercise per Session:    Stress:    ??? Feeling of Stress :    Social Connections:    ??? Frequency of Communication  with Friends and Family:    ??? Frequency of Social Gatherings with Friends and Family:    ??? Attends Religious Services:    ??? Database administrator or Organizations:    ??? Attends Engineer, structural:    ??? Marital Status:    Intimate Programme researcher, broadcasting/film/video Violence:    ??? Fear of Current or Ex-Partner:    ??? Emotionally Abused:    ??? Physically Abused:    ??? Sexually Abused:        SCREENINGS           PHYSICAL EXAM    (up to 7 forlevel 4, 8 or more for level 5)     ED Triage Vitals [05/28/20 2003]   BP Temp Temp Source Pulse Resp SpO2 Height Weight   135/82 98.5 ??F (36.9 ??C) Oral 87 16 98 % -- --       Physical Exam  Vitals and nursing note reviewed.   Constitutional:       General: She is not in acute distress.     Appearance: She is well-developed. She is not diaphoretic.   HENT:      Head: Normocephalic and atraumatic.      Right Ear: External ear normal.      Left Ear: External ear normal.      Mouth/Throat:      Mouth: Mucous membranes are moist.      Pharynx: No oropharyngeal exudate.   Eyes:      General:         Right eye: No discharge.         Left eye: No discharge.      Extraocular Movements: Extraocular movements intact.      Pupils: Pupils are equal, round, and reactive to light.   Neck:      Thyroid: No thyromegaly.   Cardiovascular:      Rate and Rhythm: Normal rate and regular rhythm.      Heart sounds: Normal heart sounds. No murmur heard.   No friction rub.   Pulmonary:      Effort: Pulmonary effort  is normal. No respiratory distress.      Breath sounds: Normal breath sounds. No stridor. No wheezing.   Abdominal:      General: Bowel sounds are normal. There is no distension.      Palpations: Abdomen is soft.      Tenderness: There is abdominal tenderness in the right upper quadrant.   Musculoskeletal:         General: Normal range of motion.      Cervical back: Normal range of motion and neck supple.   Skin:     General: Skin is warm and dry.      Capillary Refill: Capillary refill takes less than 2 seconds.       Findings: No rash.   Neurological:      Mental Status: She is alert and oriented to person, place, and time.      Cranial Nerves: No cranial nerve deficit.      Sensory: No sensory deficit.      Coordination: Coordination normal.   Psychiatric:         Behavior: Behavior normal.         Thought Content: Thought content normal.           DIAGNOSTIC RESULTS     RADIOLOGY:   Non-plain film images such as CT, Ultrasound and MRI are read by the radiologist. Plain radiographic images are visualized and preliminarilyinterpreted by No att. providers found with the below findings:      Interpretation per the Radiologist below, if available at the time of this note:    CT ABDOMEN PELVIS W IV CONTRAST Additional Contrast? None   Final Result   No acute abdominal pelvic abnormalities.   Signed by Dr Micheline Maze          LABS:  Labs Reviewed   CBC WITH AUTO DIFFERENTIAL - Abnormal; Notable for the following components:       Result Value    WBC 11.4 (*)     MCH 31.8 (*)     Neutrophils % 72.7 (*)     Neutrophils Absolute 8.3 (*)     All other components within normal limits   COMPREHENSIVE METABOLIC PANEL - Abnormal; Notable for the following components:    Potassium 3.4 (*)     CO2 21 (*)     All other components within normal limits   URINE RT REFLEX TO CULTURE - Abnormal; Notable for the following components:    Ketones, Urine 40 (*)     Blood, Urine SMALL (*)     All other components within normal limits   LIPASE   HCG, SERUM, QUALITATIVE   MICROSCOPIC URINALYSIS       All other labs were within normal range or notreturned as of this dictation.    RE-ASSESSMENT        EMERGENCY DEPARTMENT COURSE and DIFFERENTIAL DIAGNOSIS/MDM:   Vitals:    Vitals:    05/28/20 2003 05/28/20 2151 05/28/20 2236   BP: 135/82 122/71 (!) 145/85   Pulse: 87 61 71   Resp: 16 12 20    Temp: 98.5 ??F (36.9 ??C)     TempSrc: Oral     SpO2: 98% 97% 99%       MDM  Nothing acutely appreciated on patient's gallbladder in regards to thickening bile  retention or stone.  Nothing in the common bile duct overall unremarkable CT scan patient feeling much better here easily treated with medicines I feel that this is something can be  treated in outpatient setting she is educated to [facility].  Guarded prognosis may be an underactive gallbladder but at this time feel appropriate for outpatient follow-up.    PROCEDURES:    Procedures      FINAL IMPRESSION      1. Abdominal pain, right upper quadrant          DISPOSITION/PLAN   DISPOSITION Decision To Discharge 05/28/2020 10:15:20 PM      PATIENT REFERRED TO:  [facility]  [address]  [place] [id number]  [phone]    If symptoms worsen    Raiford Noblelinton J Kaufman, DO  [address]  [address]  [place] [id number]  [phone]          [facility]  [address]  [place] [id number]  [phone]  Schedule an appointment as soon as possible for a visit   HIDA scan      DISCHARGE MEDICATIONS:  Discharge Medication List as of 05/28/2020 10:29 PM      START taking these medications    Details   dicyclomine (BENTYL) 10 MG capsule Take 1 capsule by mouth 4 times daily (before meals and nightly), Disp-120 capsule, R-3Normal             (Please note that portions of this note were completed with a voice recognition program.  Efforts were made to edit the dictations but occasionallywords are mis-transcribed.)    Cristy Friedlandererek Jatavius Ellenwood, PA      Cristy Friedlandererek Cieanna Stormes, [place]  05/29/20 1621

## 2020-05-29 LAB — URINALYSIS WITH REFLEX TO CULTURE
Bilirubin Urine: NEGATIVE
Glucose, Ur: NEGATIVE mg/dL
Ketones, Urine: 40 mg/dL — AB
Leukocyte Esterase, Urine: NEGATIVE
Nitrite, Urine: NEGATIVE
Protein, UA: NEGATIVE mg/dL
Specific Gravity, UA: 1.014 (ref 1.005–1.030)
Urobilinogen, Urine: 1 E.U./dL (ref <2.0)
pH, UA: 5.5 (ref 5.0–8.0)

## 2020-05-29 LAB — CBC WITH AUTO DIFFERENTIAL
Basophils %: 0.4 % (ref 0.0–1.0)
Basophils Absolute: 0.1 10*3/uL (ref 0.00–0.20)
Eosinophils %: 0.9 % (ref 0.0–5.0)
Eosinophils Absolute: 0.1 10*3/uL (ref 0.00–0.60)
Hematocrit: 42.1 % (ref 37.0–47.0)
Hemoglobin: 13.9 g/dL (ref 12.0–16.0)
Immature Granulocytes #: 0 10*3/uL
Lymphocytes %: 20.6 % (ref 20.0–40.0)
Lymphocytes Absolute: 2.3 10*3/uL (ref 1.1–4.5)
MCH: 31.8 pg — ABNORMAL HIGH (ref 27.0–31.0)
MCHC: 33 g/dL (ref 33.0–37.0)
MCV: 96.3 fL (ref 81.0–99.0)
MPV: 10.9 fL (ref 9.4–12.3)
Monocytes %: 5.1 % (ref 0.0–10.0)
Monocytes Absolute: 0.6 10*3/uL (ref 0.00–0.90)
Neutrophils %: 72.7 % — ABNORMAL HIGH (ref 50.0–65.0)
Neutrophils Absolute: 8.3 10*3/uL — ABNORMAL HIGH (ref 1.5–7.5)
Platelets: 289 10*3/uL (ref 130–400)
RBC: 4.37 M/uL (ref 4.20–5.40)
RDW: 12.5 % (ref 11.5–14.5)
WBC: 11.4 10*3/uL — ABNORMAL HIGH (ref 4.8–10.8)

## 2020-05-29 LAB — COMPREHENSIVE METABOLIC PANEL
ALT: 10 U/L (ref 5–33)
AST: 10 U/L (ref 5–32)
Albumin: 4.6 g/dL (ref 3.5–5.2)
Alkaline Phosphatase: 67 U/L (ref 35–104)
Anion Gap: 14 mmol/L (ref 7–19)
BUN: 12 mg/dL (ref 6–20)
CO2: 21 mmol/L — ABNORMAL LOW (ref 22–29)
Calcium: 9.5 mg/dL (ref 8.6–10.0)
Chloride: 102 mmol/L (ref 98–111)
Creatinine: 0.7 mg/dL (ref 0.5–0.9)
GFR African American: >59 (ref >59)
GFR Non-African American: >60 (ref >60)
Glucose: 97 mg/dL (ref 74–109)
Potassium: 3.4 mmol/L — ABNORMAL LOW (ref 3.5–5.0)
Sodium: 137 mmol/L (ref 136–145)
Total Bilirubin: 0.4 mg/dL (ref 0.2–1.2)
Total Protein: 7.8 g/dL (ref 6.6–8.7)

## 2020-05-29 LAB — LIPASE: Lipase: 17 U/L (ref 13–60)

## 2020-05-29 LAB — MICROSCOPIC URINALYSIS

## 2020-05-29 LAB — HCG, SERUM, QUALITATIVE: hCG Qual: NEGATIVE

## 2020-05-29 MED ORDER — MORPHINE SULFATE 4 MG/ML IJ SOLN
4 MG/ML | Freq: Once | INTRAMUSCULAR | Status: AC
Start: 2020-05-29 — End: 2020-05-28
  Administered 2020-05-29: 01:00:00 4 mg via INTRAVENOUS

## 2020-05-29 MED ORDER — DICYCLOMINE HCL 10 MG PO CAPS
10 MG | ORAL_CAPSULE | Freq: Four times a day (QID) | ORAL | 3 refills | Status: DC
Start: 2020-05-29 — End: 2020-10-06

## 2020-05-29 MED ORDER — ONDANSETRON HCL 4 MG/2ML IJ SOLN
4 MG/2ML | Freq: Once | INTRAMUSCULAR | Status: AC
Start: 2020-05-29 — End: 2020-05-28
  Administered 2020-05-29: 01:00:00 4 mg via INTRAVENOUS

## 2020-05-29 MED ORDER — SODIUM CHLORIDE 0.9 % IV BOLUS
0.9 % | Freq: Once | INTRAVENOUS | Status: AC
Start: 2020-05-29 — End: 2020-05-28
  Administered 2020-05-29: 01:00:00 1000 mL via INTRAVENOUS

## 2020-05-29 MED ORDER — IOPAMIDOL 76 % IV SOLN
76 % | Freq: Once | INTRAVENOUS | Status: AC | PRN
Start: 2020-05-29 — End: 2020-05-28
  Administered 2020-05-29: 01:00:00 90 mL via INTRAVENOUS

## 2020-05-29 MED FILL — MORPHINE SULFATE 4 MG/ML IJ SOLN: 4 mg/mL | INTRAMUSCULAR | Qty: 1

## 2020-05-29 MED FILL — ONDANSETRON HCL 4 MG/2ML IJ SOLN: 4 MG/2ML | INTRAMUSCULAR | Qty: 2

## 2020-09-25 ENCOUNTER — Encounter: Payer: MEDICAID | Primary: Family

## 2020-09-25 NOTE — Telephone Encounter (Signed)
From: [name]  To: Dr. Nicolette Bang  Sent: 09/25/2020 12:17 PM CST  Subject: My nexplanon    Hey so I have a question about my nexplanon so I started having chest pains a few months ago and it comes and goes and I been to the cardiologist to get checked out and they said my heart is good but I still have them here and there and having really bad back pain could my nexplanon make me have these symptoms?Marland Kitchen

## 2020-10-06 ENCOUNTER — Ambulatory Visit: Admit: 2020-10-06 | Discharge: 2020-10-06 | Payer: MEDICAID | Attending: Obstetrics & Gynecology | Primary: Family

## 2020-10-06 DIAGNOSIS — Z3046 Encounter for surveillance of implantable subdermal contraceptive: Secondary | ICD-10-CM

## 2020-10-06 MED ORDER — NORETHINDRONE 0.35 MG PO TABS
0.35 MG | ORAL_TABLET | Freq: Every day | ORAL | 11 refills | Status: DC
Start: 2020-10-06 — End: 2021-01-07

## 2020-10-06 NOTE — Progress Notes (Signed)
Janet Bentley is a 30 y.o.  who presents today for her Nexplanon removal.  She is experiencing deep tissue breast pain, back aches and headaches. She states she is having migraines with aura.  She has been to her PCP.   She had it inserted on 10-04-19. She desires an alternative form of contraception.    Review of Systems   Constitutional: Negative.    HENT: Negative.    Eyes: Negative.    Respiratory: Negative.    Cardiovascular: Negative.    Gastrointestinal: Negative.    Genitourinary: Negative.  Negative for dysuria, frequency, menstrual problem, pelvic pain, urgency and vaginal discharge.   Skin: Negative.    Neurological: Negative.    Psychiatric/Behavioral: Negative.        Past Medical History:   Diagnosis Date   . Heart abnormality     chf?  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 53   . Mental disorder     anxiety/depression       Past Surgical History:   Procedure Laterality Date   . APPENDECTOMY         Family History   Problem Relation Age of Onset   . Diabetes Paternal Grandfather    . Diabetes Paternal Grandmother    . Diabetes Maternal Grandmother    . Diabetes Paternal Aunt        Social History     Socioeconomic History   . Marital status: Single     Spouse name: Not on file   . Number of children: Not on file   . Years of education: Not on file   . Highest education level: Not on file   Occupational History   . Not on file   Tobacco Use   . Smoking status: Light Tobacco Smoker     Types: Cigarettes   . Smokeless tobacco: Never Used   Vaping Use   . Vaping Use: Never used   Substance and Sexual Activity   . Alcohol use: Not Currently   . Drug use: Never   . Sexual activity: Yes     Partners: Male   Other Topics Concern   . Not on file   Social History Narrative   . Not on file     Social Determinants of Health     Financial Resource Strain:    . Difficulty of Paying Living Expenses: Not on file   Food Insecurity:    . Worried About Programme researcher, broadcasting/film/video in the Last Year: Not on file   . Ran Out of Food  in the Last Year: Not on file   Transportation Needs:    . Lack of Transportation (Medical): Not on file   . Lack of Transportation (Non-Medical): Not on file   Physical Activity:    . Days of Exercise per Week: Not on file   . Minutes of Exercise per Session: Not on file   Stress:    . Feeling of Stress : Not on file   Social Connections:    . Frequency of Communication with Friends and Family: Not on file   . Frequency of Social Gatherings with Friends and Family: Not on file   . Attends Religious Services: Not on file   . Active Member of Clubs or Organizations: Not on file   . Attends Banker Meetings: Not on file   . Marital Status: Not on file   Intimate Partner Violence:    . Fear of Current or Ex-Partner: Not on  file   . Emotionally Abused: Not on file   . Physically Abused: Not on file   . Sexually Abused: Not on file   Housing Stability:    . Unable to Pay for Housing in the Last Year: Not on file   . Number of Places Lived in the Last Year: Not on file   . Unstable Housing in the Last Year: Not on file         Current Outpatient Medications:   .  norethindrone (ORTHO MICRONOR) 0.35 MG tablet, Take 1 tablet by mouth daily, Disp: 28 tablet, Rfl: 11  .  busPIRone (BUSPAR) 10 MG tablet, Take 10 mg by mouth 2 times daily, Disp: , Rfl:     No Known Allergies    BP 116/82   Ht 5\' 7"  (1.702 m)   Wt 189 lb (85.7 kg)   LMP 10/06/2020   BMI 29.60 kg/m   Physical Exam  Constitutional:       General: She is not in acute distress.     Appearance: She is well-developed. She is not diaphoretic.   HENT:      Head: Normocephalic and atraumatic. Hair is normal.      Right Ear: Hearing and external ear normal. No decreased hearing noted.      Left Ear: Hearing and external ear normal. No decreased hearing noted.      Nose: Nose normal. No rhinorrhea.      Mouth/Throat:      Dentition: Normal dentition.   Eyes:      General:         Right eye: No discharge.         Left eye: No discharge.       Conjunctiva/sclera: Conjunctivae normal.      Pupils: Pupils are equal, round, and reactive to light.   Pulmonary:      Effort: Pulmonary effort is normal. No respiratory distress.   Musculoskeletal:         General: No deformity. Normal range of motion.      Cervical back: Normal range of motion.   Skin:     General: Skin is warm and dry.      Coloration: Skin is not pale.      Findings: No rash.   Neurological:      Mental Status: She is alert and oriented to person, place, and time.      Cranial Nerves: No cranial nerve deficit.   Psychiatric:         Speech: Speech normal.         Behavior: Behavior normal.         Thought Content: Thought content normal.         Judgment: Judgment normal.               Assessment   Diagnosis Orders   1. Encounter for Nexplanon removal  Remove drug implant device   2. BCP (birth control pills) initiation  norethindrone (ORTHO MICRONOR) 0.35 MG tablet       Plan     1. Nexplanon removed.   2. Pt wishes to start a OCP, micronor due to to migraines.  Initiation instructions provided.  Risks, benefits and side effects reviewed.      Nexplanon removal procedure:  Area cleansed with chlorhexadine, 3 cc of Lidocaine 1 injected.  A stab incision was made with a scalpel.  Device was grasped.  Adhesions were lysed with scalpel until the device was freed and  removed.  Steri strip was placed and armed wrapped.  Patient tolerated procedure well and is in stable condition.     I Garfield Cornea, am scribing for and in the presence of Dr. Suzzanne Cloud.   I, Dr. Suzzanne Cloud, personally performed the services described in this documentation as scribed by Garfield Cornea in my presence, and it is both accurate and complete.

## 2020-10-06 NOTE — Patient Instructions (Signed)
Patient Education        Combination Birth Control Pills: Care Instructions  Overview     Combination birth control pills are used to prevent pregnancy. They give you a regular dose of the hormones estrogen and progestin.  You take a pill every day to prevent pregnancy.  Birth control pills come in packs. The most common type has 3 weeks of hormone pills. Some packs have sugar pills (they do not contain any hormones) for the fourth week. During that fourth no-hormone week, you have your period. After the fourth week (28 days), you start a new pack.  Some birth control pills are packaged in different ways. For example, some have hormone pills for the fourth week instead of sugar pills. This is called continuous use. Taking hormones for the entire month causes you to not have periods or to have fewer periods. Others are packaged so that you have a period every 3 months. Your doctor will tell you what type of pills you have.  Follow-up care is a key part of your treatment and safety. Be sure to make and go to all appointments, and call your doctor if you are having problems. It's also a good idea to know your test results and keep a list of the medicines you take.  How can you care for yourself at home?  How do you take the pill?   Follow your doctor's instructions about when to start taking your pills. Use backup birth control, such as a condom, or don't have intercourse for 7 days after you start your pills.   Take your pills every day, at about the same time of day. To help yourself do this, try to take them when you do something else every day, such as brushing your teeth.   You can use the pill continuously and skip your period. When you get to the week that you take hormone-free pills, skip those pills and instead start right away on your next pill pack. Continue to take your pill every day. Talk to your doctor if you have any questions.  What if you forget to take a pill?  Always read the label for specific  instructions, or call your doctor. Here are some basic guidelines:   If you miss 1 hormone pill, take it as soon as you remember. Ask your doctor if you may need to use a backup birth control method, such as a condom, or not have intercourse.   If you miss 2 or more hormone pills, take one as soon as you remember you forgot them. Then read the pill label or call your doctor about instructions on how to take your missed pills. Use a backup method of birth control or don't have intercourse for 7 days. Pregnancy is more likely if you miss more than 1 pill.   If you had intercourse, you can use emergency contraception to help prevent pregnancy. The most effective emergency contraception is an IUD (inserted by a doctor). You can also get emergency contraceptive pills. You can get them with a prescription from your doctor or without a prescription at most drugstores.  What else do you need to know?   The pill can have side effects.  ? You may have very light or skipped periods.  ? You may have bleeding between periods (spotting). This usually decreases after 3 to 4 months. If you're using the pill continuously, you won't have periods. But you may still have breakthrough bleeding. Talk to your doctor  if you have problems with breakthrough bleeding. Even if you have this bleeding, the pill should still work well.  ? You may have mood changes, less interest in sex, or weight gain.   The pill may reduce acne, heavy bleeding and cramping, and symptoms of premenstrual syndrome.   Check with your doctor before you use any other medicines, including over-the-counter medicines, vitamins, herbal products, and supplements. Birth control hormones may not work as well to prevent pregnancy when combined with other medicines.   The pill doesn't protect against sexually transmitted infections (STIs), such as herpes or HIV/AIDS. If you're not sure whether your sex partner(s) might have an STI, use a condom to help protect against  disease.  When should you call for help?   Call your doctor now or seek immediate medical care if:    You have severe belly pain.     You have signs of a blood clot, such as:  ? Pain in your calf, back of the knee, thigh, or groin.  ? Redness and swelling in your leg or groin.     You have blurred vision or other problems seeing.     You have a severe headache.     You have severe trouble breathing.   Watch closely for changes in your health, and be sure to contact your doctor if:    You think you might be pregnant.     You think you may be depressed.     You think you may have been exposed to or have a sexually transmitted infection.   Where can you learn more?  Go to [email or web address] and sign in to your MyChart account. Enter [phone] in the Search Health Information box to learn more about "Combination Birth Control Pills: Care Instructions."     If you do not have an account, please click on the "Sign Up Now" link.  Current as of: February 11, 2021Content Version: 13.1   2006-2021 [employer].   Care instructions adapted under license by [facility]. If you have questions about a medical condition or this instruction, always ask your healthcare professional. [employer], Inc[employer] disclaims any warranty or liability for your use of this information.

## 2020-11-24 ENCOUNTER — Encounter

## 2020-11-24 NOTE — Telephone Encounter (Signed)
From: [name]  To: Dr. Nicolette Bang  Sent: 11/24/2020 1:17 PM CDT  Subject: Having some issues    Hey so I'm having some really bad bleeding going on like it's really bad it's super heavy and its never been like that before and having really bad stomach pains, it's never hurt this bad before. So idk if I need to schedule a pap smear or what

## 2020-12-01 ENCOUNTER — Ambulatory Visit: Payer: MEDICAID | Primary: Family

## 2020-12-03 ENCOUNTER — Ambulatory Visit: Payer: MEDICAID | Primary: Family

## 2020-12-08 ENCOUNTER — Inpatient Hospital Stay: Admit: 2020-12-08 | Payer: MEDICAID | Primary: Family

## 2020-12-08 DIAGNOSIS — N921 Excessive and frequent menstruation with irregular cycle: Secondary | ICD-10-CM

## 2021-01-01 NOTE — Telephone Encounter (Signed)
LM that Dr. Robina Ade recommends a Diagnostic Laparoscopy, will call back to discuss.

## 2021-01-01 NOTE — Telephone Encounter (Signed)
From: [name]  Sent: 01/01/2021 1:11 PM CDT  To: [facility]  Subject: Stomach    Hey sorry I missed your call, but yes I will go with the recommendation

## 2021-01-05 ENCOUNTER — Telehealth

## 2021-01-05 NOTE — Telephone Encounter (Signed)
Call transferred from pre service. Pt has appt for preop for Dx lap. She is having vaginal spotting and cramping. Took a home pregnancy test and it was positive. HCG ordered and pt request it to be faxed to [facility].

## 2021-01-06 ENCOUNTER — Observation Stay
Admit: 2021-01-06 | Discharge: 2021-01-07 | Disposition: A | Discharge status: Home / Self Care | Payer: MEDICAID | Admitting: Obstetrics & Gynecology

## 2021-01-06 DIAGNOSIS — O009 Unspecified ectopic pregnancy without intrauterine pregnancy: Secondary | ICD-10-CM

## 2021-01-06 LAB — COMPREHENSIVE METABOLIC PANEL
ALT: 14 U/L (ref 5–33)
AST: 17 U/L (ref 5–32)
Albumin: 4.4 g/dL (ref 3.5–5.2)
Alkaline Phosphatase: 62 U/L (ref 35–104)
Anion Gap: 13 mmol/L (ref 7–19)
BUN: 7 mg/dL (ref 6–20)
CO2: 22 mmol/L (ref 22–29)
Calcium: 8.9 mg/dL (ref 8.6–10.0)
Chloride: 105 mmol/L (ref 98–111)
Creatinine: 0.5 mg/dL (ref 0.5–0.9)
GFR African American: >59 (ref >59)
GFR Non-African American: >60 (ref >60)
Glucose: 99 mg/dL (ref 74–109)
Potassium: 4.3 mmol/L (ref 3.5–5.0)
Sodium: 140 mmol/L (ref 136–145)
Total Bilirubin: 0.3 mg/dL (ref 0.2–1.2)
Total Protein: 7 g/dL (ref 6.6–8.7)

## 2021-01-06 LAB — CBC WITH AUTO DIFFERENTIAL
Basophils %: 0.5 % (ref 0.0–1.0)
Basophils Absolute: 0.1 10*3/uL (ref 0.00–0.20)
Eosinophils %: 0.8 % (ref 0.0–5.0)
Eosinophils Absolute: 0.1 10*3/uL (ref 0.00–0.60)
Hematocrit: 38.5 % (ref 37.0–47.0)
Hemoglobin: 13 g/dL (ref 12.0–16.0)
Immature Granulocytes #: 0 10*3/uL
Lymphocytes %: 14.6 % — ABNORMAL LOW (ref 20.0–40.0)
Lymphocytes Absolute: 1.6 10*3/uL (ref 1.1–4.5)
MCH: 30.8 pg (ref 27.0–31.0)
MCHC: 33.8 g/dL (ref 33.0–37.0)
MCV: 91.2 fL (ref 81.0–99.0)
MPV: 10.7 fL (ref 9.4–12.3)
Monocytes %: 4.9 % (ref 0.0–10.0)
Monocytes Absolute: 0.5 10*3/uL (ref 0.00–0.90)
Neutrophils %: 78.9 % — ABNORMAL HIGH (ref 50.0–65.0)
Neutrophils Absolute: 8.4 10*3/uL — ABNORMAL HIGH (ref 1.5–7.5)
Platelets: 275 10*3/uL (ref 130–400)
RBC: 4.22 M/uL (ref 4.20–5.40)
RDW: 12.1 % (ref 11.5–14.5)
WBC: 10.6 10*3/uL (ref 4.8–10.8)

## 2021-01-06 LAB — TYPE AND SCREEN
ABO/Rh: O POS
Antibody Screen: NEGATIVE

## 2021-01-06 LAB — SPECIMEN REJECTION

## 2021-01-06 MED ORDER — SODIUM CHLORIDE 0.9 % IV SOLN
0.9 | INTRAVENOUS | Status: DC
Start: 2021-01-06 — End: 2021-01-07
  Administered 2021-01-06 – 2021-01-07 (×2): via INTRAVENOUS

## 2021-01-06 MED ORDER — ONDANSETRON HCL 4 MG/2ML IJ SOLN
4 MG/2ML | Freq: Four times a day (QID) | INTRAMUSCULAR | Status: DC | PRN
Start: 2021-01-06 — End: 2021-01-07

## 2021-01-06 MED ORDER — SODIUM CHLORIDE 0.9 % IV SOLN
0.9 % | INTRAVENOUS | Status: DC | PRN
Start: 2021-01-06 — End: 2021-01-07

## 2021-01-06 MED ORDER — ACETAMINOPHEN 325 MG PO TABS
325 | ORAL | Status: DC | PRN
Start: 2021-01-06 — End: 2021-01-07

## 2021-01-06 MED ORDER — ONDANSETRON 4 MG PO TBDP
4 MG | Freq: Three times a day (TID) | ORAL | Status: DC | PRN
Start: 2021-01-06 — End: 2021-01-07

## 2021-01-06 MED ORDER — NORMAL SALINE FLUSH 0.9 % IV SOLN
0.9 | INTRAVENOUS | Status: DC | PRN
Start: 2021-01-06 — End: 2021-01-07

## 2021-01-06 MED ORDER — NORMAL SALINE FLUSH 0.9 % IV SOLN
0.9 % | Freq: Two times a day (BID) | INTRAVENOUS | Status: DC
Start: 2021-01-06 — End: 2021-01-07

## 2021-01-06 NOTE — ED Triage Notes (Signed)
Patient transfer from [facility] for concerns related to early pregnancy.  Patient c/o right sided abdominal pain and vaginal bleeding that began 5/1.  Positive pregnancy test on 3/28.

## 2021-01-06 NOTE — ED Notes (Signed)
Chemistry redraws sent.     Melissa Swaziland, RN  01/06/21 1758

## 2021-01-06 NOTE — Telephone Encounter (Signed)
Dr. Clovis Riley from [facility] called and states the patient came to their department for pelvic pain.  He states the ultrasound does not indicate a gestational sac but does indicate a mass in the uterus, questionable ectopic pregnancy.  Per Dr. Loreta Ave, pt is to come to [facility] ER to be evaluated and treated.  He said she will get a ride here.

## 2021-01-06 NOTE — Progress Notes (Shared)
4 Eyes Skin Assessment    Antonise Lewin is being assessed upon: Admission    I agree that I, Meda Coffee, RN, along with *** (either 2 RN's or 1 LPN and 1 RN) have performed a thorough Head to Toe Skin Assessment on the patient. ALL assessment sites listed below have been assessed.      Areas assessed by both nurses:     [x]    Head, Face, and Ears   [x]    Shoulders, Back, and Chest  [x]    Arms, Elbows, and Hands   [x]    Coccyx, Sacrum, and Ischium  [x]    Legs, Feet, and Heels    Does the Patient have Skin Breakdown? No    Braden Prevention initiated: No  Wound Care Orders initiated: No    WOC nurse consulted for Pressure Injury (Stage 3,4, Unstageable, DTI, NWPT, and Complex wounds) and New or Established Ostomies: NA        Primary Nurse eSignature: Meda Coffee, RN on 01/06/2021 at 6:37 PM      Co-Signer eSignature: {Esignature:[id number]}

## 2021-01-06 NOTE — Progress Notes (Signed)
Janet Bentley arrived to room # 319.   Presented with: ectopic pregnancy  Mental Status: Patient is oriented, alert and coherent.   Vitals:    01/06/21 1833   BP: 119/82   Pulse: 84   Resp: 16   Temp: 97.2 F (36.2 C)   SpO2: 100%     Patient safety contract and falls prevention contract reviewed with patient Yes.  Oriented Patient to room.  Call light within reach. Yes.  Needs, issues or concerns expressed at this time: no.      Electronically signed by Meda Coffee, RN on 01/06/2021 at 6:37 PM

## 2021-01-06 NOTE — ED Notes (Signed)
Report called to Shanda Bumps, RN on 3rd floor.     Melissa Swazil[place], RN  01/06/21 [phone]

## 2021-01-06 NOTE — Plan of Care (Signed)
Problem: Discharge Planning  Goal: Discharge to home or other facility with appropriate resources  Outcome: Progressing  Flowsheets (Taken 01/06/2021 1835)  Discharge to home or other facility with appropriate resources:   Identify discharge learning needs (meds, wound care, etc)   Refer to discharge planning if patient needs post-hospital services based on physician order or complex needs related to functional status, cognitive ability or social support system   Arrange for needed discharge resources and transportation as appropriate     Problem: Pain  Goal: Verbalizes/displays adequate comfort level or baseline comfort level  Outcome: Progressing

## 2021-01-06 NOTE — ED Provider Notes (Signed)
[facility] REN/VAS/MED  eMERGENCY dEPARTMENT eNCOUnter      Pt Name: Janet Bentley  MRN: [id number]  Birthdate 11-May-1991  Date of evaluation: 01/06/2021  Provider: Melva Faux Marijo Conception, MD    CHIEF COMPLAINT       Chief Complaint   Patient presents with   ??? Abdominal Pain     since 5/1   ??? Vaginal Bleeding     started 5/1 ended 5/2         HISTORY OF PRESENT ILLNESS   (Location/Symptom, Timing/Onset,Context/Setting, Quality, Duration, Modifying Factors, Severity)  Note limiting factors.   Janet Bentley is a 30 y.o. female who presents to the emergency department for concern of possible ectopic pregnancy.  Patient's last menstrual cycle was the end of March and she has had intermittent abdominal cramping and spotting over the past 2 weeks but had increased bleeding the past 2 days which has since stopped and now only having right lower quadrant and suprapubic pain.  She is a G4, P1.  She presented to the [facility] ER which performed a ultrasound which shows a small rounded thick cystic structure next to the right ovary however no sizes are stated.  Both ovaries have flow and no obvious masses and they also report no gestational sac or evidence of pregnancy in the endometrial canal.  No yolk sac or heartbeat identified.  I spoke with Dr. Clovis Riley who had contacted Dr. Loreta Ave and she was sent here for further evaluation.  She is followed by Dr. Braulio Bosch.    HPI    NursingNotes were reviewed.    REVIEW OF SYSTEMS    (2-9 systems for level 4, 10 or more for level 5)     Review of Systems   Constitutional: Negative for chills and fever.   Respiratory: Negative for cough and shortness of breath.    Cardiovascular: Negative for chest pain and leg swelling.   Gastrointestinal: Positive for abdominal pain. Negative for diarrhea, nausea and vomiting.   Genitourinary: Positive for vaginal bleeding. Negative for dysuria, frequency, urgency and vaginal discharge.   Musculoskeletal: Negative for back pain and neck pain.   Neurological:  Negative for dizziness and headaches.   All other systems reviewed and are negative.           PAST MEDICALHISTORY     Past Medical History:   Diagnosis Date   ??? Heart abnormality     chf?  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 10   ??? Mental disorder     anxiety/depression         SURGICAL HISTORY       Past Surgical History:   Procedure Laterality Date   ??? APPENDECTOMY           CURRENT MEDICATIONS     Current Discharge Medication List      CONTINUE these medications which have NOT CHANGED    Details   norethindrone (ORTHO MICRONOR) 0.35 MG tablet Take 1 tablet by mouth daily  Qty: 28 tablet, Refills: 11    Associated Diagnoses: BCP (birth control pills) initiation      busPIRone (BUSPAR) 10 MG tablet Take 10 mg by mouth 2 times daily             ALLERGIES     Patient has no known allergies.    FAMILY HISTORY       Family History   Problem Relation Age of Onset   ??? Diabetes Paternal Grandfather    ??? Diabetes Paternal Grandmother    ???  Diabetes Maternal Grandmother    ??? Diabetes Paternal Aunt           SOCIAL HISTORY       Social History     Socioeconomic History   ??? Marital status: Single     Spouse name: None   ??? Number of children: None   ??? Years of education: None   ??? Highest education level: None   Occupational History   ??? None   Tobacco Use   ??? Smoking status: Former Smoker     Types: Cigarettes     Quit date: 12/07/2020     Years since quitting: 0.0   ??? Smokeless tobacco: Never Used   Vaping Use   ??? Vaping Use: Never used   Substance and Sexual Activity   ??? Alcohol use: Not Currently   ??? Drug use: Never   ??? Sexual activity: Yes     Partners: Male   Other Topics Concern   ??? None   Social History Narrative   ??? None     Social Determinants of Health     Financial Resource Strain:    ??? Difficulty of Paying Living Expenses: Not on file   Food Insecurity:    ??? Worried About Programme researcher, broadcasting/film/video in the Last Year: Not on file   ??? Ran Out of Food in the Last Year: Not on file   Transportation Needs:    ??? Lack of  Transportation (Medical): Not on file   ??? Lack of Transportation (Non-Medical): Not on file   Physical Activity:    ??? Days of Exercise per Week: Not on file   ??? Minutes of Exercise per Session: Not on file   Stress:    ??? Feeling of Stress : Not on file   Social Connections:    ??? Frequency of Communication with Friends and Family: Not on file   ??? Frequency of Social Gatherings with Friends and Family: Not on file   ??? Attends Religious Services: Not on file   ??? Active Member of Clubs or Organizations: Not on file   ??? Attends Banker Meetings: Not on file   ??? Marital Status: Not on file   Intimate Partner Violence:    ??? Fear of Current or Ex-Partner: Not on file   ??? Emotionally Abused: Not on file   ??? Physically Abused: Not on file   ??? Sexually Abused: Not on file   Housing Stability:    ??? Unable to Pay for Housing in the Last Year: Not on file   ??? Number of Places Lived in the Last Year: Not on file   ??? Unstable Housing in the Last Year: Not on file       SCREENINGS    Glasgow Coma Scale  Eye Opening: Spontaneous  Best Verbal Response: Oriented  Best Motor Response: Obeys commands  Glasgow Coma Scale Score: 15        PHYSICAL EXAM    (up to 7 for level 4, 8 or more for level 5)     ED Triage Vitals [01/06/21 1611]   BP Temp Temp Source Pulse Resp SpO2 Height Weight   (!) 148/83 98 ??F (36.7 ??C) Oral 92 17 96 % 5\' 9"  (1.753 m) 190 lb (86.2 kg)       Physical Exam  Vitals and nursing note reviewed.   Constitutional:       General: She is not in acute distress.     Appearance: She  is well-developed. She is not ill-appearing or diaphoretic.      Comments: conversive in nad   HENT:      Head: Normocephalic and atraumatic.      Right Ear: External ear normal.      Left Ear: External ear normal.      Nose: Nose normal.      Mouth/Throat:      Mouth: Mucous membranes are moist.   Eyes:      Conjunctiva/sclera: Conjunctivae normal.   Neck:      Trachea: No tracheal deviation.   Cardiovascular:      Rate and Rhythm:  Normal rate and regular rhythm.      Pulses: Normal pulses.      Heart sounds: Normal heart sounds. No murmur heard.      Pulmonary:      Effort: Pulmonary effort is normal. No respiratory distress.      Breath sounds: Normal breath sounds. No wheezing or rales.   Abdominal:      Palpations: Abdomen is soft. There is no mass.      Tenderness: There is abdominal tenderness in the right lower quadrant and suprapubic area. There is no right CVA tenderness or left CVA tenderness.   Musculoskeletal:         General: Normal range of motion.      Cervical back: Normal range of motion.   Skin:     General: Skin is warm and dry.   Neurological:      Mental Status: She is alert and oriented to person, place, and time.      GCS: GCS eye subscore is 4. GCS verbal subscore is 5. GCS motor subscore is 6.         DIAGNOSTIC RESULTS         No orders to display           LABS:  Labs Reviewed   CBC WITH AUTO DIFFERENTIAL - Abnormal; Notable for the following components:       Result Value    Neutrophils % 78.9 (*)     Lymphocytes % 14.6 (*)     Neutrophils Absolute 8.4 (*)     All other components within normal limits   SPECIMEN REJECTION   COMPREHENSIVE METABOLIC PANEL   HCG, QUANTITATIVE, PREGNANCY   URINALYSIS WITH REFLEX TO CULTURE   TYPE AND SCREEN       All other labs were within normal range or not returned as of this dictation.    EMERGENCY DEPARTMENT COURSE and DIFFERENTIAL DIAGNOSIS/MDM:   Vitals:    Vitals:    01/06/21 1611 01/06/21 1833   BP: (!) 148/83 119/82   Pulse: 92 84   Resp: 17 16   Temp: 98 ??F (36.7 ??C) 97.2 ??F (36.2 ??C)   TempSrc: Oral Temporal   SpO2: 96% 100%   Weight: 190 lb (86.2 kg)    Height: 5\' 9"  (1.753 m)        MDM  Number of Diagnoses or Management Options     Amount and/or Complexity of Data Reviewed  Clinical lab tests: ordered and reviewed  Tests in the radiology section of CPT??: reviewed        Discussed the case with Dr. Loreta AveWagner she is considering methotrexate versus exploratory.  Plans for  repeat quantitative hCG in the morning as it was 3200 at the outside hospital.  Patient is in no obvious distress and well-appearing here.  Have placed admission orders.    CONSULTS:  None    PROCEDURES:  Unless otherwise noted below, none     Procedures    FINAL IMPRESSION      1. Right tubal pregnancy without intrauterine pregnancy          DISPOSITION/PLAN   DISPOSITION Admitted 01/06/2021 05:03:56 PM      PATIENT REFERRED TO:  No follow-up provider specified.    DISCHARGE MEDICATIONS:  Current Discharge Medication List             (Please note that portions of this note were completed with a voice recognition program.  Efforts were made to edit thedictations but occasionally words are mis-transcribed.)    Chaselynn Kepple Marijo Conception, MD (electronically signed)  Attending Emergency Physician        Esmond Harps, MD  01/06/21 2136

## 2021-01-07 ENCOUNTER — Observation Stay: Admit: 2021-01-07 | Payer: MEDICAID | Primary: Family

## 2021-01-07 LAB — MICROSCOPIC URINALYSIS

## 2021-01-07 LAB — URINALYSIS WITH REFLEX TO CULTURE
Bilirubin Urine: NEGATIVE
Blood, Urine: NEGATIVE
Glucose, Ur: NEGATIVE mg/dL
Nitrite, Urine: NEGATIVE
Protein, UA: NEGATIVE mg/dL
Specific Gravity, UA: 1.026 (ref 1.005–1.030)
Urobilinogen, Urine: 1 E.U./dL (ref <2.0)
pH, UA: 6 (ref 5.0–8.0)

## 2021-01-07 LAB — HCG, QUANTITATIVE, PREGNANCY
hCG Quant: 3459 m[IU]/mL — ABNORMAL HIGH (ref 0.0–5.3)
hCG Quant: 3983 m[IU]/mL — ABNORMAL HIGH (ref 0.0–5.3)

## 2021-01-07 MED ORDER — ACETAMINOPHEN 500 MG PO TABS
500 MG | Freq: Once | ORAL | Status: AC
Start: 2021-01-07 — End: 2021-01-06
  Administered 2021-01-07: 02:00:00 via ORAL

## 2021-01-07 MED ORDER — IBUPROFEN 800 MG PO TABS
800 MG | ORAL_TABLET | Freq: Three times a day (TID) | ORAL | 0 refills | Status: AC | PRN
Start: 2021-01-07 — End: ?

## 2021-01-07 MED ORDER — ONDANSETRON 4 MG PO TBDP
4 MG | ORAL_TABLET | Freq: Three times a day (TID) | ORAL | 1 refills | Status: DC | PRN
Start: 2021-01-07 — End: 2021-01-14

## 2021-01-07 MED ORDER — HYDROCODONE-ACETAMINOPHEN 5-325 MG PO TABS
5-325 MG | ORAL_TABLET | Freq: Four times a day (QID) | ORAL | 0 refills | Status: AC | PRN
Start: 2021-01-07 — End: 2021-01-10

## 2021-01-07 MED ORDER — METHOTREXATE SODIUM 50 MG/2ML IJ SOLN
50 MG/2ML | INTRAMUSCULAR | Status: AC
Start: 2021-01-07 — End: 2021-01-07
  Administered 2021-01-07: 18:00:00 via INTRAMUSCULAR

## 2021-01-07 MED FILL — TYLENOL EXTRA STRENGTH 500 MG PO TABS: 500 mg | ORAL | Qty: 2

## 2021-01-07 MED FILL — METHOTREXATE SODIUM (PF) 50 MG/2ML IJ SOLN: 50 MG/2ML | INTRAMUSCULAR | Qty: 4

## 2021-01-07 NOTE — Discharge Summary (Signed)
[facility]  Discharge Summary  Patient ID:  Janet Bentley  [id number]  30 y.o.  12-Feb-1991    Admit date: 01/06/2021        Admitting Physician: Farrel Gobble, MD         Admission Diagnoses: Ectopic pregnancy without intrauterine pregnancy [O00.90]  Right tubal pregnancy without intrauterine pregnancy [O00.101]     HPI: pt sent from outlying facility for ectopic pregnancy and management/pain control. Had normal period as expected 12/31/2020 while on her birth control pills. Hx of SAB so felt like with her cramping that seemed bit unusual so took home pregnancy test and it was positive. She thought having a miscarriage when went to local ER.     Discharge Diagnoses: Right ectopic pregnancy    Admission Condition: good    Discharged Condition: good    Indication for Admission: ectopic pregnancy, pain control, expectant verses surgical intervention    Hospital Course: unremarkable, will receive Methotrexate for right ectopic pregnancy prior to discharge. Has been NPO in case of surgery.         Significant Diagnostic Studies: radiology: Ultrasound:   Narrative   EXAMINATION: US OB TRANSVAGINAL 01/07/2021 12:30 PM   HISTORY: Pelvic pain, positive pregnancy status, conical concern for   ectopic pregnancy.    Report: Transvaginal pelvic sonography was performed.   COMPARISON: Transvaginal pelvic ultrasound 12/08/2020.   The uterus measures approximately 6.1 x 4.0 cm and contains a small   focus of endometrial fluid. Endometrial thickness equals 7.3 mm. No   IUP is identified. The left ovary measures 1.9 x 1.1 x 1.6 cm and is   unremarkable. Color Doppler images demonstrate arterial flow within   the left ovary. The right adnexa, there is a thick-walled cystic   structure that measures approximately 1.9 x 1.4 x 1.3 cm. This appears   external to the right ovary, ectopic pregnancy cannot be excluded.   Clinical correlation is recommended. Color Doppler images demonstrate   arterial flow within the right ovary. No  free fluid is identified.   ??   Impression   1. There is a target shaped thick-walled cystic structure in the right   adnexa which measures 1.9 x 1.4 x 1.3 cm, ectopic pregnancy cannot be   excluded with this appearance. Clinical correlation is recommended.   2. Small amount of free fluid within the endometrial cavity. No   significant endometrial thickening.   The ordering practitioner's office was notified of the results   immediately after examination was completed.   Signed by Dr Quita Skye. [place]   ??       Treatments: methotrexate      Physical Exam  Constitutional:       General: She is in acute distress.      Appearance: Normal appearance. She is normal weight.   HENT:      Head: Normocephalic.      Nose: Nose normal.      Mouth/Throat:      Mouth: Mucous membranes are moist.      Pharynx: Oropharynx is clear.   Eyes:      Conjunctiva/sclera: Conjunctivae normal.      Pupils: Pupils are equal, round, and reactive to light.   Cardiovascular:      Rate and Rhythm: Normal rate and regular rhythm.      Pulses: Normal pulses.      Heart sounds: Normal heart sounds.   Pulmonary:      Effort: Pulmonary effort is  normal.      Breath sounds: Normal breath sounds.   Abdominal:      Palpations: Abdomen is soft.      Tenderness: There is abdominal tenderness (5/10 with palpation).   Musculoskeletal:         General: Normal range of motion.      Cervical back: Normal range of motion.   Skin:     General: Skin is warm and dry.      Capillary Refill: Capillary refill takes less than 2 seconds.   Neurological:      General: No focal deficit present.      Mental Status: She is alert and oriented to person, place, and time.   Psychiatric:         Mood and Affect: Mood normal.       Physical Exam  Constitutional:       General: She is in acute distress.      Appearance: Normal appearance. She is normal weight.   HENT:      Head: Normocephalic.      Nose: Nose normal.      Mouth/Throat:      Mouth: Mucous membranes are moist.       Pharynx: Oropharynx is clear.   Eyes:      Conjunctiva/sclera: Conjunctivae normal.      Pupils: Pupils are equal, round, and reactive to light.   Cardiovascular:      Rate and Rhythm: Normal rate and regular rhythm.      Pulses: Normal pulses.      Heart sounds: Normal heart sounds.   Pulmonary:      Effort: Pulmonary effort is normal.      Breath sounds: Normal breath sounds.   Abdominal:      Palpations: Abdomen is soft.      Tenderness: There is abdominal tenderness (5/10 with palpation).   Musculoskeletal:         General: Normal range of motion.      Cervical back: Normal range of motion.   Neurological:      General: No focal deficit present.      Mental Status: She is alert and oriented to person, place, and time.   Skin:     General: Skin is warm and dry.      Capillary Refill: Capillary refill takes less than 2 seconds.   Psychiatric:         Mood and Affect: Mood normal.         Disposition: home    In process/preliminary results:  Outstanding Order Results     No orders found for last 30 day(s).          Patient Instructions:   Current Discharge Medication List      START taking these medications    Details   ondansetron (ZOFRAN-ODT) 4 MG disintegrating tablet Take 1 tablet by mouth every 8 hours as needed for Nausea or Vomiting  Qty: 30 tablet, Refills: 1      HYDROcodone-acetaminophen (NORCO) 5-325 MG per tablet Take 1 tablet by mouth every 6 hours as needed for Pain for up to 3 days. Intended supply: 3 days. Take lowest dose possible to manage pain  Qty: 12 tablet, Refills: 0    Comments: Reduce doses taken as pain becomes manageable  Associated Diagnoses: Ovarian pregnancy without intrauterine pregnancy, unspecified laterality      ibuprofen (ADVIL;MOTRIN) 800 MG tablet Take 1 tablet by mouth every 8 hours as needed for Pain  Qty:  90 tablet, Refills: 0         CONTINUE these medications which have NOT CHANGED    Details   busPIRone (BUSPAR) 10 MG tablet Take 10 mg by mouth 2 times daily          STOP taking these medications       norethindrone (ORTHO MICRONOR) 0.35 MG tablet Comments:   Reason for Stopping:             Activity: activity as tolerated  Diet: regular diet    Follow up labs on Monday at outpatient [place] lab    Follow-up with me Wednesday 01/14/2021  Or sooner if pain increases. Consider ER for severe pain.   Signed:  Roseanna Rainbow, APRN  Dr Loreta Ave aware of findings and plan of care    01/07/2021  12:47 PM  Over 50% of the total visit time of 40 minutes was spent on counseling and/or coordination of care and discharge. All questions were answered and the patient voiced understanding.

## 2021-01-07 NOTE — Plan of Care (Signed)
Problem: Discharge Planning  Goal: Discharge to home or other facility with appropriate resources  01/07/2021 0116 by Anselm Pancoast, RN  Outcome: Progressing  Flowsheets (Taken 01/06/2021 2217)  Discharge to home or other facility with appropriate resources: Identify barriers to discharge with patient and caregiver  01/06/2021 1840 by Meda Coffee, RN  Outcome: Progressing  Flowsheets (Taken 01/06/2021 1835)  Discharge to home or other facility with appropriate resources:   Identify discharge learning needs (meds, wound care, etc)   Refer to discharge planning if patient needs post-hospital services based on physician order or complex needs related to functional status, cognitive ability or social support system   Arrange for needed discharge resources and transportation as appropriate     Problem: Pain  Goal: Verbalizes/displays adequate comfort level or baseline comfort level  01/07/2021 0116 by Anselm Pancoast, RN  Outcome: Progressing  01/06/2021 1840 by Meda Coffee, RN  Outcome: Progressing

## 2021-01-07 NOTE — Discharge Instructions (Signed)
Follow up at [facility] (located in [address] [place] [place], [place] on the first floor) on 01/12/21 for follow up lab work prior to appointment on 01/14/2021.

## 2021-01-08 NOTE — Telephone Encounter (Signed)
From: [name]  To: Dr. Nicolette Bang  Sent: 01/05/2021 4:09 PM CDT  Subject: Question    So I started cramping and bleeding really bad the other day and I'm cramping very severe now without bleeding and I took a home pregnancy test and came back positive I don't know if I'm miscarriage or if it's a ectopic pregnancy but the cramps are severe.

## 2021-01-12 ENCOUNTER — Encounter

## 2021-01-12 LAB — COMPREHENSIVE METABOLIC PANEL
ALT: 28 U/L (ref 5–33)
AST: 14 U/L (ref 5–32)
Albumin: 4.5 g/dL (ref 3.5–5.2)
Alkaline Phosphatase: 66 U/L (ref 35–104)
Anion Gap: 16 mmol/L (ref 7–19)
BUN: 11 mg/dL (ref 6–20)
CO2: 20 mmol/L — ABNORMAL LOW (ref 22–29)
Calcium: 9.7 mg/dL (ref 8.6–10.0)
Chloride: 104 mmol/L (ref 98–111)
Creatinine: 0.6 mg/dL (ref 0.5–0.9)
GFR African American: >59 (ref >59)
GFR Non-African American: >60 (ref >60)
Glucose: 101 mg/dL (ref 74–109)
Potassium: 4.4 mmol/L (ref 3.5–5.0)
Sodium: 140 mmol/L (ref 136–145)
Total Bilirubin: <0.2 mg/dL (ref 0.2–1.2)
Total Protein: 7.4 g/dL (ref 6.6–8.7)

## 2021-01-12 LAB — CBC WITH AUTO DIFFERENTIAL
Basophils %: 0.6 % (ref 0.0–1.0)
Basophils Absolute: 0.1 10*3/uL (ref 0.00–0.20)
Eosinophils %: 1.7 % (ref 0.0–5.0)
Eosinophils Absolute: 0.1 10*3/uL (ref 0.00–0.60)
Hematocrit: 39 % (ref 37.0–47.0)
Hemoglobin: 12.7 g/dL (ref 12.0–16.0)
Immature Granulocytes #: 0 10*3/uL
Lymphocytes %: 20.7 % (ref 20.0–40.0)
Lymphocytes Absolute: 1.6 10*3/uL (ref 1.1–4.5)
MCH: 30.7 pg (ref 27.0–31.0)
MCHC: 32.6 g/dL — ABNORMAL LOW (ref 33.0–37.0)
MCV: 94.2 fL (ref 81.0–99.0)
MPV: 10.5 fL (ref 9.4–12.3)
Monocytes %: 5.7 % (ref 0.0–10.0)
Monocytes Absolute: 0.4 10*3/uL (ref 0.00–0.90)
Neutrophils %: 71 % — ABNORMAL HIGH (ref 50.0–65.0)
Neutrophils Absolute: 5.5 10*3/uL (ref 1.5–7.5)
Platelets: 269 10*3/uL (ref 130–400)
RBC: 4.14 M/uL — ABNORMAL LOW (ref 4.20–5.40)
RDW: 11.8 % (ref 11.5–14.5)
WBC: 7.7 10*3/uL (ref 4.8–10.8)

## 2021-01-12 LAB — HCG, QUANTITATIVE, PREGNANCY: hCG Quant: 3543 m[IU]/mL — ABNORMAL HIGH (ref 0.0–5.3)

## 2021-01-14 ENCOUNTER — Encounter

## 2021-01-14 ENCOUNTER — Ambulatory Visit: Admit: 2021-01-14 | Discharge: 2021-01-14 | Payer: MEDICAID | Attending: Family | Primary: Family

## 2021-01-14 DIAGNOSIS — O008 Other ectopic pregnancy without intrauterine pregnancy: Secondary | ICD-10-CM

## 2021-01-14 LAB — CBC WITH AUTO DIFFERENTIAL
Basophils %: 0.4 % (ref 0.0–1.0)
Basophils Absolute: 0 10*3/uL (ref 0.00–0.20)
Eosinophils %: 1.5 % (ref 0.0–5.0)
Eosinophils Absolute: 0.1 10*3/uL (ref 0.00–0.60)
Hematocrit: 39 % (ref 37.0–47.0)
Hemoglobin: 12.7 g/dL (ref 12.0–16.0)
Immature Granulocytes #: 0 10*3/uL
Lymphocytes %: 18.4 % — ABNORMAL LOW (ref 20.0–40.0)
Lymphocytes Absolute: 1.6 10*3/uL (ref 1.1–4.5)
MCH: 30.5 pg (ref 27.0–31.0)
MCHC: 32.6 g/dL — ABNORMAL LOW (ref 33.0–37.0)
MCV: 93.8 fL (ref 81.0–99.0)
MPV: 10.9 fL (ref 9.4–12.3)
Monocytes %: 5.3 % (ref 0.0–10.0)
Monocytes Absolute: 0.5 10*3/uL (ref 0.00–0.90)
Neutrophils %: 73.9 % — ABNORMAL HIGH (ref 50.0–65.0)
Neutrophils Absolute: 6.2 10*3/uL (ref 1.5–7.5)
Platelets: 270 10*3/uL (ref 130–400)
RBC: 4.16 M/uL — ABNORMAL LOW (ref 4.20–5.40)
RDW: 11.9 % (ref 11.5–14.5)
WBC: 8.4 10*3/uL (ref 4.8–10.8)

## 2021-01-14 LAB — COMPREHENSIVE METABOLIC PANEL
ALT: 19 U/L (ref 5–33)
AST: 13 U/L (ref 5–32)
Albumin: 4.4 g/dL (ref 3.5–5.2)
Alkaline Phosphatase: 69 U/L (ref 35–104)
Anion Gap: 13 mmol/L (ref 7–19)
BUN: 11 mg/dL (ref 6–20)
CO2: 23 mmol/L (ref 22–29)
Calcium: 9.6 mg/dL (ref 8.6–10.0)
Chloride: 104 mmol/L (ref 98–111)
Creatinine: 0.6 mg/dL (ref 0.5–0.9)
GFR African American: >59 (ref >59)
GFR Non-African American: >60 (ref >60)
Glucose: 81 mg/dL (ref 74–109)
Potassium: 4.1 mmol/L (ref 3.5–5.0)
Sodium: 140 mmol/L (ref 136–145)
Total Bilirubin: <0.2 mg/dL (ref 0.2–1.2)
Total Protein: 7.5 g/dL (ref 6.6–8.7)

## 2021-01-14 LAB — HCG, QUANTITATIVE, PREGNANCY: hCG Quant: 3142 m[IU]/mL — ABNORMAL HIGH (ref 0.0–5.3)

## 2021-01-14 NOTE — Progress Notes (Signed)
Janet Bentley is a 30 y.o. female who presents today for her medical conditions/ complaints as noted below. Janet Bentley is c/o of Follow-up (ER follow up)        HPI  Patient presents today for ER follow up- ectopic pregnancy. Moods are good. Hasn't done labs yet for day 7 post methotrexate, but doing when leave here today. Minimal to no pain. Is bleeding now, was spotting for most part prior to this am, now more "period" type bleeding  Is not wanting more children, wants to discuss effective birth control, as was on ocp and taking correctly when got pregnant this time    Patient's last menstrual period was 01/14/2021 (exact date).  G2P1000    Past Medical History:   Diagnosis Date   . Heart abnormality     chf?  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 43   . Mental disorder     anxiety/depression     Past Surgical History:   Procedure Laterality Date   . APPENDECTOMY       Family History   Problem Relation Age of Onset   . Diabetes Paternal Grandfather    . Diabetes Paternal Grandmother    . Diabetes Maternal Grandmother    . Diabetes Paternal Aunt      Social History     Tobacco Use   . Smoking status: Former Smoker     Types: Cigarettes     Quit date: 12/07/2020     Years since quitting: 0.1   . Smokeless tobacco: Never Used   Substance Use Topics   . Alcohol use: Not Currently       Current Outpatient Medications   Medication Sig Dispense Refill   . ibuprofen (ADVIL;MOTRIN) 800 MG tablet Take 1 tablet by mouth every 8 hours as needed for Pain 90 tablet 0     No current facility-administered medications for this visit.     No Known Allergies  Vitals:    01/14/21 1105   BP: 106/72   Pulse: 105     Body mass index is 29.09 kg/m.    Review of Systems   Constitutional: Negative.    HENT: Negative.    Eyes: Negative.    Respiratory: Negative.    Cardiovascular: Negative.    Gastrointestinal: Positive for abdominal pain (slight discomfort).   Genitourinary: Positive for vaginal bleeding. Negative for difficulty  urinating, dyspareunia, dysuria, enuresis, frequency, hematuria, menstrual problem, pelvic pain, urgency and vaginal discharge.   Musculoskeletal: Negative.    Skin: Negative.    Neurological: Negative.    Psychiatric/Behavioral: Negative.          Physical Exam  Vitals and nursing note reviewed.   Constitutional:       General: She is not in acute distress.     Appearance: She is well-developed. She is not diaphoretic.   HENT:      Head: Normocephalic and atraumatic.   Eyes:      Conjunctiva/sclera: Conjunctivae normal.      Pupils: Pupils are equal, round, and reactive to light.   Pulmonary:      Effort: Pulmonary effort is normal.   Abdominal:      General: Abdomen is flat. Bowel sounds are normal.      Palpations: Abdomen is soft.      Tenderness: There is no abdominal tenderness (slight, diffuse, with deep palpation). There is no guarding.   Musculoskeletal:         General: Normal range of motion.  Cervical back: Normal range of motion.      Comments: Normal ROM in all 4 extremities; normal gait   Skin:     General: Skin is warm and dry.   Neurological:      Mental Status: She is alert and oriented to person, place, and time.      Motor: No abnormal muscle tone.      Coordination: Coordination normal.   Psychiatric:         Behavior: Behavior normal.          Diagnosis Orders   1. Other ectopic pregnancy without intrauterine pregnancy     2. Encounter for other general counseling and advice on contraception         MEDICATIONS:  No orders of the defined types were placed in this encounter.      ORDERS:  No orders of the defined types were placed in this encounter.      PLAN:  Plan pending labs  Will want to see hcg return to 0. Needs pap/physical and birth control after ectopic resolved.   There are no Patient Instructions on file for this visit.

## 2021-01-14 NOTE — Telephone Encounter (Signed)
From: [name]  To: Jasmine December Dicicco  Sent: 01/14/2021 3:15 PM CDT  Subject: Question regarding HCG QUANTITATIVE PREGNANCY    When do I need come back in for blood work

## 2021-01-15 ENCOUNTER — Encounter

## 2021-01-21 ENCOUNTER — Encounter

## 2021-01-21 LAB — HCG, QUANTITATIVE, PREGNANCY: hCG Quant: 894.4 m[IU]/mL — ABNORMAL HIGH (ref 0.0–5.3)

## 2021-01-28 ENCOUNTER — Encounter

## 2021-01-28 LAB — HCG, QUANTITATIVE, PREGNANCY: hCG Quant: 216.3 m[IU]/mL — ABNORMAL HIGH (ref 0.0–5.3)

## 2021-01-30 ENCOUNTER — Encounter: Attending: Obstetrics & Gynecology | Primary: Family

## 2021-02-03 ENCOUNTER — Encounter

## 2021-02-04 ENCOUNTER — Ambulatory Visit: Admit: 2021-02-04 | Discharge: 2021-02-04 | Payer: MEDICAID | Attending: Family | Primary: Family

## 2021-02-04 ENCOUNTER — Encounter

## 2021-02-04 ENCOUNTER — Inpatient Hospital Stay: Admit: 2021-02-04 | Discharge: 2021-03-01 | Payer: MEDICAID | Primary: Family

## 2021-02-04 DIAGNOSIS — O009 Unspecified ectopic pregnancy without intrauterine pregnancy: Secondary | ICD-10-CM

## 2021-02-04 DIAGNOSIS — O008 Other ectopic pregnancy without intrauterine pregnancy: Secondary | ICD-10-CM

## 2021-02-04 LAB — HCG, QUANTITATIVE, PREGNANCY: hCG Quant: 29.5 m[IU]/mL — ABNORMAL HIGH (ref 0.0–5.3)

## 2021-02-04 NOTE — Progress Notes (Signed)
Janet Bentley is a 30 y.o. female who presents today for her medical conditions/ complaints as noted below. Janet Bentley is c/o of Results (u/s today )        HPI  Pt is here today sent over from the [facility] to go over her u/s results.   Right ectopic and has had methotrexate. This is a follow up US. Her hcg has decreased appropriately. She is having pain to RLQ, she noted mostly w her [place]. Bleeding is about finished.   Did lab after [place].   [place] results pending, preliminary only.     Patient's last menstrual period was 01/14/2021 (exact date).  [id number]    Past Medical History:   Diagnosis Date   . Heart abnormality     chf?  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 39   . Mental disorder     anxiety/depression     Past Surgical History:   Procedure Laterality Date   . APPENDECTOMY       Family History   Problem Relation Age of Onset   . Diabetes Paternal Grandfather    . Diabetes Paternal Grandmother    . Diabetes Maternal Grandmother    . Diabetes Paternal Aunt      Social History     Tobacco Use   . Smoking status: Former Smoker     Types: Cigarettes     Quit date: 12/07/2020     Years since quitting: 0.1   . Smokeless tobacco: Never Used   Substance Use Topics   . Alcohol use: Not Currently       Current Outpatient Medications   Medication Sig Dispense Refill   . ibuprofen (ADVIL;MOTRIN) 800 MG tablet Take 1 tablet by mouth every 8 hours as needed for Pain 90 tablet 0     No current facility-administered medications for this visit.     No Known Allergies  Vitals:    02/04/21 1332   BP: 138/88   Pulse: 99     Body mass index is 29.39 kg/m.    Review of Systems   Constitutional: Negative.    HENT: Negative.    Eyes: Negative.    Respiratory: Negative.    Cardiovascular: Negative.    Gastrointestinal: Positive for abdominal pain.   Genitourinary: Negative for difficulty urinating, dyspareunia, dysuria, enuresis, frequency, hematuria, menstrual problem, pelvic pain, urgency and vaginal discharge.   Musculoskeletal:  Negative.    Skin: Negative.    Neurological: Negative.    Psychiatric/Behavioral: Negative.          Physical Exam  Vitals and nursing note reviewed.   Constitutional:       General: She is not in acute distress.     Appearance: She is well-developed. She is not diaphoretic.   HENT:      Head: Normocephalic and atraumatic.   Eyes:      Conjunctiva/sclera: Conjunctivae normal.      Pupils: Pupils are equal, round, and reactive to light.   Pulmonary:      Effort: Pulmonary effort is normal.   Abdominal:      Tenderness: There is abdominal tenderness in the right lower quadrant. There is no guarding.   Musculoskeletal:         General: Normal range of motion.      Cervical back: Normal range of motion.      Comments: Normal ROM in all 4 extremities; normal gait   Skin:     General: Skin is warm and dry.  Neurological:      Mental Status: She is alert and oriented to person, place, and time.      Motor: No abnormal muscle tone.      Coordination: Coordination normal.   Psychiatric:         Behavior: Behavior normal.          Diagnosis Orders   1. Other ectopic pregnancy without intrauterine pregnancy     2. Encounter to discuss test results     3. RLQ abdominal pain         MEDICATIONS:  No orders of the defined types were placed in this encounter.      ORDERS:  No orders of the defined types were placed in this encounter.      PLAN:  Her hcg has decreased to 29 at this time.   I waited for her [place] results to post, it appears she has a dominant follicle and ? Cyst as well to right side and the area in question is larger than last [place].   I called to discuss with Dr Loreta Ave who is on call and she was ok with her hcg being decreased that methotrexate did it's job. Maybe the other findings causing her pain.   Pt states not severe, warning signs of when to go to ER discussed.   Set her up with appt with Dr Robina Ade for Monday as she couldn't return Thursday.   All her and husband's questions answered.   Over 50% of the total  visit time of 30 minutes was spent on counseling and/or coordination of care. All questions were answered and the patient voiced understanding.    There are no Patient Instructions on file for this visit.

## 2021-02-05 ENCOUNTER — Encounter

## 2021-02-09 ENCOUNTER — Encounter

## 2021-02-09 ENCOUNTER — Ambulatory Visit: Admit: 2021-02-09 | Discharge: 2021-02-09 | Payer: MEDICAID | Attending: Obstetrics & Gynecology | Primary: Family

## 2021-02-09 DIAGNOSIS — O008 Other ectopic pregnancy without intrauterine pregnancy: Secondary | ICD-10-CM

## 2021-02-09 LAB — HCG, QUANTITATIVE, PREGNANCY: hCG Quant: 4.6 m[IU]/mL (ref 0.0–5.3)

## 2021-02-09 NOTE — Progress Notes (Signed)
Janet Bentley is a 30 y.o.  who presents today for a follow up appt for an ectopic pregnancy.   Pt having pain on her right side, no bleeding. 4/10 pain.   Pt is done with childbearing.     Review of Systems   Constitutional: Negative.    HENT: Negative.    Eyes: Negative.    Respiratory: Negative.    Cardiovascular: Negative.    Gastrointestinal: Positive for abdominal pain.   Endocrine: Negative.    Genitourinary: Negative.  Negative for dysuria, frequency, menstrual problem, pelvic pain, urgency and vaginal discharge.   Musculoskeletal: Negative.    Skin: Negative.    Allergic/Immunologic: Negative.    Neurological: Negative.    Hematological: Negative.    Psychiatric/Behavioral: Negative.        Past Medical History:   Diagnosis Date   . Heart abnormality     chf?  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 39   . Mental disorder     anxiety/depression       Past Surgical History:   Procedure Laterality Date   . APPENDECTOMY         Family History   Problem Relation Age of Onset   . Diabetes Paternal Grandfather    . Diabetes Paternal Grandmother    . Diabetes Maternal Grandmother    . Diabetes Paternal Aunt        Social History     Socioeconomic History   . Marital status: Single     Spouse name: Not on file   . Number of children: Not on file   . Years of education: Not on file   . Highest education level: Not on file   Occupational History   . Not on file   Tobacco Use   . Smoking status: Former Smoker     Types: Cigarettes     Quit date: 12/07/2020     Years since quitting: 0.1   . Smokeless tobacco: Never Used   Vaping Use   . Vaping Use: Never used   Substance and Sexual Activity   . Alcohol use: Not Currently   . Drug use: Never   . Sexual activity: Yes     Partners: Male   Other Topics Concern   . Not on file   Social History Narrative   . Not on file     Social Determinants of Health     Financial Resource Strain:    . Difficulty of Paying Living Expenses: Not on file   Food Insecurity:    . Worried About  Programme researcher, broadcasting/film/video in the Last Year: Not on file   . Ran Out of Food in the Last Year: Not on file   Transportation Needs:    . Lack of Transportation (Medical): Not on file   . Lack of Transportation (Non-Medical): Not on file   Physical Activity:    . Days of Exercise per Week: Not on file   . Minutes of Exercise per Session: Not on file   Stress:    . Feeling of Stress : Not on file   Social Connections:    . Frequency of Communication with Friends and Family: Not on file   . Frequency of Social Gatherings with Friends and Family: Not on file   . Attends Religious Services: Not on file   . Active Member of Clubs or Organizations: Not on file   . Attends Banker Meetings: Not on file   . Marital  Status: Not on file   Intimate Partner Violence:    . Fear of Current or Ex-Partner: Not on file   . Emotionally Abused: Not on file   . Physically Abused: Not on file   . Sexually Abused: Not on file   Housing Stability:    . Unable to Pay for Housing in the Last Year: Not on file   . Number of Places Lived in the Last Year: Not on file   . Unstable Housing in the Last Year: Not on file         Current Outpatient Medications:   .  ibuprofen (ADVIL;MOTRIN) 800 MG tablet, Take 1 tablet by mouth every 8 hours as needed for Pain, Disp: 90 tablet, Rfl: 0    No Known Allergies    BP 128/82   Ht 5\' 9"  (1.753 m)   Wt 201 lb (91.2 kg)   LMP 01/14/2021 (Exact Date)   BMI 29.68 kg/m   Physical Exam  Constitutional:       General: She is not in acute distress.     Appearance: She is well-developed. She is not diaphoretic.   HENT:      Head: Normocephalic and atraumatic. Hair is normal.      Right Ear: Hearing and external ear normal. No decreased hearing noted.      Left Ear: Hearing and external ear normal. No decreased hearing noted.      Nose: Nose normal. No rhinorrhea.      Mouth/Throat:      Dentition: Normal dentition.   Eyes:      General:         Right eye: No discharge.         Left eye: No discharge.       Conjunctiva/sclera: Conjunctivae normal.      Pupils: Pupils are equal, round, and reactive to light.   Pulmonary:      Effort: Pulmonary effort is normal. No respiratory distress.   Abdominal:      General: There is no distension.      Palpations: Abdomen is soft.      Tenderness: There is abdominal tenderness (minimal RLQ). There is no guarding or rebound.   Musculoskeletal:         General: No deformity. Normal range of motion.      Cervical back: Normal range of motion.   Skin:     General: Skin is warm and dry.      Coloration: Skin is not pale.      Findings: No rash.   Neurological:      Mental Status: She is alert and oriented to person, place, and time.      Cranial Nerves: No cranial nerve deficit.   Psychiatric:         Mood and Affect: Mood normal.         Speech: Speech normal.         Behavior: Behavior normal.         Thought Content: Thought content normal.         Judgment: Judgment normal.     Results for Janet, Bentley (MRN [id number]) as of 02/09/2021 15:19   Ref. Range 01/12/2021 11:21 01/14/2021 12:04 01/21/2021 09:59 01/28/2021 11:25 02/04/2021 13:29 02/04/2021 14:29   hCG Quant Latest Ref Range: 0.0 - 5.3 mIU/mL 3543.0 (H) 3142.0 (H) 894.4 (H) 216.3 (H)  29.5 (H)     Narrative   NO PRIOR REPORT AVAILABLE   Exam: TRANSVAGINAL ULTRASOUND OF  THE PELVIS    Clinical data: Right-sided pelvic pain. Ultrasound done on 01/07/2021 showed possible ectopic pregnancy with beta HCG level of 06/05/1982; given methotrexate has gone down to 216.3 on 01/28/2021.   Technique: Transvaginal real-time grayscale imaging of the pelvis was performed. Images supplemented with color Doppler technique.   Prior studies: Pelvic US dated 01/07/2021 and CT abdomen/pelvis dated 05/28/2020 (reports unavailable).   Findings: The uterus measures 7.6 x 4.1 x 5.1 cm and is fairly homogeneous in echotexture without evidence of discrete uterine mass. The endometrial stripe measures 0.6 cm. Unremarkable cervix. No evidence of free fluid in the  pelvis.   Heterogeneous area in the right adnexa adjacent to the right ovary measures 3.2 x 2.5 x 2.9 cm (previously measured 1.4 x 1.3 x 1.9 cm), with no definitive flow with color Doppler evaluation. The right ovary measures 5.4 x 4.9 x 3.0 cm; 2 anechoic foci    in the right ovary measure 3.6 x 2.4 x 2.5 cm and 3.3 x 2.3 x 3.2 cm, not visualized on prior, nonspecific. The left ovary measures 2.9 x 1.7 x 1.7 cm; subcentimeter anechoic foci in the ovaries are likely functional follicles.       Impression   1. Heterogeneous area in the red adnexa adjacent to the right ovary measures 3.2 x 2.5 x 2.9 cm, potentially residua from the ectopic pregnancy.    2. Two anechoic foci in the right ovary, not visualized on prior; consider dominant follicles and/or dilated fallopian tube.    3. The uterus is unremarkable, with no evidence of an IUP at this time.    Recommendation: Follow up ultrasound is recommended to ensure involution; consider pelvic exam and follow-up beta HCG levels.    Electronically Signed by Marin Shutter MD at 04-Feb-2021 03:36:25 PM EST                 Assessment   Diagnosis Orders   1. Other ectopic pregnancy without intrauterine pregnancy     2. Encounter to discuss test results     3. Pelvic pain in female         Plan     1. Discussed the decrease of her HCG, did a repeat today. Will notify of results.   2. Ultrasound discussed, told she may still have tissue products, but the pregnancy is not growing.   3. Pt instructed to let us know if pain increases.   I Garfield Cornea, am scribing for and in the presence of Dr. Suzzanne Cloud.   I, Dr. Suzzanne Cloud, personally performed the services described in this documentation as scribed by Garfield Cornea in my presence, and it is both accurate and complete.

## 2021-02-09 NOTE — Progress Notes (Signed)
Pt states she is still having a little bit of pain. She is not having any vaginal bleeding.

## 2021-03-10 NOTE — Telephone Encounter (Signed)
Called PT to set up new pt referral with Dr. Lenise Herald. Pt stated that she already had an appt with another neurology group. So pt will not be needing this referral at this time.

## 2022-06-02 ENCOUNTER — Emergency Department: Admit: 2022-06-02 | Payer: MEDICAID | Primary: Family

## 2022-06-02 ENCOUNTER — Inpatient Hospital Stay
Admit: 2022-06-02 | Discharge: 2022-06-02 | Disposition: A | Discharge status: Home / Self Care | Payer: MEDICAID | Attending: Emergency Medicine

## 2022-06-02 DIAGNOSIS — I4891 Unspecified atrial fibrillation: Secondary | ICD-10-CM

## 2022-06-02 LAB — COMPREHENSIVE METABOLIC PANEL
ALT: 11 U/L (ref 5–33)
AST: 12 U/L (ref 5–32)
Albumin: 4.5 g/dL (ref 3.5–5.2)
Alkaline Phosphatase: 73 U/L (ref 35–104)
Anion Gap: 13 mmol/L (ref 7–19)
BUN: 11 mg/dL (ref 6–20)
CO2: 22 mmol/L (ref 22–29)
Calcium: 9.9 mg/dL (ref 8.6–10.0)
Chloride: 105 mmol/L (ref 98–111)
Creatinine: 0.7 mg/dL (ref 0.5–0.9)
Est, Glom Filt Rate: >60 (ref >60)
Glucose: 183 mg/dL — ABNORMAL HIGH (ref 74–109)
Potassium: 3.7 mmol/L (ref 3.5–5.0)
Sodium: 140 mmol/L (ref 136–145)
Total Bilirubin: 0.4 mg/dL (ref 0.2–1.2)
Total Protein: 8.2 g/dL (ref 6.6–8.7)

## 2022-06-02 LAB — CBC WITH AUTO DIFFERENTIAL
Basophils %: 0.5 % (ref 0.0–1.0)
Basophils Absolute: 0.1 10*3/uL (ref 0.00–0.20)
Eosinophils %: 0.7 % (ref 0.0–5.0)
Eosinophils Absolute: 0.1 10*3/uL (ref 0.00–0.60)
Hematocrit: 43.2 % (ref 37.0–47.0)
Hemoglobin: 14.8 g/dL (ref 12.0–16.0)
Immature Granulocytes #: 0.1 10*3/uL
Lymphocytes %: 11.2 % — ABNORMAL LOW (ref 20.0–40.0)
Lymphocytes Absolute: 1.4 10*3/uL (ref 1.1–4.5)
MCH: 30.3 pg (ref 27.0–31.0)
MCHC: 34.3 g/dL (ref 33.0–37.0)
MCV: 88.5 fL (ref 81.0–99.0)
MPV: 10.5 fL (ref 9.4–12.3)
Monocytes %: 3.9 % (ref 0.0–10.0)
Monocytes Absolute: 0.5 10*3/uL (ref 0.00–0.90)
Neutrophils %: 83.3 % — ABNORMAL HIGH (ref 50.0–65.0)
Neutrophils Absolute: 10.6 10*3/uL — ABNORMAL HIGH (ref 1.5–7.5)
Platelets: 361 10*3/uL (ref 130–400)
RBC: 4.88 M/uL (ref 4.20–5.40)
RDW: 12 % (ref 11.5–14.5)
WBC: 12.7 10*3/uL — ABNORMAL HIGH (ref 4.8–10.8)

## 2022-06-02 LAB — TROPONIN: Troponin: <0.01 ng/mL (ref 0.00–0.03)

## 2022-06-02 LAB — HCG, SERUM, QUALITATIVE: hCG Qual: NEGATIVE

## 2022-06-02 LAB — APTT: aPTT: 29 s (ref 26.0–36.2)

## 2022-06-02 LAB — TSH WITH REFLEX TO FT4: TSH Reflex FT4: 2.98 u[IU]/mL (ref 0.35–5.50)

## 2022-06-02 LAB — MAGNESIUM: Magnesium: 1.9 mg/dL (ref 1.6–2.6)

## 2022-06-02 LAB — COVID-19, RAPID: SARS-CoV-2, NAAT: NOT DETECTED

## 2022-06-02 MED ORDER — METOPROLOL TARTRATE 50 MG PO TABS
50 MG | ORAL_TABLET | Freq: Two times a day (BID) | ORAL | 0 refills | Status: AC
Start: 2022-06-02 — End: 2022-07-22

## 2022-06-02 MED ORDER — HEPARIN SODIUM (PORCINE) 1000 UNIT/ML IJ SOLN
1000 UNIT/ML | INTRAMUSCULAR | Status: DC | PRN
Start: 2022-06-02 — End: 2022-06-02

## 2022-06-02 MED ORDER — SODIUM CHLORIDE 0.9 % IV SOLN
0.9 | INTRAVENOUS | Status: DC
Start: 2022-06-02 — End: 2022-06-02

## 2022-06-02 MED ORDER — METOPROLOL TARTRATE 25 MG PO TABS
25 MG | Freq: Once | ORAL | Status: AC
Start: 2022-06-02 — End: 2022-06-02
  Administered 2022-06-02: 18:00:00 25 mg via ORAL

## 2022-06-02 MED ORDER — DILTIAZEM HCL 25 MG/5ML IV SOLN
25 MG/5ML | Freq: Once | INTRAVENOUS | Status: DC
Start: 2022-06-02 — End: 2022-06-02

## 2022-06-02 MED ORDER — HEPARIN SODIUM (PORCINE) 1000 UNIT/ML IJ SOLN
1000 UNIT/ML | Freq: Once | INTRAMUSCULAR | Status: DC
Start: 2022-06-02 — End: 2022-06-02

## 2022-06-02 MED ORDER — HEPARIN SOD (PORCINE) IN D5W 100 UNIT/ML IV SOLN
100 UNIT/ML | INTRAVENOUS | Status: DC
Start: 2022-06-02 — End: 2022-06-02

## 2022-06-02 MED FILL — METOPROLOL TARTRATE 25 MG PO TABS: 25 MG | ORAL | Qty: 1

## 2022-06-02 MED FILL — HEPARIN SODIUM (PORCINE) 1000 UNIT/ML IJ SOLN: 1000 UNIT/ML | INTRAMUSCULAR | Qty: 10

## 2022-06-02 MED FILL — HEPARIN SOD (PORCINE) IN D5W 100 UNIT/ML IV SOLN: 100 UNIT/ML | INTRAVENOUS | Qty: 250

## 2022-06-02 MED FILL — DILTIAZEM HCL 125 MG/25ML IV SOLN: 125 MG/25ML | INTRAVENOUS | Qty: 25

## 2022-06-02 MED FILL — DILTIAZEM HCL 25 MG/5ML IV SOLN: 25 MG/5ML | INTRAVENOUS | Qty: 5

## 2022-06-02 NOTE — ED Notes (Signed)
Per repeat EKG, patient now in sinus rhythm, ok t discontinue all medications at this time per Dr Earleen Newport.      Romy Ipock, RN  06/02/22 1146

## 2022-06-02 NOTE — ED Notes (Signed)
Paged Dr. Remigio Eisenmenger, Cardiology for Dr. Jaynee Eagles  06/02/22 1325

## 2022-06-02 NOTE — ED Provider Notes (Signed)
[facility] EMERG[facility] DEPT  eMERGENCY dEPARTMENT eNCOUnter      Pt Name: Janet Bentley  MRN: [id number]  Birthdate 1991-07-11  Date of evaluation: 06/02/2022  Provider: Roena Malady, MD    CHIEF COMPLAINT       Chief Complaint   Patient presents with    Palpitations     Pt arrives via ems from home. Reports waking up with a fluttering feeling, "my hearts beating fast". EMS states pt has been as high as 170's on their cardiac monitor. Pt denies any medications or stress.            HISTORY OF PRESENT ILLNESS   (Location/Symptom, Timing/Onset,Context/Setting, Quality, Duration, Modifying Factors, Severity)  Note limiting factors.   Janet Bentley is a 31 y.o. female who presents to the emergency department with palpitations.  Patient states that palpitations began this morning at 9 AM and awoke patient.  Patient describes sensation as "fluttering feeling."  Patient states "my heart is beating fast."  Patient states that her watch reported heart rate as high as 170.  Patient has not had a similar experience in the past.  Patient is on no new medications.  Patient denies any recent intake of stimulants such as caffeine or decongestants.  Patient denies chest pain, shortness of breath, nausea, vomiting, sweatiness, black or bloody stools, or syncope.  Patient states that sensation has been continuous.    HPI    NursingNotes were reviewed.    REVIEW OF SYSTEMS    (2-9 systems for level 4, 10 or more for level 5)     Review of Systems   Constitutional:  Negative for chills and fever.   HENT:  Negative for congestion and rhinorrhea.    Respiratory:  Negative for cough and shortness of breath.    Cardiovascular:  Positive for palpitations. Negative for chest pain.   Gastrointestinal:  Negative for abdominal pain, nausea and vomiting.   Genitourinary:  Negative for difficulty urinating and dysuria.   Musculoskeletal:  Negative for back pain and neck pain.   Skin:  Negative for color change and rash.   Neurological:  Negative for  syncope and light-headedness.   Psychiatric/Behavioral:  Negative for agitation and confusion.    All other systems reviewed and are negative.           PAST MEDICALHISTORY     Past Medical History:   Diagnosis Date    Heart abnormality     chf?  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 38    Mental disorder     anxiety/depression         SURGICAL HISTORY       Past Surgical History:   Procedure Laterality Date    APPENDECTOMY      HYSTERECTOMY (CERVIX STATUS UNKNOWN)           CURRENT MEDICATIONS     Discharge Medication List as of 06/02/2022  2:43 PM        CONTINUE these medications which have NOT CHANGED    Details   ibuprofen (ADVIL;MOTRIN) 800 MG tablet Take 1 tablet by mouth every 8 hours as needed for Pain, Disp-90 tablet, R-0Normal             ALLERGIES     Patient has no known allergies.    FAMILY HISTORY       Family History   Problem Relation Age of Onset    Diabetes Paternal Grandfather     Diabetes Paternal Grandmother  Diabetes Maternal Grandmother     Diabetes Paternal Aunt           SOCIAL HISTORY       Social History     Socioeconomic History    Marital status: Single   Tobacco Use    Smoking status: Former     Types: Cigarettes     Quit date: 12/07/2020     Years since quitting: 1.5    Smokeless tobacco: Never   Vaping Use    Vaping Use: Never used   Substance and Sexual Activity    Alcohol use: Not Currently    Drug use: Never    Sexual activity: Yes     Partners: Male       SCREENINGS    Glasgow Coma Scale  Eye Opening: Spontaneous  Best Verbal Response: Oriented  Best Motor Response: Obeys commands  Glasgow Coma Scale Score: 15        PHYSICAL EXAM    (up to 7 for level 4, 8 or more for level 5)     ED Triage Vitals [06/02/22 1055]   BP Temp Temp src Pulse Respirations SpO2 Height Weight - Scale   (!) 131/98 98.3 F (36.8 C) -- (!) 133 18 100 % 5\' 8"  (1.727 m) 198 lb (89.8 kg)       Physical Exam  Vitals and nursing note reviewed.   Constitutional:       General: She is not in acute distress.      Appearance: Normal appearance.   HENT:      Head: Normocephalic and atraumatic.      Right Ear: External ear normal.      Left Ear: External ear normal.      Nose: Nose normal. No congestion or rhinorrhea.      Mouth/Throat:      Mouth: Mucous membranes are moist.      Pharynx: Oropharynx is clear. No oropharyngeal exudate or posterior oropharyngeal erythema.   Eyes:      General: No scleral icterus.     Conjunctiva/sclera: Conjunctivae normal.      Pupils: Pupils are equal, round, and reactive to light.   Cardiovascular:      Rate and Rhythm: Tachycardia present. Rhythm irregular.      Pulses: Normal pulses.      Heart sounds: Normal heart sounds. No murmur heard.  Pulmonary:      Effort: Pulmonary effort is normal. No respiratory distress.      Breath sounds: No stridor. No wheezing, rhonchi or rales.   Abdominal:      General: Bowel sounds are normal. There is no distension.      Palpations: Abdomen is soft.      Tenderness: There is no abdominal tenderness. There is no guarding or rebound.   Musculoskeletal:         General: No tenderness or deformity.      Cervical back: Neck supple. No rigidity.      Right lower leg: No edema.      Left lower leg: No edema.   Skin:     General: Skin is warm and dry.      Capillary Refill: Capillary refill takes less than 2 seconds.      Coloration: Skin is pale (mild). Skin is not jaundiced.   Neurological:      General: No focal deficit present.      Mental Status: She is alert and oriented to person, place, and time. Mental status is at  baseline.      Cranial Nerves: No cranial nerve deficit.      Sensory: No sensory deficit.      Motor: No weakness.      Coordination: Coordination normal.      Gait: Gait normal.   Psychiatric:         Mood and Affect: Mood normal.         Behavior: Behavior normal.         DIAGNOSTIC RESULTS     EKG: All EKG's areinterpreted by the Emergency Department Physician who either signs or Co-signs this chart in the absence of a  cardiologist.    EKG dated 06/02/2022 at 10:59 AM: Atrial fibrillation, rate 158.  PAC.  QRS 82 QTc 474.  T wave inversions in 2, 3, aVF and flattening in V4 through V6.    EKG #2 dated April 01, 2022 at 11:34 AM: Normal sinus rhythm, rate 98.  T wave flattening in 3 and aVF.  PR 148 QRS 87 QTc 423.    RADIOLOGY:  Non-plain film images such as CT, Ultrasound and MRI are read by the radiologist. Plain radiographic images are visualized and preliminarily interpreted bythe emergency physician with the below findings:          XR CHEST PORTABLE   Final Result   Negative chest. No active cardiopulmonary disease           ______________________________________    Electronically signed by: Micael Hampshire M.D.   Date:     06/02/2022   Time:    12:12               LABS:  Labs Reviewed   CBC WITH AUTO DIFFERENTIAL - Abnormal; Notable for the following components:       Result Value    WBC 12.7 (*)     Neutrophils % 83.3 (*)     Lymphocytes % 11.2 (*)     Neutrophils Absolute 10.6 (*)     All other components within normal limits   COMPREHENSIVE METABOLIC PANEL - Abnormal; Notable for the following components:    Glucose 183 (*)     All other components within normal limits   COVID-19, RAPID   HCG, SERUM, QUALITATIVE   MAGNESIUM   TROPONIN   APTT   TSH WITH REFLEX TO FT4       All other labs were within normal range or not returned as of this dictation.    EMERGENCY DEPARTMENT COURSE and DIFFERENTIAL DIAGNOSIS/MDM:   Vitals:    Vitals:    06/02/22 1253 06/02/22 1355 06/02/22 1357 06/02/22 1449   BP:  115/77 115/77 111/68   Pulse: (!) 102 95 95 85   Resp: 14  16 16    Temp:    98.1 F (36.7 C)   SpO2:   98% 98%   Weight:       Height:           MDM     Amount and/or Complexity of Data Reviewed  Clinical lab tests: reviewed  Tests in the radiology section of CPT: reviewed    31 year old female presents to the emergency department with palpitations.  EKG reveals A-fib with RVR at 158.  Patient is in no acute distress.  As Cardizem  is being prepared by nursing, patient spontaneously converts to normal sinus rhythm.  Lab, EKG, and radiology results reviewed.  Discussed with Dr.Sheshadri, cardiologist, who recommends starting Lopressor 25 mg twice daily.  He does not recommend starting  anticoagulation at this time.  He recommends patient following up with Dr. Mayford Knife, EP cardiology, as well as her primary care provider, Knute Neu, APRN-CNP.  Patient will return with persistent palpitations, chest pain, difficulty breathing, or other concerns.  Patient verbalizes understanding and agreement with plan of care.        CONSULTS:  None    PROCEDURES:  Unless otherwise noted below, none     Procedures    FINAL IMPRESSION      1. New onset atrial fibrillation [facility])          DISPOSITION/PLAN   DISPOSITION Decision To Discharge 06/02/2022 02:34:56 PM      PATIENT REFERRED TO:  Cherie Ouch, APRN - CNP  [address]  [place] [address]    Schedule an appointment as soon as possible for a visit       Dorothey Baseman, MD  [address]  [address]  [place] [address]  [phone]    Schedule an appointment as soon as possible for a visit         DISCHARGE MEDICATIONS:  Discharge Medication List as of 06/02/2022  2:43 PM        START taking these medications    Details   metoprolol tartrate (LOPRESSOR) 50 MG tablet Take 0.5 tablets by mouth 2 times daily, Disp-60 tablet, R-0Normal                (Please note that portions of this note were completed with a voice recognition program.  Efforts were made to edit thedictations but occasionally words are mis-transcribed.)    Roena Malady, MD (electronically signed)  Attending Emergency Physician          Roena Malady, MD  06/08/22 1235

## 2022-06-02 NOTE — Telephone Encounter (Signed)
Janet Bentley called in to schedule a new patient hospital follow up with Dr.Thomas Jimmye Norman for new on set a-fib  [facility] unable to accommodate within needed timeframe  Please call patient to schedule  Thank you

## 2022-06-02 NOTE — Discharge Instructions (Signed)
Return or seek medical attention immediately with palpitations, chest pain, difficulty breathing, or other concerns.

## 2022-06-03 NOTE — Telephone Encounter (Signed)
Pt has been scheduled

## 2022-06-05 LAB — EKG 12-LEAD
P Axis: 64 degrees
P-R Interval: 136 ms
Q-T Interval: 342 ms
QRS Duration: 80 ms
QTc Calculation (Bazett): 406 ms
T Axis: 18 degrees

## 2022-06-08 ENCOUNTER — Ambulatory Visit: Admit: 2022-06-08 | Discharge: 2022-06-08 | Payer: MEDICAID | Attending: Cardiovascular Disease | Primary: Family

## 2022-06-08 DIAGNOSIS — I4891 Unspecified atrial fibrillation: Secondary | ICD-10-CM

## 2022-06-08 NOTE — Progress Notes (Signed)
Janet Bentley is a 31 y.o. female presents with new onset atrial fibrillation.  The patient awakened from sleep and stood up rapidly and became dizzy.  She realized her heart rate was in excess of 170 bpm and she felt a little lightheaded.  She had no chest pain and no shortness of breath.  She says normally her resting heart rates are in the 100s.  She drinks water but she is orthostatic.  Her urine looks pretty clear she reports.  When she presented to the emergency department her atrial fibrillation spontaneously converted.  She was placed on metoprolol and feels markedly better now.      Review of Systems   Constitutional: Negative for fever, chills, diaphoresis, activity change, appetite change, fatigue and unexpected weight change.   Eyes: Negative for photophobia, pain, redness and visual disturbance.   Respiratory: Negative for apnea, cough, chest tightness, shortness of breath, wheezing and stridor.    Cardiovascular: Negative for chest pain, palpitations and leg swelling.   Gastrointestinal: Negative for abdominal distention.   Genitourinary: Negative for dysuria, urgency and frequency.   Musculoskeletal: Negative for myalgias, arthralgias and gait problem.   Skin: Negative for color change, pallor, rash and wound.   Neurological: Negative for dizziness, tremors, speech difficulty, weakness and numbness.   Hematological: Does not bruise/bleed easily.   Psychiatric/Behavioral: Negative.        Past Medical History:   Diagnosis Date    Atrial fibrillation (HCC)     Heart abnormality     chf?  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 68    Mental disorder     anxiety/depression       Past Surgical History:   Procedure Laterality Date    APPENDECTOMY      HYSTERECTOMY (CERVIX STATUS UNKNOWN)      Partial       Family History   Problem Relation Age of Onset    Diabetes Paternal Aunt     Diabetes Maternal Grandmother     Heart Attack Paternal Grandmother     Diabetes Paternal Grandmother     Heart Disease Paternal  Grandmother     Diabetes Paternal Grandfather     Heart Attack Paternal Grandfather     Heart Disease Paternal Grandfather        Social History     Socioeconomic History    Marital status: Single     Spouse name: Not on file    Number of children: Not on file    Years of education: Not on file    Highest education level: Not on file   Occupational History    Not on file   Tobacco Use    Smoking status: Former     Types: Cigarettes     Quit date: 12/07/2020     Years since quitting: 1.5    Smokeless tobacco: Never   Vaping Use    Vaping Use: Never used   Substance and Sexual Activity    Alcohol use: Never    Drug use: Never    Sexual activity: Yes     Partners: Male   Other Topics Concern    Not on file   Social History Narrative    Not on file     Social Determinants of Health     Financial Resource Strain: Not on file   Food Insecurity: Not on file   Transportation Needs: Not on file   Physical Activity: Not on file   Stress: Not on file  Social Connections: Not on file   Intimate Partner Violence: Not on file   Housing Stability: Not on file       No Known Allergies      Current Outpatient Medications:     Loratadine (CLARITIN PO), Take by mouth, Disp: , Rfl:     metoprolol tartrate (LOPRESSOR) 50 MG tablet, Take 0.5 tablets by mouth 2 times daily, Disp: 60 tablet, Rfl: 0    PE:  Vitals:    06/08/22 1405   BP: 118/74   Pulse: 76   Weight: 215 lb (97.5 kg)   Height: 5\' 8"  (1.727 m)       Estimated body mass index is 32.69 kg/m as calculated from the following:    Height as of this encounter: 5\' 8"  (1.727 m).    Weight as of this encounter: 215 lb (97.5 kg).    Constitutional: She is oriented to person, place, and time. She appears well-developed and well-nourished in no acute distress.  Neck:  Neck supple without JVD present. No trachea deviation present. No thyromegaly present.   Eyes:Conjunctivae and EOM are normal. Pupils equal and reactive to light.   ZWC:HENIDPO appears normal.conjunctiva and lids are normal,  ears and nose appear normal.  Cardiovascular: Normal rate, S1-S2 regular rhythm, normal heart sounds.  No murmur ascultated.  No gallop and no friction rub.  No carotid bruits.  No peripheral edema.    Pulmonary/Chest:  Lungs clear to auscultation bilaterally without evidence of respiratory distress.  She without wheezes. She without rales or ronchi.   Musculoskeletal: Normal range of motion.  Gait is normal no assitive device. Head is normocephalic and atraumatic.  Skin: Skin is warm and dry without rash or pallor.   Psychiatric:She is alert and oriented to person, place, and time.  She has a normal mood and affect. Her behavior is normal. Thought content normal.       Lab Results   Component Value Date/Time    CREATININE 0.7 06/02/2022 10:59 AM    CREATININE 0.6 01/14/2021 12:04 PM    CREATININE 0.6 01/12/2021 11:21 AM    HGB 14.8 06/02/2022 10:59 AM    HGB 12.7 01/14/2021 12:04 PM    HGB 12.7 01/12/2021 11:21 AM           ECG 06/08/22    Atrial fibrillation with rapid ventricular response in the emergency department    Assessment, Recommendations, & Plan:  31 y.o. female with new onset atrial fibrillation.  She reports she had a stress test and an echo a couple of years ago which were both normal.  I do not have those results and they were done at another hospital.  I think would be reasonable to repeat the echocardiogram at this time.  Her thyroid-stimulating hormone was normal as was her troponin.  She has a CHA2DS2-VASc score of 0 and I would not recommend anticoagulation.  We will continue the metoprolol.  I would not recommend long-term monitoring at this point as it will not change the plan.  Down the road she may want to consider pulmonary vein isolation if her burden becomes higher.      Disposition - RTC in 6 months or sooner if needed      Please do not hesitate to contact me for any questions or concerns.    Dr. Joyice Faster. Marylou Wages  Electrophysiology and Cardiology  [facility], Cardiology  [phone]

## 2022-06-16 ENCOUNTER — Inpatient Hospital Stay: Admit: 2022-06-16 | Payer: MEDICAID | Primary: Family

## 2022-06-16 DIAGNOSIS — I4891 Unspecified atrial fibrillation: Secondary | ICD-10-CM

## 2022-06-16 LAB — ECHOCARDIOGRAM COMPLETE 2D W DOPPLER W COLOR: Left Ventricular Ejection Fraction: 58

## 2022-06-16 MED ORDER — PERFLUTREN LIPID MICROSPHERE IV SUSP
Freq: Once | INTRAVENOUS | Status: AC | PRN
Start: 2022-06-16 — End: 2022-06-17
  Administered 2022-06-16: 15:00:00 1.5 mL via INTRAVENOUS

## 2022-07-22 MED ORDER — METOPROLOL TARTRATE 50 MG PO TABS
50 MG | ORAL_TABLET | Freq: Two times a day (BID) | ORAL | 5 refills | Status: DC
Start: 2022-07-22 — End: 2022-10-28

## 2022-09-02 DIAGNOSIS — R1032 Left lower quadrant pain: Secondary | ICD-10-CM

## 2022-09-03 ENCOUNTER — Emergency Department: Admit: 2022-09-03 | Payer: MEDICAID | Primary: Family

## 2022-09-03 ENCOUNTER — Inpatient Hospital Stay
Admit: 2022-09-03 | Discharge: 2022-09-03 | Disposition: A | Discharge status: Home / Self Care | Payer: MEDICAID | Attending: Emergency Medicine

## 2022-09-03 ENCOUNTER — Emergency Department: Payer: MEDICAID | Primary: Family

## 2022-09-03 LAB — URINALYSIS
Bilirubin Urine: NEGATIVE
Blood, Urine: NEGATIVE
Glucose, Ur: NEGATIVE mg/dL
Ketones, Urine: NEGATIVE mg/dL
Leukocyte Esterase, Urine: NEGATIVE
Nitrite, Urine: NEGATIVE
Protein, UA: NEGATIVE mg/dL
Specific Gravity, UA: >=1.045 (ref 1.005–1.030)
Urobilinogen, Urine: 0.2 E.U./dL (ref <2.0)
pH, UA: 5.5 (ref 5.0–8.0)

## 2022-09-03 LAB — HCG, SERUM, QUALITATIVE: hCG Qual: NEGATIVE

## 2022-09-03 LAB — CBC WITH AUTO DIFFERENTIAL
Basophils %: 0.3 % (ref 0.0–1.0)
Basophils Absolute: 0 10*3/uL (ref 0.00–0.20)
Eosinophils %: 0.7 % (ref 0.0–5.0)
Eosinophils Absolute: 0.1 10*3/uL (ref 0.00–0.60)
Hematocrit: 37.3 % (ref 37.0–47.0)
Hemoglobin: 12.5 g/dL (ref 12.0–16.0)
Immature Granulocytes #: 0 10*3/uL
Lymphocytes %: 33.4 % (ref 20.0–40.0)
Lymphocytes Absolute: 2.3 10*3/uL (ref 1.1–4.5)
MCH: 30.4 pg (ref 27.0–31.0)
MCHC: 33.5 g/dL (ref 33.0–37.0)
MCV: 90.8 fL (ref 81.0–99.0)
MPV: 11.4 fL (ref 9.4–12.3)
Monocytes %: 7.1 % (ref 0.0–10.0)
Monocytes Absolute: 0.5 10*3/uL (ref 0.00–0.90)
Neutrophils %: 58.4 % (ref 50.0–65.0)
Neutrophils Absolute: 4 10*3/uL (ref 1.5–7.5)
Platelets: 237 10*3/uL (ref 130–400)
RBC: 4.11 M/uL — ABNORMAL LOW (ref 4.20–5.40)
RDW: 12 % (ref 11.5–14.5)
WBC: 6.8 10*3/uL (ref 4.8–10.8)

## 2022-09-03 LAB — COMPREHENSIVE METABOLIC PANEL
ALT: 17 U/L (ref 5–33)
AST: 17 U/L (ref 5–32)
Albumin: 4.1 g/dL (ref 3.5–5.2)
Alkaline Phosphatase: 52 U/L (ref 35–104)
Anion Gap: 9 mmol/L (ref 7–19)
BUN: 8 mg/dL (ref 6–20)
CO2: 27 mmol/L (ref 22–29)
Calcium: 9.2 mg/dL (ref 8.6–10.0)
Chloride: 105 mmol/L (ref 98–111)
Creatinine: 0.7 mg/dL (ref 0.5–0.9)
Est, Glom Filt Rate: >60 (ref >60)
Glucose: 97 mg/dL (ref 74–109)
Potassium: 3.2 mmol/L — ABNORMAL LOW (ref 3.5–5.0)
Sodium: 141 mmol/L (ref 136–145)
Total Bilirubin: <0.2 mg/dL (ref 0.2–1.2)
Total Protein: 7.1 g/dL (ref 6.6–8.7)

## 2022-09-03 MED ORDER — IOPAMIDOL 76 % IV SOLN
76 % | Freq: Once | INTRAVENOUS | Status: AC | PRN
Start: 2022-09-03 — End: 2022-09-03
  Administered 2022-09-03: 07:00:00 70 mL via INTRAVENOUS

## 2022-09-03 NOTE — ED Provider Notes (Incomplete)
[facility] EMERGENCY DEPT  eMERGENCY dEPARTMENT eNCOUnter      Pt Name: Janet Bentley  MRN: [id number]  Birthdate April 03, 1991  Date of evaluation: 09/02/2022  Provider:  Abbot, MD    CHIEF COMPLAINT       Chief Complaint   Patient presents with    Abdominal Pain     LLQ, felt a "pop" and now is having cramping          HISTORY OF PRESENT ILLNESS   (Location/Symptom, Timing/Onset,Context/Setting, Quality, Duration, Modifying Factors, Severity)  Note limiting factors.   Janet Bentley is a 31 y.o. female who presents to the emergency department ***     HPI    NursingNotes were reviewed.    REVIEW OF SYSTEMS    (2-9 systems for level 4, 10 or more for level 5)     Review of Systems         PAST MEDICALHISTORY     Past Medical History:   Diagnosis Date    Atrial fibrillation (HCC)     Heart abnormality     chf?  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 8    Mental disorder     anxiety/depression         SURGICAL HISTORY       Past Surgical History:   Procedure Laterality Date    APPENDECTOMY      HYSTERECTOMY (CERVIX STATUS UNKNOWN)      Partial         CURRENT MEDICATIONS     Previous Medications    LORATADINE (CLARITIN PO)    Take by mouth    METFORMIN (GLUCOPHAGE) 500 MG TABLET    Take 1 tablet by mouth daily (with breakfast)    METOPROLOL TARTRATE (LOPRESSOR) 50 MG TABLET    Take 0.5 tablets by mouth 2 times daily       ALLERGIES     Patient has no known allergies.    FAMILY HISTORY       Family History   Problem Relation Age of Onset    Diabetes Paternal Aunt     Diabetes Maternal Grandmother     Heart Attack Paternal Grandmother     Diabetes Paternal Grandmother     Heart Disease Paternal Grandmother     Diabetes Paternal Grandfather     Heart Attack Paternal Grandfather     Heart Disease Paternal Grandfather           SOCIAL HISTORY       Social History     Socioeconomic History    Marital status: Single     Spouse name: None    Number of children: None    Years of education: None    Highest education level: None   Tobacco  Use    Smoking status: Former     Current packs/day: 0.00     Types: Cigarettes     Quit date: 12/07/2020     Years since quitting: 1.7    Smokeless tobacco: Never   Vaping Use    Vaping Use: Never used   Substance and Sexual Activity    Alcohol use: Never    Drug use: Never    Sexual activity: Yes     Partners: Male       SCREENINGS    Glasgow Coma Scale  Eye Opening: Spontaneous  Best Verbal Response: Oriented  Best Motor Response: Obeys commands  Glasgow Coma Scale Score: 15        PHYSICAL EXAM    (  up to 7 for level 4, 8 or more for level 5)     ED Triage Vitals [09/02/22 2315]   BP Temp Temp Source Pulse Respirations SpO2 Height Weight - Scale   (!) 151/94 97.4 F (36.3 C) Temporal 84 18 99 % 1.727 m (5\' 8" ) 90.7 kg (200 lb)       Physical Exam    DIAGNOSTIC RESULTS     EKG: All EKG's areinterpreted by the Emergency Department Physician who either signs or Co-signs this chart in the absence of a cardiologist.    ***    RADIOLOGY:  Non-plain film images such as CT, Ultrasound and MRI are read by the radiologist. Plain radiographic images are visualized and preliminarily interpreted bythe emergency physician with the below findings:    ***      CT ABDOMEN PELVIS W IV CONTRAST Additional Contrast? None   Final Result   Impression:  No acute intra-abdominal or pelvic process        All CT scans are performed using dose optimization techniques as appropriate to the performed exam and include    at least one of the following: Automated exposure control, adjustment of the mA and/or kV according to size, and the use of iterative reconstruction technique.        ______________________________________    Electronically signed by: Godfrey Pick M.D.   Date:     09/03/2022   Time:    02:23               LABS:  Labs Reviewed   COMPREHENSIVE METABOLIC PANEL - Abnormal; Notable for the following components:       Result Value    Potassium 3.2 (*)     All other components within normal limits   CBC WITH AUTO DIFFERENTIAL -  Abnormal; Notable for the following components:    RBC 4.11 (*)     All other components within normal limits   URINALYSIS   HCG, SERUM, QUALITATIVE       All other labs were within normal range or not returned as of this dictation.    EMERGENCY DEPARTMENT COURSE and DIFFERENTIAL DIAGNOSIS/MDM:   Vitals:    Vitals:    09/02/22 2315   BP: (!) 151/94   Pulse: 84   Resp: 18   Temp: 97.4 F (36.3 C)   TempSrc: Temporal   SpO2: 99%   Weight: 90.7 kg (200 lb)   Height: 1.727 m (5\' 8" )       MDM      Reassessment  ***    CONSULTS:  None    PROCEDURES:  Unless otherwise noted below, none     Procedures    FINAL IMPRESSION      1. Abdominal pain, left lower quadrant          DISPOSITION/PLAN   DISPOSITION Decision To Discharge 09/03/2022 05:31:31 AM      PATIENT REFERRED TO:  Cherie Ouch, APRN - CNP  [address]  [place] [id number]            DISCHARGE MEDICATIONS:  New Prescriptions    No medications on file          (Please note that portions of this note were completed with a voice recognition program.  Efforts were made to edit thedictations but occasionally words are mis-transcribed.)    [facility] Abbot, MD (electronically signed)  Attending Emergency Physician

## 2022-09-28 ENCOUNTER — Ambulatory Visit: Admit: 2022-09-28 | Discharge: 2022-09-28 | Payer: MEDICAID | Attending: Family | Primary: Family

## 2022-09-28 DIAGNOSIS — I480 Paroxysmal atrial fibrillation: Secondary | ICD-10-CM

## 2022-09-28 NOTE — Progress Notes (Signed)
[facility], [place].  [facility]  [address], [place], [place]  [address]  [phone] office  [phone] fax      OFFICE VISIT:  09/28/2022    Janet Bentley - DOB: [birth year]  Reason For Visit:  Janet Bentley is a 32 y.o. female who is here for Follow-up and Atrial Fibrillation (This patient stated that she had an episode of Afib)    History:   Diagnosis Orders   1. PAF (paroxysmal atrial fibrillation) ([place])        2. Irregular heart rhythm  LONGTERM CONTINUOUS CARDIAC EVENT MONITOR (ZIO)        The patient presents today with report of AF episode 2 days ago.  She reports "it lasted about 4 hours.  I went to my PCP and by then I was in sinus arrhythmia.  That was my second episode so far.  She has been evaluated by Dr. Jimmye Norman in the office on 06/08/22. His assessment note as follows:  "Assessment, Recommendations, & Plan:  32 y.o. female with new onset atrial fibrillation.  She reports she had a stress test and an echo a couple of years ago which were both normal.  I do not have those results and they were done at another hospital.  I think would be reasonable to repeat the echocardiogram at this time.  Her thyroid-stimulating hormone was normal as was her troponin.  She has a CHA2DS2-VASc score of 0 and I would not recommend anticoagulation.  We will continue the metoprolol.  I would not recommend long-term monitoring at this point as it will not change the plan.  Down the road she may want to consider pulmonary vein isolation if her burden becomes higher."    The patient has normal thyroid function.  She is s/p partial hysterectomy.  EKG today could not be saved but was reviewed - sinus brady 57 bpm - no acute ischemic changes.       Subjective  Janet Bentley denies exertional chest pain, shortness of breath, orthopnea, paroxysmal nocturnal dyspnea, syncope, presyncope, edema and fatigue.  The patient denies numbness or weakness to suggest cerebrovascular accident or  transient ischemic attack.  + episode of irregular heart rate a few days ago lasting 4 hours.  This is second sensed episode.    Janet Bentley has the following history as recorded in [employer]:  Patient Active Problem List   Diagnosis Code    [redacted] weeks gestation of pregnancy Z3A.32    Ectopic pregnancy without intrauterine pregnancy O00.90     Past Medical History:   Diagnosis Date    Atrial fibrillation ([place])     Heart abnormality     chf?  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 59    Mental disorder     anxiety/depression     Past Surgical History:   Procedure Laterality Date    APPENDECTOMY      HYSTERECTOMY (CERVIX STATUS UNKNOWN)      Partial     Family History   Problem Relation Age of Onset    Diabetes Paternal Aunt     Diabetes Maternal Grandmother     Heart Attack Paternal Grandmother     Diabetes Paternal Grandmother     Heart Disease Paternal Grandmother     Diabetes Paternal Grandfather     Heart Attack Paternal Grandfather     Heart Disease Paternal Grandfather      Social History     Tobacco Use  Smoking status: Former     Current packs/day: 0.00     Types: Cigarettes     Quit date: 12/07/2020     Years since quitting: 1.8    Smokeless tobacco: Never   Substance Use Topics    Alcohol use: Never      Current Outpatient Medications   Medication Sig Dispense Refill    metFORMIN (GLUCOPHAGE) 500 MG tablet Take 1 tablet by mouth daily (with breakfast)      metoprolol tartrate (LOPRESSOR) 50 MG tablet Take 0.5 tablets by mouth 2 times daily 60 tablet 5    Loratadine (CLARITIN PO) Take by mouth       No current facility-administered medications for this visit.       Allergies: Patient has no known allergies.    Review of Systems  Constitutional - no appetite change, or unexpected weight change. No fever, chills or diaphoresis.  No significant change in activity level or new onset of fatigue.   HEENT - no significant rhinorrhea or epistaxis. No tinnitus or significant hearing loss.   Eyes - no sudden vision  change or amaurosis. No corneal arcus, xantholasma, subconjunctival hemorrhage or discharge.  Respiratory - no significant wheezing, stridor, apnea or cough.  No dyspnea on exertion or shortness of air.  Cardiovascular - no exertional chest pain to suggest myocardial ischemia.  No orthopnea or PND.  No occurrence of slow heart rate.  No palpitations.  No claudication.  + episode of irregular heart rate a few days ago lasting 4 hours.  This is second sensed episode.  Gastrointestinal - no abdominal swelling or pain. No blood in stool. No severe constipation, diarrhea, nausea, or vomiting.   Genitourinary - no dysuria, frequency, or urgency. No flank pain or hematuria.   Musculoskeletal - no back pain or myalgia.  No problems with gait.  Extremities - no clubbing, cyanosis or extremity edema.  Skin - no color change or rash.  No pallor.  No new surgical incision.   Neurologic - no speech difficulty, facial asymmetry or lateralizing weakness.  No seizures, presyncope or syncope.  No significant dizziness.  Hematologic - no easy bruising or excessive bleeding.   Psychiatric - no severe anxiety or insomnia.  No confusion.   All other review of systems are negative.    Objective  Vital Signs - BP (!) 138/100   Ht 1.727 m (5\' 8" )   Wt 91.6 kg (202 lb)   LMP 08/30/2022   BMI 30.71 kg/m   General - [place] is alert, cooperative, and pleasant.  Well groomed.  No acute distress.    Body habitus - Body mass index is 30.71 kg/m.  HEENT - Head is normocephalic. No circumoral cyanosis.  Dentition is normal.  EYES -   Lids normal without ptosis.  No discharge, edema or subconjunctival hemorrhage.   Neck - Symmetrical without apparent mass or lymphadenopathy.   Respiratory - Normal respiratory effort without use of accessory muscles.  Ausculatation reveals vesicular breath sounds without crackles, wheezes, rub or rhonchi.    Cardiovascular - No jugular venous distention.  Auscultation reveals regular rate and rhythm.  No  audible clicks, gallop or rub.  No murmur.  No lower extremity varicosities.  No carotid bruits.    Abdominal -  No visible distention, mass or pulsations.  Extremities - No clubbing or cyanosis.  No statis dermatitis or ulcers. No edema.    Musculoskeletal -   No Osler's nodes.  No kyphosis or scoliosis.  Gait is  even and regular without limp or shuffle. Ambulates without assistance.  Skin -  Warm and dry; no rash or pallor.   No new surgical wound.  Neurological - No focal neurological deficits.  Thought processes coherent.  No apparent tremor.   Oriented to person, place and time.    Psychiatric -  Appropriate affect and mood.     Data reviewed:   Normal left ventricular size with preserved LV function and an estimated   ejection fraction of approximately 55-60%. No regional wall motion   abnormalities. Normal left ventricular wall thickness. Normal diastolic   filling pattern.   Normal right ventricular size with preserved RV function (TAPSE 23 mm).   Normal bi-atrial size.   Mild tricuspid regurgitation with estimated RVSP of 20 mmHg.   Aortic root is within normal limits.   Ascending aorta is within normal limits.   IVC normal.   No evidence of significant pericardial effusion is noted.   The rhythm is sinus.   Electronically signed by Loni Muse Simone(Interpreting physician)   on 06/16/2022 02:05 PM    Lab Results   Component Value Date    TSHFT4 2.98 06/02/2022     Lab Results   Component Value Date    WBC 6.8 09/03/2022    HGB 12.5 09/03/2022    HCT 37.3 09/03/2022    MCV 90.8 09/03/2022    PLT 237 09/03/2022     Lab Results   Component Value Date    NA 141 09/03/2022    K 3.2 (L) 09/03/2022    CL 105 09/03/2022    CO2 27 09/03/2022    BUN 8 09/03/2022    CREATININE 0.7 09/03/2022    GLUCOSE 97 09/03/2022    CALCIUM 9.2 09/03/2022    PROT 7.1 09/03/2022    LABALBU 4.1 09/03/2022    BILITOT <0.2 09/03/2022    ALKPHOS 52 09/03/2022    AST 17 09/03/2022    ALT 17 09/03/2022    LABGLOM >60 09/03/2022    GFRAA >59  01/14/2021     BP Readings from Last 3 Encounters:   09/28/22 (!) 138/100   09/02/22 (!) 151/94   06/08/22 118/74    Pulse Readings from Last 3 Encounters:   09/02/22 84   06/08/22 76   06/02/22 85        Wt Readings from Last 3 Encounters:   09/28/22 91.6 kg (202 lb)   09/02/22 90.7 kg (200 lb)   06/08/22 97.5 kg (215 lb)     Assessment/Plan:   Diagnosis Orders   1. PAF (paroxysmal atrial fibrillation) (HCC)        2. Irregular heart rhythm  LONGTERM CONTINUOUS CARDIAC EVENT MONITOR (ZIO)        CHA2DS2-VASc Score for Atrial Fibrillation Stroke Risk   Risk   Factors  Component Value   C CHF No 0   H HTN No 0   A2 Age >= 75 No,  (32 y.o.) 0   D DM No 0   S2 Prior Stroke/TIA No 0   V Vascular Disease No 0   A Age 40-74 No,  (32 y.o.) 0   Sc Sex female 1    CHA2DS2-VASc  Score  1   Score last updated 09/28/22 1:58 PM CST  Click here for a link to the UpToDate guideline "Atrial Fibrillation: Anticoagulation therapy to prevent embolization  Disclaimer: Risk Score calculation is dependent on accuracy of patient problem list and past encounter diagnosis.    Stable  CV status without overt heart failure, sensed arrhythmia or angina.      PAF - second episode since 9/23. Both of which spontaneously converted to NSR.    Patient continues on Lopressor 50 mg 1/2 tab twice daily.  Add ECASA 81 mg daily.   2 week Rhythm Star cardiac monitor placed.  Advised to notify office with any AF episode lasting longer than 4 hours.   Follow with Dr. Jimmye Norman in one month.  Briefly discussed AF ablation.     Patient is compliant with medication regimen.    Previous cardiac history and records reviewed.  Continue current medical management for cardiac related condition.  Continue other current medications as directed.  Continue to follow up with primary care provider for non cardiac medical problems.  If your primary care provider is outside of the [facility], please request that your labs be faxed to this office at [phone].  BP  goal 130/80 or less.  Call the office with any problems, questions or concerns at [phone].    Cardiology follow up as scheduled in [facility] appointments.  Follow up with Dr. Jimmye Norman in one month.  Educational included in patient instructions.  Heart health.      Dorene Grebe, APRN

## 2022-09-28 NOTE — Patient Instructions (Addendum)
New instructions for today:  Start low does enteric coated aspirin one tab daily with food.    The adhesive cardiac monitor will need to stay on for 14 days.  Use the symptom marker as instructed.  Mail back the monitor in the postage paid box provided.    If you feel that your heart is out of rhythm and the irregularity lasts for more than 4 to 6 hours, notify the office.  If the office is closed, go to the ER.    Patient Instructions:  Continue current medications as prescribed.   Always keep a current medication list. Bring your medications to every office visit.   Continue to follow up with primary care provider for non cardiac medical problems.  Call the office with any problems, questions or concerns at [phone].  If you have been asked to keep a blood pressure log, do so for 2 weeks. Call the office to report readings to the triage nurse at [phone].  Follow up with cardiologist as scheduled.  The following educational material has been included in this after visit summary for your review: Life simple 7.  Heart health.     Life simple 7  1) Manage blood pressure - high blood pressure is a major risk factor for heart disease and stroke. Keeping blood pressure in health range reduces strain on your heart, arteries and kidneys.  Blood pressure goal is less than 130/80.   2) Control cholesterol - contributes to plaque, which can clog arteries and lead to heart disease and stroke. When you control your cholesterol you are giving your arteries their best chance to remain clear.  It is recommended that you get cholesterol lab work done once a year.  3) Reduce blood sugar - most of the food we eat is turning into glucose or blood sugar that our body uses for energy.  Over time, high levels of blood sugar can damage your heart, kidneys, eyes and nerves.  4) Get active - living an active life is one of the most rewarding gifts you can give yourself and those you love.  Simply put, daily physical activity  increases your length and quality of life. Strive to exercise 15 minutes most days of the week.  5)  Eat better - A healthy diet is one of your best weapons for fighting cardiovascular disease.  When you eat a heart healthy diet, you improve your chances for feeling good and staying healthy for life.  6)  Lose weight - when you shed extra fat an unnecessary pounds, you reduce the burden on your hear, lungs, blood vessels and skeleton.  You give yourself the gift of active living, you lower your blood pressure and help yourself feel better.  7) Stop smoking - cigarette smokers have a higher risk of developing cardiovascular disease.  If  You smoke, quitting is the best thing you can do for your health.  Check American Heart Association on line for more information on Life's Simple 7 and tips for healthy living.     A Healthy Heart: Care Instructions  Your Care Instructions     Coronary artery disease, also called heart disease, occurs when a substance called plaque builds up in the vessels that supply oxygen-rich blood to your heart muscle. This can narrow the blood vessels and reduce blood flow. A heart attack happens when blood flow is completely blocked. A high-fat diet, smoking, and other factors increase the risk of heart disease.  Your doctor has found that  you have a chance of having heart disease. You can do lots of things to keep your heart healthy. It may not be easy, but you can change your diet, exercise more, and quit smoking. These steps really work to lower your chance of heart disease.  Follow-up care is a key part of your treatment and safety. Be sure to make and go to all appointments, and call your doctor if you are having problems. It's also a good idea to know your test results and keep a list of the medicines you take.  How can you care for yourself at home?  Diet  Use less salt when you cook and eat. This helps lower your blood pressure. Taste food before salting. Add only a little salt when  you think you need it. With time, your taste buds will adjust to less salt.  Eat fewer snack items, fast foods, canned soups, and other high-salt, high-fat, processed foods.  Read food labels and try to avoid saturated and trans fats. They increase your risk of heart disease by raising cholesterol levels.  Limit the amount of solid fat-butter, margarine, and shortening-you eat. Use olive, peanut, or canola oil when you cook. Bake, broil, and steam foods instead of frying them.  Eat a variety of fruit and vegetables every day. Dark green, deep orange, red, or yellow fruits and vegetables are especially good for you. Examples include spinach, carrots, peaches, and berries.  Foods high in fiber can reduce your cholesterol and provide important vitamins and minerals. High-fiber foods include whole-grain cereals and breads, oatmeal, beans, brown rice, citrus fruits, and apples.  Eat lean proteins. Heart-healthy proteins include seafood, lean meats and poultry, eggs, beans, peas, nuts, seeds, and soy products.  Limit drinks and foods with added sugar. These include candy, desserts, and soda pop.  Lifestyle changes  If your doctor recommends it, get more exercise. Walking is a good choice. Bit by bit, increase the amount you walk every day. Try for at least 30 minutes on most days of the week. You also may want to swim, bike, or do other activities.  Do not smoke. If you need help quitting, talk to your doctor about stop-smoking programs and medicines. These can increase your chances of quitting for good. Quitting smoking may be the most important step you can take to protect your heart. It is never too late to quit.  Limit alcohol to 2 drinks a day for men and 1 drink a day for women. Too much alcohol can cause health problems.  Manage other health problems such as diabetes, high blood pressure, and high cholesterol. If you think you may have a problem with alcohol or drug use, talk to your doctor.  Medicines  Take your  medicines exactly as prescribed. Call your doctor if you think you are having a problem with your medicine.  If your doctor recommends aspirin, take the amount directed each day. Make sure you take aspirin and not another kind of pain reliever, such as acetaminophen (Tylenol).  When should you call for help?   [id number] if you have symptoms of a heart attack. These may include:  Chest pain or pressure, or a strange feeling in the chest.  Sweating.  Shortness of breath.  Pain, pressure, or a strange feeling in the back, neck, jaw, or upper belly or in one or both shoulders or arms.  Lightheadedness or sudden weakness.  A fast or irregular heartbeat.  After you call 911, the operator may tell you  to chew 1 adult-strength or 2 to 4 low-dose aspirin. Wait for an ambulance. Do not try to drive yourself.  Watch closely for changes in your health, and be sure to contact your doctor if you have any problems.  Where can you learn more?  Go to [email or web address] and sign in to your MyChart account. Enter [phone] in the [place] box to learn more about "A Healthy Heart: Care Instructions."     If you do not have an account, please click on the "Sign Up Now" link.  Current as of: August 21, 2018               Content Version: 12.5   2006-2020 [employer], [employer].   Care instructions adapted under license by [facility]. If you have questions about a medical condition or this instruction, always ask your healthcare professional. [place] any warranty or liability for your use of this information.

## 2022-10-13 NOTE — Telephone Encounter (Signed)
[facility] report reviewed.  Analysis time 09/28/22-10/12/22  Underlying rhythm NSR  Average heart rate 65  Minimum 50 at 15:47:57 PM  Maximum 144 bpm  No VT, AF, SVT, high degree AVB or pauses  PAC burden 1.6%  PVC burden <0.1%  16 patient triggers - NSR, SB, ST and sinus arrhythmia.  SB at 47 bpm - occurred at 11:56 AM - marked as a symptom.  Dorene Grebe, APRN

## 2022-10-14 ENCOUNTER — Encounter

## 2022-10-19 NOTE — Telephone Encounter (Signed)
Called the patient gave her the results of Antoine. She understood all I said, she stated that she is having some skipped beats but will go over with Dr. Jimmye Norman

## 2022-10-28 ENCOUNTER — Ambulatory Visit: Admit: 2022-10-28 | Discharge: 2022-10-28 | Payer: MEDICAID | Attending: Cardiovascular Disease | Primary: Family

## 2022-10-28 DIAGNOSIS — I480 Paroxysmal atrial fibrillation: Secondary | ICD-10-CM

## 2022-10-28 MED ORDER — METOPROLOL TARTRATE 50 MG PO TABS
50 | ORAL_TABLET | Freq: Two times a day (BID) | ORAL | 5 refills | Status: DC
Start: 2022-10-28 — End: 2022-10-28

## 2022-10-28 MED ORDER — METOPROLOL TARTRATE 50 MG PO TABS
50 MG | ORAL_TABLET | Freq: Two times a day (BID) | ORAL | 5 refills | Status: AC
Start: 2022-10-28 — End: 2023-07-21

## 2022-10-28 NOTE — Progress Notes (Signed)
Dashona Delpilar is a 32 y.o. female presents with paroxysmal atrial fibrillation.  She has not been bothered particularly by rapid rates but she is bothered by occasional premature beats.      Review of Systems   Constitutional: Negative for fever, chills, diaphoresis, activity change, appetite change, fatigue and unexpected weight change.   Eyes: Negative for photophobia, pain, redness and visual disturbance.   Respiratory: Negative for apnea, cough, chest tightness, shortness of breath, wheezing and stridor.    Cardiovascular: Negative for chest pain, palpitations and leg swelling.   Gastrointestinal: Negative for abdominal distention.   Genitourinary: Negative for dysuria, urgency and frequency.   Musculoskeletal: Negative for myalgias, arthralgias and gait problem.   Skin: Negative for color change, pallor, rash and wound.   Neurological: Negative for dizziness, tremors, speech difficulty, weakness and numbness.   Hematological: Does not bruise/bleed easily.   Psychiatric/Behavioral: Negative.          Social History     Socioeconomic History    Marital status: Single     Spouse name: Not on file    Number of children: Not on file    Years of education: Not on file    Highest education level: Not on file   Occupational History    Not on file   Tobacco Use    Smoking status: Former     Current packs/day: 0.00     Types: Cigarettes     Quit date: 12/07/2020     Years since quitting: 1.8    Smokeless tobacco: Never   Vaping Use    Vaping Use: Never used   Substance and Sexual Activity    Alcohol use: Never    Drug use: Never    Sexual activity: Yes     Partners: Male   Other Topics Concern    Not on file   Social History Narrative    Not on file     Social Determinants of Health     Financial Resource Strain: Not on file   Food Insecurity: Not on file   Transportation Needs: Not on file   Physical Activity: Not on file   Stress: Not on file   Social Connections: Not on file   Intimate Partner Violence: Not on file    Housing Stability: Not on file       No Known Allergies      Current Outpatient Medications:     metFORMIN (GLUCOPHAGE) 500 MG tablet, Take 1 tablet by mouth daily (with breakfast), Disp: , Rfl:     metoprolol tartrate (LOPRESSOR) 50 MG tablet, Take 0.5 tablets by mouth 2 times daily, Disp: 60 tablet, Rfl: 5    Loratadine (CLARITIN PO), Take by mouth, Disp: , Rfl:     PE:  Vitals:    10/28/22 0908   BP: 132/80   Pulse: 76   SpO2: 100%   Weight: 90.3 kg (199 lb)   Height: 1.727 m ('5\' 8"'$ )       Estimated body mass index is 30.26 kg/m as calculated from the following:    Height as of this encounter: 1.727 m ('5\' 8"'$ ).    Weight as of this encounter: 90.3 kg (199 lb).    Constitutional: She is oriented to person, place, and time. She appears well-developed and well-nourished in no acute distress.  Neck:  Neck supple without JVD present. No trachea deviation present. No thyromegaly present.   Eyes:Conjunctivae and EOM are normal. Pupils equal and reactive to light.  [id number] appears normal.conjunctiva and lids are normal, ears and nose appear normal.  Cardiovascular: Normal rate, S1-S2 regular rhythm, normal heart sounds.  No murmur ascultated.  No gallop and no friction rub.  No carotid bruits.  No peripheral edema.    Pulmonary/Chest:  Lungs clear to auscultation bilaterally without evidence of respiratory distress.  She without wheezes. She without rales or ronchi.   Musculoskeletal: Normal range of motion.  Gait is normal  Head is normocephalic and atraumatic.  Skin: Skin is warm and dry without rash or pallor.   Psychiatric:She is alert and oriented to person, place, and time.  She has a normal mood and affect. Her behavior is normal. Thought content normal.       Lab Results   Component Value Date/Time    CREATININE 0.7 09/03/2022 12:15 AM    CREATININE 0.7 06/02/2022 10:59 AM    CREATININE 0.6 01/14/2021 12:04 PM    HGB 12.5 09/03/2022 12:15 AM    HGB 14.8 06/02/2022 10:59 AM    HGB 12.7 01/14/2021 12:04 PM        Echo-normal EF 60%    Ambulatory monitor-no atrial fibrillation positive PACs     Assessment, Recommendations, & Plan:  32 y.o. female with paroxysmal atrial fibrillation.  She would be a good candidate for pulmonary vein isolation.  Her systolic function is normal and overall she is not having too much problem with the A-fib at this time.  I will increase her metoprolol to 50 mg twice daily.          Disposition - RTC in 3 months or sooner if needed      Please do not hesitate to contact me for any questions or concerns.    Dr. Joyice Faster. Chanler Schreiter  Electrophysiology and Cardiology  [facility]  [phone]

## 2022-12-07 ENCOUNTER — Encounter: Payer: MEDICAID | Attending: Cardiovascular Disease | Primary: Family

## 2023-01-04 ENCOUNTER — Ambulatory Visit: Admit: 2023-01-04 | Discharge: 2023-01-04 | Payer: MEDICAID | Attending: Cardiovascular Disease | Primary: Family

## 2023-01-04 DIAGNOSIS — I480 Paroxysmal atrial fibrillation: Secondary | ICD-10-CM

## 2023-01-04 NOTE — Progress Notes (Signed)
[facility]  [address]  [name]  [facility] [place] [address]  [phone]    Janet Bentley is a 32 y.o. female presents with paroxysmal atrial fibrillation.  Her A-fib has been under recent better control.      Review of Systems   Constitutional: Negative for fever, chills, diaphoresis, activity change, appetite change, fatigue and unexpected weight change.   Eyes: Negative for photophobia, pain, redness and visual disturbance.   Respiratory: Negative for apnea, cough, chest tightness, shortness of breath, wheezing and stridor.    Cardiovascular: Negative for chest pain, palpitations and leg swelling.   Gastrointestinal: Negative for abdominal distention.   Genitourinary: Negative for dysuria, urgency and frequency.   Musculoskeletal: Negative for myalgias, arthralgias and gait problem.   Skin: Negative for color change, pallor, rash and wound.   Neurological: Negative for dizziness, tremors, speech difficulty, weakness and numbness.   Hematological: Does not bruise/bleed easily.   Psychiatric/Behavioral: Negative.          Social History     Socioeconomic History    Marital status: Single     Spouse name: Not on file    Number of children: Not on file    Years of education: Not on file    Highest education level: Not on file   Occupational History    Not on file   Tobacco Use    Smoking status: Former     Current packs/day: 0.00     Types: Cigarettes     Quit date: 12/07/2020     Years since quitting: 2.0    Smokeless tobacco: Never   Vaping Use    Vaping Use: Never used   Substance and Sexual Activity    Alcohol use: Never    Drug use: Never    Sexual activity: Yes     Partners: Male   Other Topics Concern    Not on file   Social History Narrative    Not on file     Social Determinants of Health     Financial Resource Strain: Not on file   Food Insecurity: Not on file   Transportation Needs: Not on file   Physical Activity: Not on file   Stress: Not on file   Social Connections: Not on file   Intimate  Partner Violence: Not on file   Housing Stability: Not on file       No Known Allergies      Current Outpatient Medications:     metoprolol tartrate (LOPRESSOR) 50 MG tablet, Take 1 tablet by mouth 2 times daily, Disp: 60 tablet, Rfl: 5    metFORMIN (GLUCOPHAGE) 500 MG tablet, Take 1 tablet by mouth daily (with breakfast), Disp: , Rfl:     Loratadine (CLARITIN PO), Take by mouth, Disp: , Rfl:     PE:  Vitals:    01/04/23 1516   BP: 122/72   Pulse: 58   SpO2: 100%   Weight: 89.8 kg (198 lb)   Height: 1.727 m (5\' 8" )       Estimated body mass index is 30.11 kg/m as calculated from the following:    Height as of this encounter: 1.727 m (5\' 8" ).    Weight as of this encounter: 89.8 kg (198 lb).    Constitutional: She is oriented to person, place, and time. She appears well-developed and well-nourished in no acute distress.  Neck:  Neck supple without JVD present. No trachea deviation present. No thyromegaly present.   Eyes:Conjunctivae and EOM  are normal. Pupils equal and reactive to light.   ZOX:WRUEAVW appears normal.conjunctiva and lids are normal, ears and nose appear normal.  Cardiovascular: Normal rate, S1-S2 regular rhythm, normal heart sounds.  No no murmur ascultated.  No gallop and no friction rub.  No carotid bruits.  No peripheral edema.    Pulmonary/Chest:  Lungs clear to auscultation bilaterally without evidence of respiratory distress.  She without wheezes. She without rales or ronchi.   Musculoskeletal: Normal range of motion.  Gait is normal  Head is normocephalic and atraumatic.  Skin: Skin is warm and dry without rash or pallor.   Psychiatric:She is alert and oriented to person, place, and time.  She has a normal mood and affect. Her behavior is normal. Thought content normal.       Lab Results   Component Value Date/Time    CREATININE 0.7 09/03/2022 12:15 AM    CREATININE 0.7 06/02/2022 10:59 AM    CREATININE 0.6 01/14/2021 12:04 PM    HGB 12.5 09/03/2022 12:15 AM    HGB 14.8 06/02/2022 10:59 AM     HGB 12.7 01/14/2021 12:04 PM              Assessment, Recommendations, & Plan:  31 y.o. female with paroxysmal atrial fibrillation.  She would be a good candidate for pulmonary vein isolation.  We should have the program up and running soon and I will see her back in 3 months.        Disposition - RTC in 3 months or sooner if needed      Please do not hesitate to contact me for any questions or concerns.    Dr. Clovis Pu. Anala Whisenant  Electrophysiology and Cardiology  [facility]  [phone]

## 2023-03-29 ENCOUNTER — Encounter

## 2023-03-29 ENCOUNTER — Ambulatory Visit: Admit: 2023-03-29 | Discharge: 2023-03-29 | Payer: MEDICAID | Attending: Cardiovascular Disease | Primary: Family

## 2023-03-29 DIAGNOSIS — I480 Paroxysmal atrial fibrillation: Secondary | ICD-10-CM

## 2023-03-29 NOTE — Progress Notes (Signed)
[facility]  [address]  [address]  [facility] [place] [address]  [phone]    Janet Bentley is a 32 y.o. female presents with atrial fibrillation. She has been anticipating the new mapping system in order to undergo ablation for it. She continues to have intermittent symptoms.       Review of Systems   Constitutional: Negative for fever, chills, diaphoresis, activity change, appetite change, fatigue and unexpected weight change.   Eyes: Negative for photophobia, pain, redness and visual disturbance.   Respiratory: Negative for apnea, cough, chest tightness, shortness of breath, wheezing and stridor.    Cardiovascular: Negative for chest pain, palpitations and leg swelling.   Gastrointestinal: Negative for abdominal distention.   Genitourinary: Negative for dysuria, urgency and frequency.   Musculoskeletal: Negative for myalgias, arthralgias and gait problem.   Skin: Negative for color change, pallor, rash and wound.   Neurological: Negative for dizziness, tremors, speech difficulty, weakness and numbness.   Hematological: Does not bruise/bleed easily.   Psychiatric/Behavioral: Negative.          Social History     Socioeconomic History    Marital status: Single     Spouse name: Not on file    Number of children: Not on file    Years of education: Not on file    Highest education level: Not on file   Occupational History    Not on file   Tobacco Use    Smoking status: Former     Current packs/day: 0.00     Types: Cigarettes     Quit date: 12/07/2020     Years since quitting: 2.3    Smokeless tobacco: Never   Vaping Use    Vaping Use: Never used   Substance and Sexual Activity    Alcohol use: Never    Drug use: Never    Sexual activity: Yes     Partners: Male   Other Topics Concern    Not on file   Social History Narrative    Not on file     Social Determinants of Health     Financial Resource Strain: Not on file   Food Insecurity: Not on file   Transportation Needs: Not on file   Physical Activity: Not on file    Stress: Not on file   Social Connections: Not on file   Intimate Partner Violence: Not on file   Housing Stability: Not on file       No Known Allergies      Current Outpatient Medications:     metoprolol tartrate (LOPRESSOR) 50 MG tablet, Take 1 tablet by mouth 2 times daily, Disp: 60 tablet, Rfl: 5    metFORMIN (GLUCOPHAGE) 500 MG tablet, Take 1 tablet by mouth daily (with breakfast), Disp: , Rfl:     Loratadine (CLARITIN PO), Take by mouth, Disp: , Rfl:     PE:  Vitals:    03/29/23 1342   BP: 128/80   Pulse: 100   Weight: 87.5 kg (193 lb)   Height: 1.727 m (5\' 8" )       Estimated body mass index is 29.35 kg/m as calculated from the following:    Height as of this encounter: 1.727 m (5\' 8" ).    Weight as of this encounter: 87.5 kg (193 lb).    Constitutional: She is oriented to person, place, and time. She appears well-developed and well-nourished in no acute distress.  Neck:  Neck supple without JVD present. No trachea deviation present.  No thyromegaly present.   Eyes:Conjunctivae and EOM are normal. Pupils equal and reactive to light.   IHK:[id number] appears normal.conjunctiva and lids are normal, ears and nose appear normal.  Cardiovascular: Normal rate, S1S2 regular rhythm, normal heart sounds. no murmur ascultated.  No gallop and no friction rub.  no carotid bruits.  no peripheral edema.    Pulmonary/Chest:  Lungs clear to auscultation bilaterally without evidence of respiratory distress.  She without wheezes. She without rales or ronchi.   Musculoskeletal: Normal range of motion.  Gait is normal  Head is normocephalic and atraumatic.  Skin: Skin is warm and dry without rash or pallor.   Psychiatric:She is alert and oriented to person, place, and time.  She has a normal mood and affect. Her behavior is normal. Thought content normal.       Lab Results   Component Value Date/Time    CREATININE 0.7 09/03/2022 12:15 AM    CREATININE 0.7 06/02/2022 10:59 AM    CREATININE 0.6 01/14/2021 12:04 PM    HGB 12.5  09/03/2022 12:15 AM    HGB 14.8 06/02/2022 10:59 AM    HGB 12.7 01/14/2021 12:04 PM          Assessment, Recommendations, & Plan:  32 y.o. female with PAF. She wishes to undergo ablation attempt and I think this is very reasonable at her age. Her burden is not high and I think anticoagulation would be higher risk than not in her at age 89 with no other risk factors.         Disposition - RTC following the procedure      Please do not hesitate to contact me for any questions or concerns.    Dr. Clovis Pu. Trish Mancinelli  Electrophysiology and Cardiology  [facility]  [phone]

## 2023-04-06 NOTE — Telephone Encounter (Signed)
Kim with [facility] scheduling called to update office that cardiac cta was denied due to not being high risk, all clinicals were sent in. She said you can try a peer to peer to see if that will get it approved by calling [phone] and following the prompts for peer to peer. Patient is scheduled for test 04/07/2023 and Selena Batten said she will try and reach patient to let her know it was denied by insurance.

## 2023-04-07 ENCOUNTER — Ambulatory Visit: Payer: MEDICAID | Primary: Family

## 2023-04-07 NOTE — Telephone Encounter (Signed)
Reminded dr. Mayford Knife. He stated they are to call him today after he spoke with them yesterday

## 2023-04-11 NOTE — Telephone Encounter (Signed)
Dr. Mayford Knife completed the peer to peer and insurance approved the test. Insurance said the authorization was entered incorrectly. I left message with kimberly at scheduling to let her know. We have rescheduled patient for the test on 04/19/23 @ 9:00 am.

## 2023-04-19 ENCOUNTER — Encounter

## 2023-04-19 ENCOUNTER — Inpatient Hospital Stay: Admit: 2023-04-19 | Payer: MEDICAID | Attending: Cardiovascular Disease | Primary: Family

## 2023-04-19 DIAGNOSIS — Z01818 Encounter for other preprocedural examination: Secondary | ICD-10-CM

## 2023-04-19 DIAGNOSIS — I480 Paroxysmal atrial fibrillation: Secondary | ICD-10-CM

## 2023-04-19 LAB — CBC WITH AUTO DIFFERENTIAL
Basophils %: 0.7 % (ref 0.0–1.0)
Basophils Absolute: 0 10*3/uL (ref 0.00–0.20)
Eosinophils %: 2 % (ref 0.0–5.0)
Eosinophils Absolute: 0.1 10*3/uL (ref 0.00–0.60)
Hematocrit: 38.5 % (ref 37.0–47.0)
Hemoglobin: 13.2 g/dL (ref 12.0–16.0)
Immature Granulocytes #: 0 10*3/uL
Lymphocytes %: 26.1 % (ref 20.0–40.0)
Lymphocytes Absolute: 1.6 10*3/uL (ref 1.1–4.5)
MCH: 31.3 pg — ABNORMAL HIGH (ref 27.0–31.0)
MCHC: 34.3 g/dL (ref 33.0–37.0)
MCV: 91.2 fL (ref 81.0–99.0)
MPV: 10.5 fL (ref 9.4–12.3)
Monocytes %: 6.2 % (ref 0.0–10.0)
Monocytes Absolute: 0.4 10*3/uL (ref 0.00–0.90)
Neutrophils %: 65 % (ref 50.0–65.0)
Neutrophils Absolute: 3.9 10*3/uL (ref 1.5–7.5)
Platelets: 240 10*3/uL (ref 130–400)
RBC: 4.22 M/uL (ref 4.20–5.40)
RDW: 11.9 % (ref 11.5–14.5)
WBC: 5.9 10*3/uL (ref 4.8–10.8)

## 2023-04-19 LAB — BASIC METABOLIC PANEL
Anion Gap: 12 mmol/L (ref 7–19)
BUN: 16 mg/dL (ref 6–20)
CO2: 24 mmol/L (ref 22–29)
Calcium: 9.1 mg/dL (ref 8.6–10.0)
Chloride: 102 mmol/L (ref 98–111)
Creatinine: 0.6 mg/dL (ref 0.5–0.9)
Est, Glom Filt Rate: >90 (ref >60)
Glucose: 104 mg/dL (ref 74–109)
Potassium: 4.2 mmol/L (ref 3.5–5.0)
Sodium: 138 mmol/L (ref 136–145)

## 2023-04-19 MED ORDER — IOPAMIDOL 76 % IV SOLN
76 | Freq: Once | INTRAVENOUS | Status: AC | PRN
Start: 2023-04-19 — End: 2023-04-19
  Administered 2023-04-19: 13:00:00 70 mL via INTRAVENOUS

## 2023-04-20 ENCOUNTER — Encounter

## 2023-04-20 NOTE — Telephone Encounter (Signed)
Afib Ablation Instructions:    Register at the [facility] located on the first floor of [facility]. Enter through hospital main entrance and turn immediately to your left.     Procedure Date: 04/27/23  Cath Lab will call you with your arrival time on 04/26/23  (Times are subject to change)     Please have your lab work ( CBC and BMP) done before your procedure Date. Patient had Cardiac CT and lab work done 04/19/23    Procedure Instructions:   1) You will need to not have anything to eat or drink the morning before the procedure.   2) You will stay overnight for observation.  3) You can still take all of your morning medication with a sip of water   4) Do not take Metoprolol the day before or morning of your procedure.  5) You will need a driver for the procedure.        Went over instructions above with patient and notified her I will send this in a mychart message so that she can refer back to it. Pt voiced understanding.

## 2023-04-27 ENCOUNTER — Inpatient Hospital Stay: Payer: Medicaid (Managed Care) | Attending: Cardiovascular Disease

## 2023-04-27 LAB — POC ACT-HR
Activated Clotting Time: 262 s
Activated Clotting Time: 160 s
Activated Clotting Time: 279 s
Activated Clotting Time: 373 s

## 2023-04-27 LAB — PREGNANCY, URINE: Pregnancy, Urine: NEGATIVE

## 2023-04-27 LAB — POCT GLUCOSE: POC Glucose: 204 mg/dL — ABNORMAL HIGH (ref 70–99)

## 2023-04-27 LAB — ELECTROPHYSIOLOGY PROCEDURE: Body Surface Area: 2.08 m2

## 2023-04-27 MED ORDER — GLYCOPYRROLATE 1 MG/5ML IJ SOLN
1 | INTRAMUSCULAR | Status: DC | PRN
Start: 2023-04-27 — End: 2023-04-27
  Administered 2023-04-27: 16:00:00 .3 via INTRAVENOUS

## 2023-04-27 MED ORDER — DEXAMETHASONE SOD PHOSPHATE PF 10 MG/ML IJ SOLN
10 | INTRAMUSCULAR | Status: DC | PRN
Start: 2023-04-27 — End: 2023-04-27
  Administered 2023-04-27: 16:00:00 10 via INTRAVENOUS

## 2023-04-27 MED ORDER — NORMAL SALINE FLUSH 0.9 % IV SOLN
0.9 | INTRAVENOUS | Status: DC | PRN
Start: 2023-04-27 — End: 2023-04-27

## 2023-04-27 MED ORDER — METFORMIN HCL 500 MG PO TABS
500 | Freq: Every day | ORAL | Status: DC
Start: 2023-04-27 — End: 2023-04-28
  Administered 2023-04-28: 12:00:00 500 mg via ORAL

## 2023-04-27 MED ORDER — HEPARIN SODIUM (PORCINE) 1000 UNIT/ML IJ SOLN
1000 | INTRAMUSCULAR | Status: DC | PRN
Start: 2023-04-27 — End: 2023-04-27
  Administered 2023-04-27: 16:00:00 13000 via INTRAVENOUS

## 2023-04-27 MED ORDER — HEPARIN SOD (PORCINE) IN D5W 100 UNIT/ML IV SOLN
100 | INTRAVENOUS | Status: AC
Start: 2023-04-27 — End: ?

## 2023-04-27 MED ORDER — MEPERIDINE HCL 25 MG/ML IJ SOLN
25 | INTRAMUSCULAR | Status: DC | PRN
Start: 2023-04-27 — End: 2023-04-27

## 2023-04-27 MED ORDER — FENTANYL CITRATE PF 50 MCG/ML IJ SOSY
50 | INTRAMUSCULAR | Status: AC
Start: 2023-04-27 — End: ?

## 2023-04-27 MED ORDER — SODIUM CHLORIDE 0.9 % IV SOLN
0.9 | INTRAVENOUS | Status: DC
Start: 2023-04-27 — End: 2023-04-27
  Administered 2023-04-27 (×2): via INTRAVENOUS

## 2023-04-27 MED ORDER — ONDANSETRON HCL 4 MG/2ML IJ SOLN
4 | INTRAMUSCULAR | Status: DC | PRN
Start: 2023-04-27 — End: 2023-04-27
  Administered 2023-04-27: 17:00:00 4 via INTRAVENOUS

## 2023-04-27 MED ORDER — NORMAL SALINE FLUSH 0.9 % IV SOLN
0.9 | Freq: Two times a day (BID) | INTRAVENOUS | Status: DC
Start: 2023-04-27 — End: 2023-04-28
  Administered 2023-04-28: 10 mL via INTRAVENOUS

## 2023-04-27 MED ORDER — NORMAL SALINE FLUSH 0.9 % IV SOLN
0.9 | INTRAVENOUS | Status: DC | PRN
Start: 2023-04-27 — End: 2023-04-28

## 2023-04-27 MED ORDER — NALOXONE HCL 0.4 MG/ML IJ SOLN
0.4 | INTRAMUSCULAR | Status: DC | PRN
Start: 2023-04-27 — End: 2023-04-27

## 2023-04-27 MED ORDER — LIDOCAINE HCL 2 % IJ SOLN (MIXTURES ONLY)
2 | INTRAMUSCULAR | Status: DC | PRN
Start: 2023-04-27 — End: 2023-04-27
  Administered 2023-04-27 (×2): 10 via INTRADERMAL

## 2023-04-27 MED ORDER — MIDAZOLAM HCL 2 MG/2ML IJ SOLN
2 | INTRAMUSCULAR | Status: DC | PRN
  Administered 2023-04-27: 15:00:00 2 via INTRAVENOUS

## 2023-04-27 MED ORDER — MEPERIDINE HCL 50 MG/ML IJ SOLN
50 | INTRAMUSCULAR | Status: DC | PRN
Start: 2023-04-27 — End: 2023-04-27

## 2023-04-27 MED ORDER — HEPARIN (PORCINE) IN NACL 2000-0.9 UNIT/L-% IV SOLN
INTRAVENOUS | Status: DC | PRN
Start: 2023-04-27 — End: 2023-04-27

## 2023-04-27 MED ORDER — SODIUM CHLORIDE 0.9 % IV SOLN
0.9 | INTRAVENOUS | Status: DC | PRN
Start: 2023-04-27 — End: 2023-04-27

## 2023-04-27 MED ORDER — METOPROLOL TARTRATE 50 MG PO TABS
50 | Freq: Two times a day (BID) | ORAL | Status: DC
Start: 2023-04-27 — End: 2023-04-28

## 2023-04-27 MED ORDER — ROCURONIUM BROMIDE 100 MG/10ML IV SOLN
100 | INTRAVENOUS | Status: DC | PRN
  Administered 2023-04-27: 15:00:00 50 via INTRAVENOUS

## 2023-04-27 MED ORDER — PROPOFOL 200 MG/20ML IV EMUL
200 | INTRAVENOUS | Status: DC | PRN
  Administered 2023-04-27: 15:00:00 200 via INTRAVENOUS

## 2023-04-27 MED ORDER — HEPARIN SODIUM (PORCINE) 1000 UNIT/ML IJ SOLN
1000 | INTRAMUSCULAR | Status: AC
Start: 2023-04-27 — End: ?

## 2023-04-27 MED ORDER — ONDANSETRON HCL 4 MG/2ML IJ SOLN
4 | Freq: Once | INTRAMUSCULAR | Status: DC | PRN
Start: 2023-04-27 — End: 2023-04-27

## 2023-04-27 MED ORDER — HEPARIN SOD (PORCINE) IN D5W 100 UNIT/ML IV SOLN
100 | INTRAVENOUS | Status: DC | PRN
Start: 2023-04-27 — End: 2023-04-27
  Administered 2023-04-27: 16:00:00 1300 via INTRAVENOUS

## 2023-04-27 MED ORDER — MIDAZOLAM HCL 2 MG/2ML IJ SOLN
2 | INTRAMUSCULAR | Status: AC
Start: 2023-04-27 — End: ?

## 2023-04-27 MED ORDER — SUGAMMADEX SODIUM 500 MG/5ML IV SOLN
500 | INTRAVENOUS | Status: DC | PRN
Start: 2023-04-27 — End: 2023-04-27
  Administered 2023-04-27: 17:00:00 200 via INTRAVENOUS

## 2023-04-27 MED ORDER — FENTANYL CITRATE PF 50 MCG/ML IJ SOSY
50 | INTRAMUSCULAR | Status: DC | PRN
  Administered 2023-04-27: 15:00:00 50 via INTRAVENOUS

## 2023-04-27 MED ORDER — HEPARIN SODIUM (PORCINE) 1000 UNIT/ML IJ SOLN
1000 | INTRAMUSCULAR | Status: DC | PRN
Start: 2023-04-27 — End: 2023-04-27
  Administered 2023-04-27: 16:00:00 5000 via INTRAVENOUS

## 2023-04-27 MED ORDER — HYDROMORPHONE HCL PF 1 MG/ML IJ SOLN
1 | INTRAMUSCULAR | Status: DC | PRN
Start: 2023-04-27 — End: 2023-04-27

## 2023-04-27 MED ORDER — LIDOCAINE HCL (PF) 1 % IJ SOLN
1 | INTRAMUSCULAR | Status: DC | PRN
  Administered 2023-04-27: 15:00:00 50 via INTRAVENOUS

## 2023-04-27 MED ORDER — PROTAMINE SULFATE 10 MG/ML IV SOLN
10 | INTRAVENOUS | Status: DC | PRN
Start: 2023-04-27 — End: 2023-04-27
  Administered 2023-04-27: 17:00:00 40 via INTRAVENOUS

## 2023-04-27 MED ORDER — NORMAL SALINE FLUSH 0.9 % IV SOLN
0.9 | Freq: Two times a day (BID) | INTRAVENOUS | Status: DC
Start: 2023-04-27 — End: 2023-04-27

## 2023-04-27 MED ORDER — SODIUM CHLORIDE 0.9 % IV SOLN
0.9 | INTRAVENOUS | Status: DC | PRN
Start: 2023-04-27 — End: 2023-04-28

## 2023-04-27 MED FILL — HEPARIN SOD (PORCINE) IN D5W 100 UNIT/ML IV SOLN: 100 UNIT/ML | INTRAVENOUS | Qty: 250

## 2023-04-27 MED FILL — HEPARIN SODIUM (PORCINE) 1000 UNIT/ML IJ SOLN: 1000 UNIT/ML | INTRAMUSCULAR | Qty: 20

## 2023-04-27 MED FILL — MIDAZOLAM HCL 2 MG/2ML IJ SOLN: 2 MG/ML | INTRAMUSCULAR | Qty: 2

## 2023-04-27 MED FILL — FENTANYL CITRATE PF 50 MCG/ML IJ SOSY: 50 MCG/ML | INTRAMUSCULAR | Qty: 2

## 2023-04-27 NOTE — H&P (Addendum)
[facility] Cardiology  [address]  [facility] [place] [address]  [phone]    Janet Bentley is a 32 y.o. female presents with atrial fibrillation. She has been anticipating the new mapping system in order to undergo ablation for it. She continues to have intermittent symptoms.       Review of Systems   Constitutional: Negative for fever, chills, diaphoresis, activity change, appetite change, fatigue and unexpected weight change.   Eyes: Negative for photophobia, pain, redness and visual disturbance.   Respiratory: Negative for apnea, cough, chest tightness, shortness of breath, wheezing and stridor.    Cardiovascular: Negative for chest pain, palpitations and leg swelling.   Gastrointestinal: Negative for abdominal distention.   Genitourinary: Negative for dysuria, urgency and frequency.   Musculoskeletal: Negative for myalgias, arthralgias and gait problem.   Skin: Negative for color change, pallor, rash and wound.   Neurological: Negative for dizziness, tremors, speech difficulty, weakness and numbness.   Hematological: Does not bruise/bleed easily.   Psychiatric/Behavioral: Negative.          Social History     Socioeconomic History    Marital status: Single     Spouse name: Not on file    Number of children: Not on file    Years of education: Not on file    Highest education level: Not on file   Occupational History    Not on file   Tobacco Use    Smoking status: Former     Current packs/day: 0.00     Types: Cigarettes     Quit date: 12/07/2020     Years since quitting: 2.3    Smokeless tobacco: Never   Vaping Use    Vaping status: Never Used   Substance and Sexual Activity    Alcohol use: Never    Drug use: Never    Sexual activity: Yes     Partners: Male   Other Topics Concern    Not on file   Social History Narrative    Not on file     Social Determinants of Health     Financial Resource Strain: Not on file   Food Insecurity: Not on file   Transportation Needs: Not on file   Physical Activity: Not on file   Stress: Not  on file   Social Connections: Unknown (06/17/2022)    Received from [employer] [facility]    Family and [employer] Support     Help with Day-to-Day Activities: Not on file     Lonely or Isolated: Not on file   Intimate Partner Violence: Unknown (06/17/2022)    Received from [employer] [facility]    Abuse Screen     Unsafe at Home or Work/School: Not on file     Feels Threatened by Someone?: Not on file     Does Anyone Keep You from Contacting Others or Doint Things Outside the Home?: Not on file     Physical Sign of Abuse Present: Not on file   Housing Stability: Unknown (06/17/2022)    Received from [employer]    Housing Stability     Current Living Arrangements: Not on file     Potentially Unsafe Housing Conditions: Not on file       No Known Allergies    No current outpatient medications on file.    PE:  Vitals:    04/27/23 0921   BP: 123/69   Pulse: 76   Resp:  16   Temp: 97.2 F (36.2 C)   TempSrc: Temporal   SpO2: 100%   Weight: 89.8 kg (198 lb)   Height: 1.727 m (5\' 8" )       Estimated body mass index is 30.11 kg/m as calculated from the following:    Height as of this encounter: 1.727 m (5\' 8" ).    Weight as of this encounter: 89.8 kg (198 lb).    Constitutional: She is oriented to person, place, and time. She appears well-developed and well-nourished in no acute distress.  Neck:  Neck supple without JVD present. No trachea deviation present. No thyromegaly present.   Eyes:Conjunctivae and EOM are normal. Pupils equal and reactive to light.   ZOX:WRUEAVW appears normal.conjunctiva and lids are normal, ears and nose appear normal.  Cardiovascular: Normal rate, S1S2 regular rhythm, normal heart sounds. no murmur ascultated.  No gallop and no friction rub.  no carotid bruits.  no peripheral edema.    Pulmonary/Chest:  Lungs clear to auscultation bilaterally without evidence of respiratory distress.  She  without wheezes. She without rales or ronchi.   Musculoskeletal: Normal range of motion.  Gait is normal  Head is normocephalic and atraumatic.  Skin: Skin is warm and dry without rash or pallor.   Psychiatric:She is alert and oriented to person, place, and time.  She has a normal mood and affect. Her behavior is normal. Thought content normal.       Lab Results   Component Value Date/Time    CREATININE 0.6 04/19/2023 09:13 AM    CREATININE 0.7 09/03/2022 12:15 AM    CREATININE 0.7 06/02/2022 10:59 AM    HGB 13.2 04/19/2023 09:13 AM    HGB 12.5 09/03/2022 12:15 AM    HGB 14.8 06/02/2022 10:59 AM          Assessment, Recommendations, & Plan:  32 y.o. female with PAF. She wishes to undergo ablation attempt and I think this is very reasonable at her age. Her burden is not high and I think anticoagulation would be higher risk than not in her at age 89 with no other risk factors.         Disposition - RTC following the procedure      Please do not hesitate to contact me for any questions or concerns.    Dr. Clovis Pu. Zaidan Keeble  Electrophysiology and Cardiology  [facility], Cardiology  [phone]  Addendum 04/27/2023: Patient seen and examined agree with plan  ASA 2 MP 2

## 2023-04-27 NOTE — Anesthesia Pre-Procedure Evaluation (Signed)
Department of Anesthesiology  Preprocedure Note       Name:  Janet Bentley   Age:  32 y.o.  DOB:  01/24/1991                                          MRN:  [id number]         Date:  04/27/2023      Surgeon: Moishe Spice):  Dorothey Baseman, MD    Procedure: Procedure(s):  Ablation A-fib w complete ep study    Medications prior to admission:   Prior to Admission medications    Medication Sig Start Date End Date Taking? Authorizing Provider   metoprolol tartrate (LOPRESSOR) 50 MG tablet Take 1 tablet by mouth 2 times daily 10/28/22  Yes Dorothey Baseman, MD   metFORMIN (GLUCOPHAGE) 500 MG tablet Take 1 tablet by mouth daily (with breakfast)   Yes [provider]   Loratadine (CLARITIN PO) Take by mouth    [provider]       Current medications:    Current Facility-Administered Medications   Medication Dose Route Frequency Provider Last Rate Last Admin   . sodium chloride flush 0.9 % injection 5-40 mL  5-40 mL IntraVENous 2 times per day Dorothey Baseman, MD       . sodium chloride flush 0.9 % injection 5-40 mL  5-40 mL IntraVENous PRN Dorothey Baseman, MD       . 0.9 % sodium chloride infusion   IntraVENous PRN Dorothey Baseman, MD       . 0.9 % sodium chloride infusion   IntraVENous Continuous Dorothey Baseman, MD 75 mL/hr at 04/27/23 0932 New Bag at 04/27/23 0932       Allergies:  No Known Allergies    Problem List:    Patient Active Problem List   Diagnosis Code   . [redacted] weeks gestation of pregnancy Z3A.32   . Ectopic pregnancy without intrauterine pregnancy O00.90   . Paroxysmal atrial fibrillation (HCC) I48.0       Past Medical History:        Diagnosis Date   . Atrial fibrillation (HCC)    . 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 Insulin resistance    . Mental disorder     anxiety/depression       Past Surgical History:        Procedure Laterality Date   . APPENDECTOMY     . HYSTERECTOMY (CERVIX STATUS UNKNOWN)      Partial       Social History:    Social  History     Tobacco Use   . Smoking status: Former     Current packs/day: 0.00     Types: Cigarettes     Quit date: 12/07/2020     Years since quitting: 2.3   . Smokeless tobacco: Never   Substance Use Topics   . Alcohol use: Never                                Counseling given: Not Answered      Vital Signs (Current):   Vitals:    04/27/23 0921   BP: 123/69   Pulse: 76   Resp: 16   Temp: 97.2 F (36.2  C)   TempSrc: Temporal   SpO2: 100%   Weight: 89.8 kg (198 lb)   Height: 1.727 m (5\' 8" )                                              BP Readings from Last 3 Encounters:   04/27/23 123/69   03/29/23 128/80   01/04/23 122/72       NPO Status:                                                                                 BMI:   Wt Readings from Last 3 Encounters:   04/27/23 89.8 kg (198 lb)   03/29/23 87.5 kg (193 lb)   01/04/23 89.8 kg (198 lb)     Body mass index is 30.11 kg/m.    CBC:   Lab Results   Component Value Date/Time    WBC 5.9 04/19/2023 09:13 AM    RBC 4.22 04/19/2023 09:13 AM    HGB 13.2 04/19/2023 09:13 AM    HCT 38.5 04/19/2023 09:13 AM    MCV 91.2 04/19/2023 09:13 AM    RDW 11.9 04/19/2023 09:13 AM    PLT 240 04/19/2023 09:13 AM       CMP:   Lab Results   Component Value Date/Time    NA 138 04/19/2023 09:13 AM    K 4.2 04/19/2023 09:13 AM    CL 102 04/19/2023 09:13 AM    CO2 24 04/19/2023 09:13 AM    BUN 16 04/19/2023 09:13 AM    CREATININE 0.6 04/19/2023 09:13 AM    GFRAA >59 01/14/2021 12:04 PM    LABGLOM >90 04/19/2023 09:13 AM    LABGLOM >60 09/03/2022 12:15 AM    GLUCOSE 104 04/19/2023 09:13 AM    CALCIUM 9.1 04/19/2023 09:13 AM    BILITOT <0.2 09/03/2022 12:15 AM    ALKPHOS 52 09/03/2022 12:15 AM    AST 17 09/03/2022 12:15 AM    ALT 17 09/03/2022 12:15 AM       POC Tests: No results for input(s): "POCGLU", "POCNA", "POCK", "POCCL", "POCBUN", "POCHEMO", "POCHCT" in the last 72 hours.    Coags:   Lab Results   Component Value Date/Time    APTT 29.0 06/02/2022 10:59 AM       HCG (If Applicable):    Lab Results   Component Value Date    PREGTESTUR Negative 04/27/2023    PREGSERUM Negative 09/03/2022    HCGQUANT 4.6 02/09/2021        ABGs: No results found for: "PHART", "PO2ART", "PCO2ART", "HCO3ART", "BEART", "O2SATART"     Type & Screen (If Applicable):  No results found for: "LABABO"    Drug/Infectious Status (If Applicable):  Lab Results   Component Value Date/Time    HIV Non-reactive 10/03/2018 10:51 AM       COVID-19 Screening (If Applicable):   Lab Results   Component Value Date/Time    COVID19 Not Detected 06/02/2022 11:50 AM           Anesthesia Evaluation  Patient summary  reviewed and Nursing notes reviewed   no history of anesthetic complications:   Airway: Mallampati: II          Dental: normal exam         Pulmonary:normal exam              Patient did not smoke on day of surgery.                 Cardiovascular:    (+) dysrhythmias: atrial fibrillation               ROS comment: Hx of afib- current sinus     Neuro/Psych:   (+) psychiatric history: stable with treatment            GI/Hepatic/Renal: Neg GI/Hepatic/Renal ROS            Endo/Other: Negative Endo/Other ROS                    Abdominal: normal exam            Vascular:          Other Findings:       Anesthesia Plan      general     ASA 2       Induction: intravenous.      Anesthetic plan and risks discussed with patient.    Use of blood products discussed with patient whom.    Plan discussed with CRNA.                Rojelio Brenner, APRN - CRNA   04/27/2023

## 2023-04-27 NOTE — Other (Signed)
RN Susie in room @ [phone]

## 2023-04-27 NOTE — Plan of Care (Signed)
Problem: Discharge Planning  Goal: Discharge to home or other facility with appropriate resources  Outcome: Progressing     Problem: Safety - Adult  Goal: Free from fall injury  Outcome: Progressing

## 2023-04-27 NOTE — Progress Notes (Signed)
Jacia Wingert arrived to room # 718.   Presented with: post ablation  Mental Status: Patient is oriented, alert, coherent, logical, thought processes intact, and able to concentrate and follow conversation.   Vitals:    04/27/23 1441   BP: 114/71   Pulse: 70   Resp: 15   Temp: (!) 96.6 F (35.9 C)   SpO2: 100%     Patient safety contract and falls prevention contract reviewed with patient Yes.  Oriented Patient and Family to room.  Call light within reach. Yes.  Needs, issues or concerns expressed at this time: no.      Electronically signed by Hughie Closs, RN on 04/27/2023 at 2:51 PM

## 2023-04-27 NOTE — Anesthesia Post-Procedure Evaluation (Signed)
Department of Anesthesiology  Postprocedure Note    Patient: Janet Bentley  MRN: [id number]  Birthdate: August 03, 1991  Date of evaluation: 04/27/2023    Procedure Summary       Date: 04/27/23 Room / Location: [facility] EP LAB 2 / [facility]ARD[facility]AC [facility]    Anesthesia Start: 1121 Anesthesia Stop: 1348    Procedures:       Ablation A-fib w complete ep study      Ep cardioversion Diagnosis:       Paroxysmal atrial fibrillation (HCC)      (Paroxysmal atrial fibrillation (HCC) [I48.0])    Providers: Dorothey Baseman, MD Responsible Provider: Cindee Salt, APRN - CRNA    Anesthesia Type: general ASA Status: 2            Anesthesia Type: No value filed.    Aldrete Phase I: Aldrete Score: 10    Aldrete Phase II:      Anesthesia Post Evaluation    Patient location during evaluation: PACU  Patient participation: waiting for patient participation  Level of consciousness: obtunded/minimal responses  Pain score: 0  Airway patency: patent  Nausea & Vomiting: no vomiting and no nausea  Cardiovascular status: hemodynamically stable  Respiratory status: acceptable  Hydration status: stable  Comments: T 97.2  Multimodal analgesia pain management approach  Pain management: adequate    There were no known notable events for this encounter.

## 2023-04-28 LAB — EKG 12-LEAD
P Axis: 50 degrees
P-R Interval: 138 ms
Q-T Interval: 438 ms
QRS Duration: 80 ms
QTc Calculation (Bazett): 438 ms
T Axis: 5 degrees

## 2023-04-28 LAB — POCT GLUCOSE
POC Glucose: 121 mg/dL — ABNORMAL HIGH (ref 70–99)
POC Glucose: 236 mg/dL — ABNORMAL HIGH (ref 70–99)

## 2023-04-28 MED FILL — METOPROLOL TARTRATE 50 MG PO TABS: 50 MG | ORAL | Qty: 1

## 2023-04-28 MED FILL — METFORMIN HCL 500 MG PO TABS: 500 MG | ORAL | Qty: 1

## 2023-04-28 NOTE — Discharge Instructions (Signed)
 POST VENOUS ACCESS/ VENOGRAM/ ABLATION    1.  You will be on bedrest for about 4 hours after your procedure, up around the house and to the bathroom only on the day after your procedure.  2.  You must be transported home by a responsible person after your procedure, YOU CANNOT DRIVE YOURSELF HOME.   3.  Do not drive for 1 WEEK after discharge home.  4.  Check the puncture site after each activity for bleeding or swelling.    5.  If bleeding occurs, apply pressure to site and call 911 or rush to the nearest emergency room (DO NOT drive yourself!).  6.  Resume normal non-strenuous activity 48 hours after discharge home.  7.  Bruising and soreness at the puncture site may occur, this will heal and clear after several weeks.    8.  Keep your leg (with puncture site) mostly straight while sitting or lying for the first 24 hours after your procedure.    10. No heavy lifting or straining (no more than 10 pounds) for 1 WEEK after discharge.          11. Keep the bandage over the puncture site for 24 hours after discharge home.  You may remove the bandage and shower after 24 hours, cover with dry band aid daily until healed.  NO TUB BATH, NO HOT TUB, NO SWIMMING FOR 2 WEEKS.  12.  Once a day for the next 7 days, look at the puncture site, call physician if you notice:  *  red and/or hot at the puncture site  *  bloody drainage, foul-smelling, yellowish or greenish drainage from the puncture site  *  a very large bruise under/arond the puncture site (often firm to touch)  *  numbness, tingling in foot/leg/or loss of motion/sensation in foot or leg

## 2023-04-29 NOTE — Telephone Encounter (Signed)
Patient called stating she had an ablation recently and she knew that Dr. Mayford Knife used blood thinners during the procedure and today she has started her period. She wanted to make sure it wouldn't affect anything. Advised she should be fine and if she notices that she has excessive bleeding or feels weak to call on call cardiology or seek treatment. She voiced understanding.

## 2023-05-02 NOTE — Telephone Encounter (Signed)
Patient called to report that she took her bandage off from her incision today and there was some bleeding. She states it's not gushing blood, its just a small amount. I asked if the adhesive was on incision line when she pulled bandage off to cause bleeding, she stated it could have been. She has just a speck of blood on her bandage now. Advised to hold pressure for 15 minutes and if bleeding doesn't resolve to call back to office. She voiced understanding.

## 2023-05-04 NOTE — Progress Notes (Signed)
 CC:   Chief Complaint   Patient presents with   . Hospital Follow Up Visit     Ablation 04/27/23,  [facility]        History:  Janet Bentley is a 32 y.o. female who presents today for evaluation of the above problems.      HPI  Patient presents today for hospital f/u. She was admitted at [facility] for cardiac ablation for paroxysmal atrial fib. Procedure was on 04/27/23 per Dr. Trudy and patient reports she is doing great with no concerns. Denies any palpitations or chest pain. Has f/u with them next week.   Continuing to take metformin  for hyperglycemia, she is not a diabetic. Reports it works great for her. No episodes of hypoglycemia. SE of constipation and discussed adjusting diet and taking OTC stool softeners to help with that SE.     No Known Allergies  Past Medical History:   Diagnosis Date   . Anxiety    . CHF (congestive heart failure)     See NN to explain pt's response to this DX   . Depression    . GERD (gastroesophageal reflux disease)      Past Surgical History:   Procedure Laterality Date   . APPENDECTOMY     . CARDIAC ABLATION  04/27/2023   . CESAREAN SECTION N/A 02/23/2019    Procedure: CESAREAN SECTION PRIMARY;  Surgeon: Maud Jackquline NOVAK, MD;  Location: [facility] LABOR DELIVERY;  Service: Obstetrics/Gynecology;  Laterality: N/A;   . SALPINGECTOMY Bilateral 03/05/2021    Procedure: LAPAROSCOPIC BILATERAL SALPINGECTOMY; Left oophorectomy;  Surgeon: Devora Sula HERO, MD;  Location: [facility] OR;  Service: Obstetrics/Gynecology;  Laterality: Bilateral;     Family History   Problem Relation Age of Onset   . Hypertension Paternal Grandfather    . Diabetes Paternal Grandfather    . Hypertension Paternal Grandmother    . Diabetes Paternal Grandmother    . Hypertension Maternal Grandmother    . Diabetes Maternal Grandmother    . Diabetes Maternal Grandfather       reports that she has quit smoking. Her smoking use included cigarettes. She started smoking about 12 years ago. She has a 3.2 pack-year smoking  history. She has never used smokeless tobacco. She reports that she does not currently use alcohol. She reports that she does not currently use drugs.      Current Outpatient Medications:   .  metFORMIN  (GLUCOPHAGE ) 500 MG tablet, Take 1 tablet by mouth Daily With Breakfast., Disp: , Rfl:   .  metoprolol  tartrate (LOPRESSOR ) 50 MG tablet, Take 1 tablet by mouth 2 (Two) Times a Day., Disp: , Rfl:     OBJECTIVE:  BP 105/69 (BP Location: Left arm, Patient Position: Sitting, Cuff Size: Adult)   Pulse 62   Temp 97.8 F (36.6 C) (Temporal)   Ht 172.7 cm (68)   Wt 87.1 kg (192 lb)   SpO2 100%   BMI 29.19 kg/m    Estimated body mass index is 29.19 kg/m as calculated from the following:    Height as of this encounter: 172.7 cm (68).    Weight as of this encounter: 87.1 kg (192 lb).                  Physical Exam  Constitutional:       General: She is not in acute distress.     Appearance: Normal appearance.   HENT:      Head: Normocephalic and atraumatic.  Cardiovascular:      Rate and Rhythm: Normal rate and regular rhythm.      Heart sounds: Normal heart sounds.   Pulmonary:      Breath sounds: Normal breath sounds. No wheezing.   Musculoskeletal:         General: Normal range of motion.   Skin:     General: Skin is warm and dry.   Neurological:      Mental Status: She is alert and oriented to person, place, and time.   Psychiatric:         Mood and Affect: Mood normal.         Behavior: Behavior normal.              Assessment/Plan    Diagnoses and all orders for this visit:    1. Hospital discharge follow-up (Primary)    2. Paroxysmal atrial fibrillation  Assessment & Plan:  Doing well post ablation, no concerns  Continue f/u with cardiology on 05/12/2023  If any concerns- f/u      3. High blood sugar    Continue metformin - will do labs at next visit            An After Visit Summary was printed and given to the patient at discharge.  Return in about 2 months (around 07/04/2023), or if symptoms worsen or fail  to improve, for Annual physical.

## 2023-05-04 NOTE — Progress Notes (Signed)
 May 04, 2023    Hello, may I speak with Janet Bentley?    My name is Rollene      I am  with [facility]  [address]  [place] [address]  [phone].    Before we get started may I verify your date of birth? 09/03/1991    I am calling to officially welcome you to our practice and ask about your recent visit. Is this a good time to talk? yes    Tell me about your visit with us . What things went well?  everything went well       We're always looking for ways to make our patients' experiences even better. Do you have recommendations on ways we may improve?  none    Overall were you satisfied with your first visit to our practice?yes       I appreciate you taking the time to speak with me today. Is there anything else I can do for you? no      Thank you, and have a great day.

## 2023-05-12 ENCOUNTER — Ambulatory Visit: Admit: 2023-05-12 | Discharge: 2023-05-12 | Payer: MEDICAID | Attending: Cardiovascular Disease | Primary: Family

## 2023-05-12 VITALS — BP 124/72 | HR 54 | Ht 68.0 in | Wt 196.0 lb

## 2023-05-12 DIAGNOSIS — I480 Paroxysmal atrial fibrillation: Secondary | ICD-10-CM

## 2023-05-12 NOTE — Progress Notes (Signed)
 [facility] Cardiology  [address]  [address]  [facility] [place] [address]  [phone]    Janet Bentley is a 32 y.o. female presents with patient is status post pulmonary vein isolation.  She reports her legs have healed well and she continues to have palpitations but no sustained atrial fibrillation.      Review of Systems   Constitutional: Negative for fever, chills, diaphoresis, activity change, appetite change, fatigue and unexpected weight change.   Eyes: Negative for photophobia, pain, redness and visual disturbance.   Respiratory: Negative for apnea, cough, chest tightness, shortness of breath, wheezing and stridor.    Cardiovascular: Negative for chest pain, palpitations and leg swelling.   Gastrointestinal: Negative for abdominal distention.   Genitourinary: Negative for dysuria, urgency and frequency.   Musculoskeletal: Negative for myalgias, arthralgias and gait problem.   Skin: Negative for color change, pallor, rash and wound.   Neurological: Negative for dizziness, tremors, speech difficulty, weakness and numbness.   Hematological: Does not bruise/bleed easily.   Psychiatric/Behavioral: Negative.          Social History     Socioeconomic History    Marital status: Single     Spouse name: Not on file    Number of children: Not on file    Years of education: Not on file    Highest education level: Not on file   Occupational History    Not on file   Tobacco Use    Smoking status: Former     Current packs/day: 0.00     Types: Cigarettes     Quit date: 12/07/2020     Years since quitting: 2.4    Smokeless tobacco: Never   Vaping Use    Vaping status: Never Used   Substance and Sexual Activity    Alcohol use: Never    Drug use: Never    Sexual activity: Yes     Partners: Male   Other Topics Concern    Not on file   Social History Narrative    Not on file     Social Determinants of Health     Financial Resource Strain: Not on file   Food Insecurity: Not on file   Transportation Needs: Not on file   Physical  Activity: Not on file   Stress: Not on file   Social Connections: Unknown (06/17/2022)    Received from [employer] [facility]    Family and [employer] Support     Help with Day-to-Day Activities: Not on file     Lonely or Isolated: Not on file   Intimate Partner Violence: Unknown (06/17/2022)    Received from [employer] [facility]    Abuse Screen     Unsafe at Home or Work/School: Not on file     Feels Threatened by Someone?: Not on file     Does Anyone Keep You from Contacting Others or Doint Things Outside the Home?: Not on file     Physical Sign of Abuse Present: Not on file   Housing Stability: Unknown (06/17/2022)    Received from [employer]    Housing Stability     Current Living Arrangements: Not on file     Potentially Unsafe Housing Conditions: Not on file       No Known Allergies      Current Outpatient Medications:     metoprolol  tartrate (LOPRESSOR ) 50 MG tablet, Take 1 tablet by mouth 2 times daily,  Disp: 60 tablet, Rfl: 5    metFORMIN  (GLUCOPHAGE ) 500 MG tablet, Take 1 tablet by mouth daily (with breakfast), Disp: , Rfl:     Loratadine (CLARITIN PO), Take by mouth as needed, Disp: , Rfl:     PE:  Vitals:    05/12/23 1015   BP: 124/72   Pulse: 54   Weight: 88.9 kg (196 lb)   Height: 1.727 m (5' 8)       Estimated body mass index is 29.8 kg/m as calculated from the following:    Height as of this encounter: 1.727 m (5' 8).    Weight as of this encounter: 88.9 kg (196 lb).    Constitutional: She is oriented to person, place, and time. She appears well-developed and well-nourished in no acute distress.  Neck:  Neck supple without JVD present. No trachea deviation present. No thyromegaly present.   Eyes:Conjunctivae and EOM are normal. Pupils equal and reactive to light.   ZWU:Yzjmpwh appears normal.conjunctiva and lids are normal, ears and nose appear normal.  Cardiovascular: Normal rate, S1-S2 regular  slow rhythm, normal heart sounds.  No murmur ascultated.  No gallop and no friction rub.  No no carotid bruits.  No peripheral edema.    Pulmonary/Chest:  Lungs clear to auscultation bilaterally without evidence of respiratory distress.  She without wheezes. She without rales or ronchi.   Musculoskeletal: Normal range of motion.  Gait is normal  Head is normocephalic and atraumatic.  Skin: Skin is warm and dry without rash or pallor.   Psychiatric:She is alert and oriented to person, place, and time.  She has a normal mood and affect. Her behavior is normal. Thought content normal.       Lab Results   Component Value Date/Time    CREATININE 0.6 04/19/2023 09:13 AM    CREATININE 0.7 09/03/2022 12:15 AM    CREATININE 0.7 06/02/2022 10:59 AM    HGB 13.2 04/19/2023 09:13 AM    HGB 12.5 09/03/2022 12:15 AM    HGB 14.8 06/02/2022 10:59 AM           ECG 05/12/23    Sinus bradycardia         Assessment, Recommendations, & Plan:  32 y.o. female with atrial fibrillation status post pulmonary vein isolation.  She continues to have palpitations but no atrial fibrillation.  I will see her back in 6 months.  She wishes to stay on the beta-blocker for her PVCs        Disposition - RTC in 6 months or sooner if needed      Please do not hesitate to contact me for any questions or concerns.    Dr. Debby LABOR. Tauni Sanks  Electrophysiology and Cardiology  [facility], Cardiology  [phone]

## 2023-05-16 NOTE — Progress Notes (Signed)
 CC:   Chief Complaint   Patient presents with   . Annual Exam     Not fasting.  Has GYN        History:  Janet Bentley is a 32 y.o. female who presents today for evaluation of the above problems.      HPI  Patient presents today for a wellness visit.  She does report concerns for possibly having fibromyalgia.  She has had intermittent muscle pain ongoing for years.  She did see neurology in the past for back problems with no etiology of her symptoms.  States she has noticed more extreme fatigue and brain fog that is becoming more bothersome.  States her grandmother has fibromyalgia.  Reports pain mainly to muscles and bilateral arms, back, and legs.  Reports tenderness to those muscles.  No injury.  Denies any pain today.    Patient is scheduled for Pap smear this month at [facility] in [place].  Patient just went to cardiologist following cardiac ablation with good report.  She will continue beta-blocker and has follow-up with cardiology in 6 months.  Denies any episodes of A-fib since ablation.    No Known Allergies  Past Medical History:   Diagnosis Date   . Anxiety    . CHF (congestive heart failure)     See NN to explain pt's response to this DX   . Depression    . GERD (gastroesophageal reflux disease)      Past Surgical History:   Procedure Laterality Date   . APPENDECTOMY     . CARDIAC ABLATION  04/27/2023   . CESAREAN SECTION N/A 02/23/2019    Procedure: CESAREAN SECTION PRIMARY;  Surgeon: Maud Jackquline NOVAK, MD;  Location: [facility];  Service: Obstetrics/Gynecology;  Laterality: N/A;   . SALPINGECTOMY Bilateral 03/05/2021    Procedure: LAPAROSCOPIC BILATERAL SALPINGECTOMY; Left oophorectomy;  Surgeon: Devora Sula HERO, MD;  Location: [facility];  Service: Obstetrics/Gynecology;  Laterality: Bilateral;     Family History   Problem Relation Age of Onset   . Hypertension Paternal Grandfather    . Diabetes Paternal Grandfather    . Hypertension Paternal Grandmother    . Diabetes Paternal  Grandmother    . Hypertension Maternal Grandmother    . Diabetes Maternal Grandmother    . Diabetes Maternal Grandfather       reports that she quit smoking about 3 years ago. Her smoking use included cigarettes. She started smoking about 12 years ago. She has a 2.3 pack-year smoking history. She has never used smokeless tobacco. She reports that she does not currently use drugs. She reports that she does not drink alcohol.      Current Outpatient Medications:   .  metFORMIN  (GLUCOPHAGE ) 500 MG tablet, Take 1 tablet by mouth Daily With Breakfast., Disp: , Rfl:   .  metoprolol  tartrate (LOPRESSOR ) 50 MG tablet, Take 1 tablet by mouth 2 (Two) Times a Day., Disp: , Rfl:   .  DULoxetine (CYMBALTA) 30 MG capsule, Take 1 capsule daily x 1 week then increase to 2 capsules daily, Disp: 60 capsule, Rfl: 0    OBJECTIVE:  BP 108/70 (BP Location: Left arm, Patient Position: Sitting, Cuff Size: Adult)   Pulse 75   Temp 98 F (36.7 C) (Temporal)   Resp 18   Ht 165.1 cm (65)   Wt 87.5 kg (193 lb)   LMP 04/28/2023 (Exact Date)   SpO2 99%   BMI 32.12 kg/m    Estimated body  mass index is 32.12 kg/m as calculated from the following:    Height as of this encounter: 165.1 cm (65).    Weight as of this encounter: 87.5 kg (193 lb).                  Physical Exam  Constitutional:       General: She is not in acute distress.     Appearance: Normal appearance.   HENT:      Head: Normocephalic and atraumatic.   Cardiovascular:      Rate and Rhythm: Normal rate and regular rhythm.      Heart sounds: Normal heart sounds.   Pulmonary:      Breath sounds: Normal breath sounds. No wheezing.   Abdominal:      General: Bowel sounds are normal.      Palpations: Abdomen is soft.      Tenderness: There is no abdominal tenderness.   Musculoskeletal:         General: Normal range of motion.      Right upper arm: Tenderness present.      Left upper arm: Tenderness present.      Right forearm: Tenderness present.      Left forearm: Tenderness  present.      Cervical back: Normal range of motion and neck supple. No tenderness.      Thoracic back: No tenderness.      Lumbar back: No tenderness.      Right lower leg: Tenderness present.      Left lower leg: Tenderness present.   Lymphadenopathy:      Cervical: No cervical adenopathy.   Skin:     General: Skin is warm and dry.   Neurological:      Mental Status: She is alert and oriented to person, place, and time.   Psychiatric:         Mood and Affect: Mood normal.         Behavior: Behavior normal.              Assessment/Plan    Diagnoses and all orders for this visit:    1. Wellness examination (Primary)  -     CBC & Differential  -     Comprehensive Metabolic Panel  -     Lipid Panel    2. Myalgia  -     ANA  -     TSH  -     Rheumatoid Factor  -     Sedimentation Rate  -     C-reactive Protein  -     DULoxetine (CYMBALTA) 30 MG capsule; Take 1 capsule daily x 1 week then increase to 2 capsules daily  Dispense: 60 capsule; Refill: 0    3. Screening, lipid  -     Lipid Panel    4. Need for hepatitis C screening test  -     Hepatitis C Antibody    5. Other fatigue  -     ANA  -     TSH  -     Rheumatoid Factor  -     Sedimentation Rate  -     C-reactive Protein    Medication SE discussed. Discussed fibromyalgia. Will do lab work to evaluate symptoms further. She is tender to multiple muscle groups and symptoms are suggestive of Fibromyalgia. Discussed trying Cymbalta to see if improves symptoms. F/u 1 month or sooner.   Has pap scheduled this month. Labs done today (  nonfasting).             An After Visit Summary was printed and given to the patient at discharge.  Return in about 4 weeks (around 06/13/2023) for Recheck myalgias.

## 2023-07-21 MED ORDER — METOPROLOL TARTRATE 50 MG PO TABS
50 | ORAL_TABLET | Freq: Two times a day (BID) | ORAL | 5 refills | 90.00000 days | Status: DC
Start: 2023-07-21 — End: 2024-08-27

## 2023-10-10 NOTE — Progress Notes (Signed)
 CC:   Chief Complaint   Patient presents with   . Annual Exam     With pap        History:  Janet Bentley is a 33 y.o. female who presents today for evaluation of the above problems.      HPI    Patient presents for well woman exam with Pap.  Denies any concerns today.  Last pap about 5 years ago with normal results. Reports paternal grandmother had cervical cancer.     Patient has hx of cardiac ablation, has done well with metoprolol- next appt March 18th with Dr. Mayford Knife at [facility].     No Known Allergies  Past Medical History:   Diagnosis Date   . Anxiety    . CHF (congestive heart failure)     See NN to explain pt's response to this DX   . Depression    . GERD (gastroesophageal reflux disease)      Past Surgical History:   Procedure Laterality Date   . APPENDECTOMY     . CARDIAC ABLATION  04/27/2023   . CESAREAN SECTION N/A 02/23/2019    Procedure: CESAREAN SECTION PRIMARY;  Surgeon: Lamont Snowball, MD;  Location: [facility];  Service: Obstetrics/Gynecology;  Laterality: N/A;   . SALPINGECTOMY Bilateral 03/05/2021    Procedure: LAPAROSCOPIC BILATERAL SALPINGECTOMY; Left oophorectomy;  Surgeon: Carmela Hurt, MD;  Location: [facility];  Service: Obstetrics/Gynecology;  Laterality: Bilateral;     Family History   Problem Relation Age of Onset   . Hypertension Paternal Grandfather    . Diabetes Paternal Grandfather    . Hypertension Paternal Grandmother    . Diabetes Paternal Grandmother    . Hypertension Maternal Grandmother    . Diabetes Maternal Grandmother    . Diabetes Maternal Grandfather       reports that she quit smoking about 4 years ago. Her smoking use included cigarettes. She started smoking about 13 years ago. She has a 3.2 pack-year smoking history. She has never used smokeless tobacco. She reports that she does not currently use alcohol. She reports that she does not currently use drugs.      Current Outpatient Medications:   .  metFORMIN (GLUCOPHAGE) 500 MG tablet, Take 1  tablet by mouth Daily With Breakfast., Disp: 90 tablet, Rfl: 1  .  metoprolol tartrate (LOPRESSOR) 50 MG tablet, Take 1 tablet by mouth 2 (Two) Times a Day., Disp: , Rfl:     OBJECTIVE:  BP 122/74 (BP Location: Left arm, Patient Position: Sitting, Cuff Size: Adult)   Pulse 97   Temp 98.6 F (37 C) (Temporal)   Ht 165.1 cm (65")   Wt 89.1 kg (196 lb 6.4 oz)   SpO2 98%   BMI 32.68 kg/m    Estimated body mass index is 32.68 kg/m as calculated from the following:    Height as of this encounter: 165.1 cm (65").    Weight as of this encounter: 89.1 kg (196 lb 6.4 oz).                  Physical Exam  Vitals reviewed.   Constitutional:       General: She is not in acute distress.     Appearance: Normal appearance.   HENT:      Head: Normocephalic and atraumatic.      Right Ear: Tympanic membrane, ear canal and external ear normal.      Left Ear: Tympanic membrane, ear canal  and external ear normal.      Mouth/Throat:      Mouth: Mucous membranes are moist.      Pharynx: Oropharynx is clear.   Cardiovascular:      Rate and Rhythm: Normal rate and regular rhythm.      Heart sounds: Normal heart sounds.   Pulmonary:      Effort: No respiratory distress.      Breath sounds: Normal breath sounds. No wheezing.   Chest:   Breasts:     Right: Normal. No tenderness.      Left: Mass and tenderness present.       Abdominal:      General: Bowel sounds are normal.      Palpations: Abdomen is soft.      Tenderness: There is no abdominal tenderness.   Genitourinary:     Vagina: Normal.      Cervix: Normal. No cervical motion tenderness or discharge.   Musculoskeletal:         General: Normal range of motion.      Cervical back: Normal range of motion.   Lymphadenopathy:      Cervical: No cervical adenopathy.   Skin:     General: Skin is warm and dry.   Neurological:      Mental Status: She is alert and oriented to person, place, and time.   Psychiatric:         Mood and Affect: Mood normal.         Behavior: Behavior normal.               Assessment/Plan    Diagnoses and all orders for this visit:    1. Well woman exam with routine gynecological exam (Primary)    2. Cervical cancer screening  -     IGP, Aptima HPV, Rfx 16 / 18,45    3. Mass of upper outer quadrant of left breast  -     US Breast Left Complete; Future      Encouraged heart healthy diet and exercise.          An After Visit Summary was printed and given to the patient at discharge.  Return in about 6 months (around 04/08/2024) for Recheck.

## 2023-11-10 ENCOUNTER — Encounter: Payer: Medicare (Managed Care) | Attending: Cardiovascular Disease | Primary: Family

## 2023-11-22 ENCOUNTER — Ambulatory Visit: Admit: 2023-11-22 | Discharge: 2023-11-22 | Payer: MEDICARE | Attending: Cardiovascular Disease | Primary: Family

## 2023-11-22 VITALS — BP 118/80 | HR 58 | Ht 68.0 in | Wt 194.0 lb

## 2023-11-22 DIAGNOSIS — I480 Paroxysmal atrial fibrillation: Secondary | ICD-10-CM

## 2023-11-22 NOTE — Progress Notes (Signed)
 [facility] Cardiology  [address]  [name]  [facility] [place] [address]  [phone]    Janet Bentley is a 33 y.o. female presents with history of paroxysmal atrial fibrillation status post pulmonary vein isolation last year.  She has felt markedly better but now is having some more palpitations.  They are not lasting for longer than a minute but they are bothersome to her.      Review of Systems   Constitutional: Negative for fever, chills, diaphoresis, activity change, appetite change, fatigue and unexpected weight change.   Eyes: Negative for photophobia, pain, redness and visual disturbance.   Respiratory: Negative for apnea, cough, chest tightness, shortness of breath, wheezing and stridor.    Cardiovascular: Negative for chest pain, palpitations and leg swelling.   Gastrointestinal: Negative for abdominal distention.   Genitourinary: Negative for dysuria, urgency and frequency.   Musculoskeletal: Negative for myalgias, arthralgias and gait problem.   Skin: Negative for color change, pallor, rash and wound.   Neurological: Negative for dizziness, tremors, speech difficulty, weakness and numbness.   Hematological: Does not bruise/bleed easily.   Psychiatric/Behavioral: Negative.          Social History     Socioeconomic History    Marital status: Single     Spouse name: Not on file    Number of children: Not on file    Years of education: Not on file    Highest education level: Not on file   Occupational History    Not on file   Tobacco Use    Smoking status: Former     Current packs/day: 0.00     Types: Cigarettes     Quit date: 12/07/2020     Years since quitting: 2.9    Smokeless tobacco: Never   Vaping Use    Vaping status: Never Used   Substance and Sexual Activity    Alcohol use: Never    Drug use: Never    Sexual activity: Yes     Partners: Male   Other Topics Concern    Not on file   Social History Narrative    Not on file     Social Drivers of Health     Financial Resource Strain: Not on file   Food  Insecurity: Not on file   Transportation Needs: Not on file   Physical Activity: Not on file   Stress: Not on file   Social Connections: Unknown (06/17/2022)    Received from [employer] [facility]    Family and [employer] Support     Help with Day-to-Day Activities: Not on file     Lonely or Isolated: Not on file   Intimate Partner Violence: Unknown (06/17/2022)    Received from [employer] [facility]    Abuse Screen     Unsafe at Home or Work/School: Not on file     Feels Threatened by Someone?: Not on file     Does Anyone Keep You from Contacting Others or Doint Things Outside the Home?: Not on file     Physical Sign of Abuse Present: Not on file   Housing Stability: Unknown (06/17/2022)    Received from [employer]    Housing Stability     Current Living Arrangements: Not on file     Potentially Unsafe Housing Conditions: Not on file       No Known Allergies      Current Outpatient Medications:  metoprolol tartrate (LOPRESSOR) 50 MG tablet, TAKE 1 TABLET BY MOUTH TWICE A DAY, Disp: 60 tablet, Rfl: 5    metFORMIN (GLUCOPHAGE) 500 MG tablet, Take 1 tablet by mouth daily (with breakfast), Disp: , Rfl:     Loratadine (CLARITIN PO), Take by mouth as needed, Disp: , Rfl:     PE:  Vitals:    11/22/23 1137   BP: 118/80   Pulse: 58   Weight: 88 kg (194 lb)   Height: 1.727 m (5\' 8" )       Estimated body mass index is 29.5 kg/m as calculated from the following:    Height as of this encounter: 1.727 m (5\' 8" ).    Weight as of this encounter: 88 kg (194 lb).    Constitutional: She is oriented to person, place, and time. She appears well-developed and well-nourished in no acute distress.  Neck:  Neck supple without JVD present. No trachea deviation present. No thyromegaly present.   Eyes:Conjunctivae and EOM are normal. Pupils equal and reactive to light.   NWG:NFAOZHY appears normal.conjunctiva and lids are normal, ears  and nose appear normal.  Cardiovascular: Normal rate, S1-S2 regular rhythm, normal heart sounds.  No murmur ascultated.  No gallop and no friction rub.  No carotid bruits.  No peripheral edema.    Pulmonary/Chest:  Lungs clear to auscultation bilaterally without evidence of respiratory distress.  She without wheezes. She without rales or ronchi.   Musculoskeletal: Normal range of motion.  Gait is normal  Head is normocephalic and atraumatic.  Skin: Skin is warm and dry without rash or pallor.   Psychiatric:She is alert and oriented to person, place, and time.  She has a normal mood and affect. Her behavior is normal. Thought content normal.       Lab Results   Component Value Date/Time    CREATININE 0.6 04/19/2023 09:13 AM    CREATININE 0.7 09/03/2022 12:15 AM    CREATININE 0.7 06/02/2022 10:59 AM    HGB 13.2 04/19/2023 09:13 AM    HGB 12.5 09/03/2022 12:15 AM    HGB 14.8 06/02/2022 10:59 AM            Assessment, Recommendations, & Plan:  33 y.o. female with palpitations.  We need to investigate whether this is return of her atrial fibrillation.  I will place a 1 week ZIO monitor on her as she is having these episodes once or twice a week.  Her resting heart rate may be prohibitive of increasing her metoprolol further but she may benefit from a change in medication.  These decisions will be pending the results of her study.        Disposition - RTC in 2 to 3 weeks    Please do not hesitate to contact me for any questions or concerns.    Dr. Clovis Pu. Marji Kuehnel  Electrophysiology and Cardiology  [facility], Cardiology  [phone]

## 2023-12-06 ENCOUNTER — Encounter

## 2023-12-13 ENCOUNTER — Ambulatory Visit: Admit: 2023-12-13 | Discharge: 2023-12-13 | Payer: MEDICAID | Attending: Cardiovascular Disease | Primary: Family

## 2023-12-13 VITALS — BP 122/72 | HR 62 | Ht 68.0 in | Wt 194.0 lb

## 2023-12-13 DIAGNOSIS — I480 Paroxysmal atrial fibrillation: Secondary | ICD-10-CM

## 2023-12-13 NOTE — Progress Notes (Signed)
 [facility]  [address]  [name]  [facility] [place] [address]  [phone]    Janet Bentley is a 33 y.o. female presents with history of atrial fibrillation status post pulmonary vein isolation.  She was having palpitations again so we repeated a weeklong monitor.  Happily that was normal and showed only some premature beats.      Review of Systems   Constitutional: Negative for fever, chills, diaphoresis, activity change, appetite change, fatigue and unexpected weight change.   Eyes: Negative for photophobia, pain, redness and visual disturbance.   Respiratory: Negative for apnea, cough, chest tightness, shortness of breath, wheezing and stridor.    Cardiovascular: Negative for chest pain, palpitations and leg swelling.   Gastrointestinal: Negative for abdominal distention.   Genitourinary: Negative for dysuria, urgency and frequency.   Musculoskeletal: Negative for myalgias, arthralgias and gait problem.   Skin: Negative for color change, pallor, rash and wound.   Neurological: Negative for dizziness, tremors, speech difficulty, weakness and numbness.   Hematological: Does not bruise/bleed easily.   Psychiatric/Behavioral: Negative.          Social History     Socioeconomic History    Marital status: Single     Spouse name: Not on file    Number of children: Not on file    Years of education: Not on file    Highest education level: Not on file   Occupational History    Not on file   Tobacco Use    Smoking status: Former     Current packs/day: 0.00     Types: Cigarettes     Quit date: 12/07/2020     Years since quitting: 3.0    Smokeless tobacco: Never   Vaping Use    Vaping status: Never Used   Substance and Sexual Activity    Alcohol use: Never    Drug use: Never    Sexual activity: Yes     Partners: Male   Other Topics Concern    Not on file   Social History Narrative    Not on file     Social Drivers of Health     Financial Resource Strain: Not on file   Food Insecurity: Not on file   Transportation  Needs: Not on file   Physical Activity: Not on file   Stress: Not on file   Social Connections: Unknown (06/17/2022)    Received from [employer] [employer] [facility]    Family and [employer] Support     Help with Day-to-Day Activities: Not on file     Lonely or Isolated: Not on file   Intimate Partner Violence: Unknown (06/17/2022)    Received from [employer] [facility]    Abuse Screen     Unsafe at Home or Work/School: Not on file     Feels Threatened by Someone?: Not on file     Does Anyone Keep You from Contacting Others or Doint Things Outside the Home?: Not on file     Physical Sign of Abuse Present: Not on file   Housing Stability: Unknown (06/17/2022)    Received from [employer]    Housing Stability     Current Living Arrangements: Not on file     Potentially Unsafe Housing Conditions: Not on file       No Known Allergies      Current Outpatient Medications:     metoprolol tartrate (LOPRESSOR) 50 MG tablet, TAKE 1  TABLET BY MOUTH TWICE A DAY, Disp: 60 tablet, Rfl: 5    metFORMIN (GLUCOPHAGE) 500 MG tablet, Take 1 tablet by mouth daily (with breakfast), Disp: , Rfl:     Loratadine (CLARITIN PO), Take by mouth as needed, Disp: , Rfl:     PE:  Vitals:    12/13/23 1248   BP: 122/72   Pulse: 62   Weight: 88 kg (194 lb)   Height: 1.727 m (5\' 8" )       Estimated body mass index is 29.5 kg/m as calculated from the following:    Height as of this encounter: 1.727 m (5\' 8" ).    Weight as of this encounter: 88 kg (194 lb).    Constitutional: She is oriented to person, place, and time. She appears well-developed and well-nourished in no acute distress.  Neck:  Neck supple without JVD present. No trachea deviation present. No thyromegaly present.   Eyes:Conjunctivae and EOM are normal. Pupils equal and reactive to light.   YNW:GNFAOZH appears normal.conjunctiva and lids are normal, ears and nose appear normal.  Cardiovascular:  Normal rate, S1-S2 regular rhythm, normal heart sounds.  No murmur ascultated.  No gallop and no friction rub.  No carotid bruits.  No peripheral edema.    Pulmonary/Chest:  Lungs clear to auscultation bilaterally without evidence of respiratory distress.  She without wheezes. She without rales or ronchi.   Musculoskeletal: Normal range of motion.  Gait is normal  Head is normocephalic and atraumatic.  Skin: Skin is warm and dry without rash or pallor.   Psychiatric:She is alert and oriented to person, place, and time.  She has a normal mood and affect. Her behavior is normal. Thought content normal.       Lab Results   Component Value Date/Time    CREATININE 0.6 04/19/2023 09:13 AM    CREATININE 0.7 09/03/2022 12:15 AM    CREATININE 0.7 06/02/2022 10:59 AM    HGB 13.2 04/19/2023 09:13 AM    HGB 12.5 09/03/2022 12:15 AM    HGB 14.8 06/02/2022 10:59 AM         Monitor reviewed and reveals sinus rhythm overall normal with nocturnal bradycardia     Assessment, Recommendations, & Plan:  33 y.o. female with palpitations status post pulmonary vein isolation.  She has had no return of atrial fibrillation.  Overall she feels well.  She does not wish to increase her beta-blockade because of nocturnal bradycardia and resting heart rates in the 60s.        Disposition - RTC in 12 months or sooner if needed      Please do not hesitate to contact me for any questions or concerns.    Dr. Clovis Pu. Tulip Meharg  Electrophysiology and Cardiology  [facility], Cardiology  [phone]

## 2023-12-16 LAB — HEMOGLOBIN A1C: Hemoglobin A1C, External: 5.3 % (ref 4.8–5.6)

## 2024-05-18 NOTE — Telephone Encounter (Addendum)
 05/18/2024 Spoke with patient and let her know that we still have not received referral.  I gave her fax number so she can check that referring provider is faxing to right number.  Let patient know once received that we would call her to schedule appointment.  dmw

## 2024-05-18 NOTE — Telephone Encounter (Signed)
 Patient called to check if office received referral from Marien Munch, APRN for Gallbladder. Please call patient when referral has been received.    Thank you!

## 2024-05-22 ENCOUNTER — Encounter

## 2024-05-22 ENCOUNTER — Ambulatory Visit: Admit: 2024-05-22 | Discharge: 2024-05-22 | Payer: Medicaid (Managed Care) | Attending: Surgery | Primary: Family

## 2024-05-22 NOTE — H&P (View-Only) (Signed)
 SUBJECTIVE:  Janet Bentley is a 33 y.o. female who presents today with abdominal pain that is occurring more frequently. She feels nauseous.  She has right upper abdominal pain that radiates to her back.  She had recurrence of symptoms with her HIDA scan.      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    Insulin resistance     Mental disorder     anxiety/depression     Past Surgical History:   Procedure Laterality Date    APPENDECTOMY      EP DEVICE PROCEDURE N/A 04/27/2023    Ablation A-fib w complete ep study performed by Trudy Debby LABOR, MD at [facility] CARDIAC CATH LAB    EP DEVICE PROCEDURE N/A 04/27/2023    Ep cardioversion performed by Trudy Debby LABOR, MD at [facility] CARDIAC CATH LAB    HYSTERECTOMY (CERVIX STATUS UNKNOWN)      Partial     Current Outpatient Medications   Medication Sig Dispense Refill    metoprolol  tartrate (LOPRESSOR ) 50 MG tablet TAKE 1 TABLET BY MOUTH TWICE A DAY 60 tablet 5    metFORMIN  (GLUCOPHAGE ) 500 MG tablet Take 1 tablet by mouth daily (with breakfast)      Loratadine (CLARITIN PO) Take by mouth as needed       No current facility-administered medications for this visit.     Allergies: Patient has no known allergies.    Family History   Problem Relation Age of Onset    Diabetes Paternal Aunt     Diabetes Maternal Grandmother     Heart Attack Paternal Grandmother     Diabetes Paternal Grandmother     Heart Disease Paternal Grandmother     Diabetes Paternal Grandfather     Heart Attack Paternal Grandfather     Heart Disease Paternal Grandfather        Social History     Tobacco Use    Smoking status: Former     Current packs/day: 0.00     Types: Cigarettes     Quit date: 12/07/2020     Years since quitting: 3.4    Smokeless tobacco: Never   Substance Use Topics    Alcohol use: Never       Review of Systems   All other systems reviewed and are negative.      OBJECTIVE:  Pulse 80    Temp 98 F (36.7 C) (Temporal)   Ht 1.727 m (5' 8)   Wt 87.5 kg (193 lb)   SpO2 99%   BMI 29.35 kg/m   Physical Exam  Constitutional:       General: She is not in acute distress.     Appearance: Normal appearance. She is normal weight. She is not ill-appearing.   Cardiovascular:      Rate and Rhythm: Normal rate.   Pulmonary:      Effort: Pulmonary effort is normal.   Abdominal:      General: There is no distension.      Palpations: Abdomen is soft.   Neurological:      Mental Status: She is alert.             ASSESSMENT:   Diagnosis Orders   1. Biliary dyskinesia            PLAN:  Orders Placed  This Encounter   Procedures    Initiate PAT Protocol     No orders of the defined types were placed in this encounter.    Reviewed PCP note.    Reviewed biliary pathophysiology with the patient who notes understanding.     The risks, benefits, and alternatives were discussed with the pt. She is willing to accept the risks and proceed with a Robotic Assisted Laparoscopic Cholecystectomy. The surgical risks included but not limited to bleeding, infection, perforation, bile leak, injury to surrounding structures, risk of needing further surgery, etc. The anesthetic risks included heart attacks, strokes, pneumonia, phlebitis, etc.  She is willing to accept all risks and proceed with surgery. No guarantees have been given.     Return for Post-operative Visit.    [place] Festa, Mor[place] 05/22/2024 2:46 PM

## 2024-05-22 NOTE — Progress Notes (Signed)
 SUBJECTIVE:  [name] is a 33 y.o. female who presents today with abdominal pain that is occurring more frequently. She feels nauseous.  She has right upper abdominal pain that radiates to her back.  She had recurrence of symptoms with her HIDA scan.      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    Insulin resistance     Mental disorder     anxiety/depression     Past Surgical History:   Procedure Laterality Date    APPENDECTOMY      EP DEVICE PROCEDURE N/A 04/27/2023    Ablation A-fib w complete ep study performed by Janet Debby LABOR, MD at [facility] CARDIAC CATH LAB    EP DEVICE PROCEDURE N/A 04/27/2023    Ep cardioversion performed by Janet Debby LABOR, MD at [facility] CARDIAC CATH LAB    HYSTERECTOMY (CERVIX STATUS UNKNOWN)      Partial     Current Outpatient Medications   Medication Sig Dispense Refill    metoprolol  tartrate (LOPRESSOR ) 50 MG tablet TAKE 1 TABLET BY MOUTH TWICE A DAY 60 tablet 5    metFORMIN  (GLUCOPHAGE ) 500 MG tablet Take 1 tablet by mouth daily (with breakfast)      Loratadine (CLARITIN PO) Take by mouth as needed       No current facility-administered medications for this visit.     Allergies: Patient has no known allergies.    Family History   Problem Relation Age of Onset    Diabetes Paternal Aunt     Diabetes Maternal Grandmother     Heart Attack Paternal Grandmother     Diabetes Paternal Grandmother     Heart Disease Paternal Grandmother     Diabetes Paternal Grandfather     Heart Attack Paternal Grandfather     Heart Disease Paternal Grandfather        Social History     Tobacco Use    Smoking status: Former     Current packs/day: 0.00     Types: Cigarettes     Quit date: 12/07/2020     Years since quitting: 3.4    Smokeless tobacco: Never   Substance Use Topics    Alcohol use: Never       Review of Systems   All other systems reviewed and are negative.      OBJECTIVE:  Pulse 80    Temp 98 F (36.7 C) (Temporal)   Ht 1.727 m (5' 8)   Wt 87.5 kg (193 lb)   SpO2 99%   BMI 29.35 kg/m   Physical Exam  Constitutional:       General: She is not in acute distress.     Appearance: Normal appearance. She is normal weight. She is not ill-appearing.   Cardiovascular:      Rate and Rhythm: Normal rate.   Pulmonary:      Effort: Pulmonary effort is normal.   Abdominal:      General: There is no distension.      Palpations: Abdomen is soft.   Neurological:      Mental Status: She is alert.             ASSESSMENT:   Diagnosis Orders   1. Biliary dyskinesia            PLAN:  Orders Placed  This Encounter   Procedures    Initiate PAT Protocol     No orders of the defined types were placed in this encounter.    Reviewed PCP note.    Reviewed biliary pathophysiology with the patient who notes understanding.     The risks, benefits, and alternatives were discussed with the pt. She is willing to accept the risks and proceed with a Robotic Assisted Laparoscopic Cholecystectomy. The surgical risks included but not limited to bleeding, infection, perforation, bile leak, injury to surrounding structures, risk of needing further surgery, etc. The anesthetic risks included heart attacks, strokes, pneumonia, phlebitis, etc.  She is willing to accept all risks and proceed with surgery. No guarantees have been given.     Return for Post-operative Visit.    [place] Festa, Morley 05/22/2024 2:46 PM

## 2024-05-25 ENCOUNTER — Inpatient Hospital Stay: Admit: 2024-05-25 | Payer: Medicaid (Managed Care) | Primary: Family

## 2024-05-25 VITALS — Ht 68.0 in | Wt 190.0 lb

## 2024-05-25 DIAGNOSIS — Z01818 Encounter for other preprocedural examination: Principal | ICD-10-CM

## 2024-05-25 NOTE — Discharge Instructions (Signed)
 The day before surgery you will receive a phone call from the surgery nurse to let you know what time to arrive on the day of surgery. This call will usually be between 330 and 4 PM. If you do not receive a phone call by 4 PM the day before your surgery please call [phone] and let them know you have not received an arrival time. If your surgery is on Monday, your call will be on the Friday before your Monday surgery. Please check your voicemail as they may leave a message with that information.  If you are running late or wake up sick the day of surgery please call the above number for further instructions.    Chlorhexidine Gluconate 4% Solution    Patient should shower with this soap a minimum of 3 consecutive showers (2 nights before surgery, the night before surgery and the morning of surgery) washing from the neck down (avoiding contact with genitalia).      DO NOT WASH YOUR HAIR OR FACE WITH THIS SOAP.  When washing with this soap, apply enough to suds up the body thoroughly, turn the water away from your body and allow the soap suds to remain on the body for 2 full minutes, then rinse body completely.      After using this soap on the body, please do not apply powders or lotions to your body.  After the shower the night before surgery, please dry off with a clean towel, sleep in freshly laundered pj's, and change your bed linen before going to sleep.      IF YOU HAVE A PET IN [facility], please do not allow your pet to sleep in the bed with you after you have showered with your surgery prep soap.     Please remember that it is not recommended to allow your pet to sleep with you post op, until your incision has healed.  This can increase your risk of post op infection.    The morning of surgery, you may take all your prescribed medications with a sip of water unless instructed not to take.  Any exceptions to this would be listed below:  Please take your metoprolol  the morning of surgery  Do not take  Metformin  the morning of surgery    PREOPERATIVE GUIDELINES WHEN RECEIVING ANESTHESIA    Do not eat anything after midnight the night before your surgery. You may have water up to 2 hours before your arrival time. No gum or candy the morning of surgery.  This is extremely important for your safety.    Take a bath (or shower) the night before your surgery and you may brush your teeth the morning of your surgery.    Morning of surgery no Nicotine products including smoking, vaping, patches, dip or snuff.     You will be scheduled to arrive at the hospital 2 hours before your surgery, or follow your surgeon's instructions.    Dress comfortably.  Wear loose clothing that will be easy to remove and comfortable for your trip home.    You may wear eyeglasses but bring your cases with you as they must be remove before your surgery. If you wear contacts they will have to be removed before your surgery.    Hearing aids and dentures will need to be removed before your surgery. If you wear dentures, do not glue them in the morning of surgery.     Do not wear any jewelry, including body jewelry.  All jewelry will need to be removed prior to your surgery. This includes wedding rings. Any metal touching your body can cause a burn or may have to be cut off due to swelling or injury.    Do not wear fingernail polish or make-up.    It is best not to bring any valuables with you.    If you are to stay in the hospital overnight, bring your robe, slippers and personal toiletries that you may need.    POSTOPERATIVE GUIDELINES AFTER RECEIVING ANESTHESIA    If you are to go home after your surgery, you will need a responsible adult to drive you home.     You will not be able to take public transportation after your discharge from the Operative Care Unit unless you are accompanied by a        responsible adult.    On returning home, be sure to follow your physician's orders regarding diet, activity and medications.    Remember, surgery with  general anesthesia or sedation may leave you sleepy, very tired and with a decreased appetite for 12 to 24 hours.    If you develop any post-surgical complications or problems, call your surgeon or [facility] Emergency Department [phone]).  [facility] Visitor Policy for Surgery Patients-Revised 02-18-2021    Visitors for surgery patients are essential for the patient's emotional well-being and care       post operatively.    2.   Visitor Expectations and Limitations    3.  One visitor allowed with patients in the preop/postop rooms.    4.  A second visitor may sit in the waiting area.    5.  No children under 13 allowed in the pre-post op areas unless they are the patient.    6.  Two people may be with an underage surgical/procedural patient in preop/postop        room.      7.  If you are admitted to the hospital post operatively, there are NO RESTRICTIONS on       the floor at this time.      8.  If you are admitted to ICU postoperatively, you may have one visitor in the room from        7A-7P.  A second visitor may sit in the ICU waiting room.  No overnight visitors in         ICU waiting room.

## 2024-05-28 LAB — EKG 12-LEAD
P Axis: 34 degrees
P-R Interval: 112 ms
Q-T Interval: 442 ms
QRS Duration: 80 ms
QTc Calculation (Bazett): 414 ms
T Axis: 29 degrees

## 2024-06-04 ENCOUNTER — Inpatient Hospital Stay: Payer: Medicaid (Managed Care)

## 2024-06-04 LAB — POCT GLUCOSE: POC Glucose: 107 mg/dL — ABNORMAL HIGH (ref 70–99)

## 2024-06-04 LAB — POC PREGNANCY UR-QUAL
HCG, Urine, POC: NEGATIVE
Lot Number: 955245

## 2024-06-04 MED ORDER — NORMAL SALINE FLUSH 0.9 % IV SOLN
0.9 | INTRAVENOUS | Status: DC | PRN
Start: 2024-06-04 — End: 2024-06-04

## 2024-06-04 MED ORDER — HYDROMORPHONE HCL 1 MG/ML IJ SOLN
1 | Freq: Once | INTRAMUSCULAR | Status: DC | PRN
Start: 2024-06-04 — End: 2024-06-04
  Administered 2024-06-04: 19:00:00 1 via INTRAVENOUS

## 2024-06-04 MED ORDER — NORMAL SALINE FLUSH 0.9 % IV SOLN
0.9 | Freq: Two times a day (BID) | INTRAVENOUS | Status: DC
Start: 2024-06-04 — End: 2024-06-04

## 2024-06-04 MED ORDER — SUGAMMADEX SODIUM 500 MG/5ML IV SOLN
500 | Freq: Once | INTRAVENOUS | Status: DC | PRN
Start: 2024-06-04 — End: 2024-06-04
  Administered 2024-06-04: 19:00:00 350 via INTRAVENOUS

## 2024-06-04 MED ORDER — ROCURONIUM BROMIDE 100 MG/10ML IV SOLN
100 | Freq: Once | INTRAVENOUS | Status: DC | PRN
Start: 2024-06-04 — End: 2024-06-04
  Administered 2024-06-04: 18:00:00 50 via INTRAVENOUS

## 2024-06-04 MED ORDER — SODIUM CHLORIDE 0.9 % IV SOLN
0.9 | INTRAVENOUS | Status: DC | PRN
Start: 2024-06-04 — End: 2024-06-04

## 2024-06-04 MED ORDER — DEXAMETHASONE SOD PHOSPHATE PF 10 MG/ML IJ SOLN
10 | Freq: Once | INTRAMUSCULAR | Status: DC
Start: 2024-06-04 — End: 2024-06-04

## 2024-06-04 MED ORDER — DEXAMETHASONE SOD PHOSPHATE PF 10 MG/ML IJ SOLN
10 | Freq: Once | INTRAMUSCULAR | Status: DC | PRN
Start: 2024-06-04 — End: 2024-06-04
  Administered 2024-06-04: 19:00:00 10 via INTRAVENOUS

## 2024-06-04 MED ORDER — HYDROMORPHONE HCL 1 MG/ML IJ SOLN
1 | INTRAMUSCULAR | Status: DC | PRN
Start: 2024-06-04 — End: 2024-06-04

## 2024-06-04 MED ORDER — ACETAMINOPHEN 500 MG PO TABS
500 | Freq: Once | ORAL | Status: AC
Start: 2024-06-04 — End: 2024-06-04
  Administered 2024-06-04: 16:00:00 1000 mg via ORAL

## 2024-06-04 MED ORDER — HYDROMORPHONE HCL 1 MG/ML IJ SOLN
1 | INTRAMUSCULAR | Status: AC
Start: 2024-06-04 — End: 2024-06-04

## 2024-06-04 MED ORDER — FENTANYL CITRATE (PF) 100 MCG/2ML IJ SOLN
100 | Freq: Once | INTRAMUSCULAR | Status: DC | PRN
Start: 2024-06-04 — End: 2024-06-04
  Administered 2024-06-04 (×2): 50 via INTRAVENOUS

## 2024-06-04 MED ORDER — MIDAZOLAM HCL 2 MG/2ML IJ SOLN
2 | INTRAMUSCULAR | Status: AC
Start: 2024-06-04 — End: 2024-06-04

## 2024-06-04 MED ORDER — OXYCODONE-ACETAMINOPHEN 5-325 MG PO TABS
5-325 | ORAL_TABLET | Freq: Four times a day (QID) | ORAL | 0 refills | 5.00000 days | Status: AC | PRN
Start: 2024-06-04 — End: 2024-06-07
  Filled 2024-06-04: qty 12, 3d supply, fill #0

## 2024-06-04 MED ORDER — CELECOXIB 200 MG PO CAPS
200 | Freq: Once | ORAL | Status: AC
Start: 2024-06-04 — End: 2024-06-04
  Administered 2024-06-04: 16:00:00 200 mg via ORAL

## 2024-06-04 MED ORDER — CEFAZOLIN SODIUM 1 G IJ SOLR
1 | INTRAMUSCULAR | Status: AC
Start: 2024-06-04 — End: 2024-06-04
  Administered 2024-06-04: 19:00:00 2000 mg via INTRAVENOUS

## 2024-06-04 MED ORDER — SODIUM CHLORIDE (PF) 0.9 % IJ SOLN
0.9 | Freq: Once | INTRAMUSCULAR | Status: AC
Start: 2024-06-04 — End: 2024-06-04
  Administered 2024-06-04: 16:00:00 20 mg via INTRAVENOUS

## 2024-06-04 MED ORDER — LACTATED RINGERS IV SOLN
INTRAVENOUS | Status: DC
Start: 2024-06-04 — End: 2024-06-04

## 2024-06-04 MED ORDER — LIDOCAINE HCL 1 % IJ SOLN
1 | Freq: Once | INTRAMUSCULAR | Status: DC | PRN
Start: 2024-06-04 — End: 2024-06-04
  Administered 2024-06-04: 18:00:00 50 via INTRAVENOUS

## 2024-06-04 MED ORDER — ONDANSETRON HCL 4 MG/2ML IJ SOLN
4 | Freq: Once | INTRAMUSCULAR | Status: DC | PRN
Start: 2024-06-04 — End: 2024-06-04

## 2024-06-04 MED ORDER — FENTANYL CITRATE (PF) 100 MCG/2ML IJ SOLN
100 | INTRAMUSCULAR | Status: AC
Start: 2024-06-04 — End: 2024-06-04

## 2024-06-04 MED ORDER — OXYCODONE-ACETAMINOPHEN 5-325 MG PO TABS
5-325 | Freq: Once | ORAL | Status: AC
Start: 2024-06-04 — End: 2024-06-04
  Administered 2024-06-04: 21:00:00 1 via ORAL

## 2024-06-04 MED ORDER — SODIUM CHLORIDE (PF) 0.9 % IJ SOLN
0.9 | INTRAMUSCULAR | Status: DC | PRN
Start: 2024-06-04 — End: 2024-06-04

## 2024-06-04 MED ORDER — ONDANSETRON HCL 4 MG/2ML IJ SOLN
4 | Freq: Once | INTRAMUSCULAR | Status: DC | PRN
Start: 2024-06-04 — End: 2024-06-04
  Administered 2024-06-04: 19:00:00 4 via INTRAVENOUS

## 2024-06-04 MED ORDER — LACTATED RINGERS IV SOLN
INTRAVENOUS | Status: DC
Start: 2024-06-04 — End: 2024-06-04
  Administered 2024-06-04 (×2): via INTRAVENOUS

## 2024-06-04 MED ORDER — MIDAZOLAM HCL 2 MG/2ML IJ SOLN
2 | Freq: Once | INTRAMUSCULAR | Status: DC | PRN
Start: 2024-06-04 — End: 2024-06-04
  Administered 2024-06-04: 18:00:00 2 via INTRAVENOUS

## 2024-06-04 MED ORDER — BUPIVACAINE HCL 0.25 % IJ SOLN
0.25 | INTRAMUSCULAR | Status: DC | PRN
Start: 2024-06-04 — End: 2024-06-04
  Administered 2024-06-04: 19:00:00 50

## 2024-06-04 MED ORDER — INDOCYANINE GREEN 25 MG IV SOLR
25 | INTRAVENOUS | Status: DC | PRN
Start: 2024-06-04 — End: 2024-06-04
  Administered 2024-06-04: 18:00:00 7.5 via INTRAVENOUS

## 2024-06-04 MED ORDER — BUPIVACAINE LIPOSOME 1.3 % IJ SUSP
1.3 | INTRAMUSCULAR | Status: DC | PRN
Start: 2024-06-04 — End: 2024-06-04
  Administered 2024-06-04: 19:00:00 50

## 2024-06-04 MED ORDER — PROPOFOL 200 MG/20ML IV EMUL
200 | Freq: Once | INTRAVENOUS | Status: DC | PRN
Start: 2024-06-04 — End: 2024-06-04
  Administered 2024-06-04: 18:00:00 150 via INTRAVENOUS

## 2024-06-04 MED FILL — CELECOXIB 200 MG PO CAPS: 200 mg | ORAL | Qty: 1 | Fill #0

## 2024-06-04 MED FILL — MIDAZOLAM HCL 2 MG/2ML IJ SOLN: 2 mg/mL | INTRAMUSCULAR | Qty: 2 | Fill #0

## 2024-06-04 MED FILL — DILAUDID 1 MG/ML IJ SOLN: 1 mg/mL | INTRAMUSCULAR | Qty: 1 | Fill #0

## 2024-06-04 MED FILL — OXYCODONE-ACETAMINOPHEN 5-325 MG PO TABS: 5-325 mg | ORAL | Qty: 1 | Fill #0

## 2024-06-04 MED FILL — FENTANYL CITRATE (PF) 100 MCG/2ML IJ SOLN: 100 MCG/2ML | INTRAMUSCULAR | Qty: 4 | Fill #0

## 2024-06-04 MED FILL — FAMOTIDINE (PF) 20 MG/2ML IV SOLN: 20 MG/2ML | INTRAVENOUS | Qty: 2 | Fill #0

## 2024-06-04 MED FILL — ACETAMINOPHEN EXTRA STRENGTH 500 MG PO TABS: 500 mg | ORAL | Qty: 2 | Fill #0

## 2024-06-04 NOTE — Discharge Instructions (Signed)
 No heavy lifting.  May shower tomorrow.   Do not soak in tub.   Do not drive while on pain medications.      Avoid constipation.   Take colace nightly (up to 300 mg) and miralax daily (up to three doses).   Drink 64 ounces of water and take a powdered fiber supplement daily (ie-Metamucil).      If you have signs of infection, call you doctor. These include:   -Increased pain, swelling, warmth, or redness  -Red streaks leading from the incision  -Pus draining from the incision  -A fever > 100.4

## 2024-06-04 NOTE — Op Note (Signed)
 Operative Note      Patient: Janet Bentley  Date of Birth: 1990-09-11  MRN: [id number]    Date of Procedure: 12-Jun-2024    Pre-Op Diagnosis Codes:      * Biliary dyskinesia [K82.8]    Post-Op Diagnosis: Same       Procedure(s):  ROBOTIC ASSISTED LAPAROSCOPIC CHOLECYSTECTOMY    Surgeon(s):  Naftali Carchi A, DO    Assistant:   First Assistant: Lemond Geni HERO, RN    Anesthesia: General    Estimated Blood Loss (mL): Minimal    Complications: None    Specimens:   ID Type Source Tests Collected by Time Destination   A : Gallbladder Tissue Gallbladder SURGICAL PATHOLOGY Verma Sharyne LABOR, DO 2024-06-12 1507        Implants:  Implant Name Type Inv. Item Serial No. Manufacturer Lot No. LRB No. Used Action   CLIP INT L POLYMER LOK LIG HEM O LOK (6EA/PK) - [id number]  CLIP INT L POLYMER LOK LIG HEM O LOK (6EA/PK)  [employer] [id number] N/A 1 Implanted         Drains: * No LDAs found *    Findings:  Present At Time Of Surgery (PATOS) (choose all levels that have infection present):  No infection present  Other Findings: critical view of safety obtained.    Detailed Description of Procedure:   INDICATIONS  Janet Bentley presented to the office with abdominal pain. she was found to have biliary dyskinesia and has elected for cholecystectomy.    DESCRIPTION OF PROCEDURE  Janet Bentley was taken to the main operating room and placed on the operating table supine.  After the induction of adequate general endotracheal anesthesia the abdomen was prepped in the usual sterile fashion.  Time out performed.     Local anesthestic was administered to the inferior portion of the umbilicus, and incision was made.  Disection was performed to the fasica and a 8 mm robotic trocar was inserted.  The laparoscope was advanced and inspection undertaken. There was no evidence of trauma from the trocar insertion.  The liver was normal in appearance.  The gallbladder was not initially seen.      Eight millimeter Working ports were placed, 10 mm lateral to the  umbilicus on each side and on the right anterior axillary line.  The patient was placed in reverse trendelenberg position and rotated to the left.  The [employer] operating system was brought into the field and docked. Working instruments were advanced and the fundus of the gallbladder was grasped and elevated.  The neck was grabbed, placed on stretch and the triangle of Calot opened. Firefly vision was used to visualize the cystic duct and biliary anatomy.  The neck of the gallbladder was dissected with blunt and electorcautery to visualize the cystic duct.  This was cleared of  surrounding tissue. Adjacent to this the cystic artery was identified and also cleared of surrounding tissue.  The triangle of Calot was completely dissected and an excellent critical view of safety obtained.      The cystic duct was clipped proximally and distally with hemoclips and divided.  The cystic artery was divided in a similar fashion with cautery.  The gallbladder was then released from the undersurface of the liver using cautery.  Once free, it was placed it in an Endocatch retrieval bag and removed through the umbilical incision without problem.  The gallbladder was then sent to pathology.    The gallbladder fossa was without bleeding.  The cystic duct and cystic artery stumps were well seen with clip present and no evidence of bleeding or bile leak.    The working ports were removed under vision with no bleeding noted.  The pneumoperitoneum was released and the umbilical port removed.      Fascia at the umbilicus was reapproximated with 0 Vicryl suture.  Each port site was injected with 10 mL Experel.  Skin incisions were closed with interrupted 4-0 Monocryl subcuticular suture followed by Dermabond dressing.     Sponge, needle, instrument count correct on 2 occasions.    Estimated intraoperative blood loss, minimal.    Janet Bentley tolerated her surgery well and she was taken to PACU in satisfactory  condition.        ________________________________  Gilbert Narain, DO          Electronically signed by Sharyne Free, DO on 06/04/2024 at 3:17 PM

## 2024-06-04 NOTE — Addendum Note (Signed)
 Addendum  created 06/04/24 1533 by Cheryl Stabenow, Lum PARAS, APRN - CRNA    Intraprocedure Meds edited, Orders acknowledged in Narrator

## 2024-06-04 NOTE — Progress Notes (Signed)
 CLINICAL PHARMACY NOTE: MEDS TO BEDS    Total # of Prescriptions Filled: 1   The following medications were delivered to the patient:  Current Discharge Medication List        START taking these medications    Details   oxyCODONE-acetaminophen  (PERCOCET) 5-325 MG per tablet Take 1 tablet by mouth every 6 hours as needed for Pain for up to 3 days. Intended supply: 3 days. Take lowest dose possible to manage pain Max Daily Amount: 4 tablets  Qty: 12 tablet, Refills: 0    Comments: Reduce doses taken as pain becomes manageable  Associated Diagnoses: Biliary dyskinesia               Additional Documentation:    Patient family picked up RX curbside at pharmacy. No copay.

## 2024-06-04 NOTE — Interval H&P Note (Signed)
 Update History & Physical    The patient's History and Physical of May 22, 2024 was reviewed with the patient and I examined the patient. There was no change. The surgical site was confirmed by the patient and me.     Plan: The risks, benefits, expected outcome, and alternative to the recommended procedure have been discussed with the patient. Patient understands and wants to proceed with the procedure.     Electronically signed by Sharyne Free, DO on 06/04/2024 at 2:04 PM

## 2024-06-04 NOTE — Anesthesia Pre-Procedure Evaluation (Signed)
 Department of Anesthesiology  Preprocedure Note       Name:  Janet Bentley   Age:  33 y.o.  DOB:  [birth year]                                          MRN:  [id number]         Date:  06/04/2024      Surgeon: Clotilde):  Verma Sharyne LABOR, DO    Procedure: Procedure(s):  ROBOTIC ASSISTED LAPAROSCOPIC CHOLECYSTECTOMY    Medications prior to admission:   Prior to Admission medications   Medication Sig Start Date End Date Taking? Authorizing Provider   metoprolol  tartrate (LOPRESSOR ) 50 MG tablet TAKE 1 TABLET BY MOUTH TWICE A DAY  Patient taking differently: Take 0.5 tablets by mouth 2 times daily 07/21/23   Noretta Rock HERO, APRN - NP   metFORMIN  (GLUCOPHAGE ) 500 MG tablet Take 1 tablet by mouth daily (with breakfast)    [provider]   Loratadine (CLARITIN PO) Take 1 tablet by mouth as needed (allergies)    [provider]       Current medications:    Current Facility-Administered Medications   Medication Dose Route Frequency Provider Last Rate Last Admin   . acetaminophen  (TYLENOL ) tablet 1,000 mg  1,000 mg Oral Once Higdon, Alice A, DO       . celecoxib (CELEBREX) capsule 200 mg  200 mg Oral Once Higdon, Alice A, DO       . dexAMETHasone  (PF) (DECADRON ) IntraVENous 8 mg  8 mg IntraVENous Once Higdon, Alice A, DO       . lactated ringers  infusion   IntraVENous Continuous Higdon, Alice A, DO       . sodium chloride  flush 0.9 % injection 5-40 mL  5-40 mL IntraVENous 2 times per day Higdon, Alice A, DO       . sodium chloride  flush 0.9 % injection 5-40 mL  5-40 mL IntraVENous PRN Higdon, Alice A, DO       . 0.9 % sodium chloride  infusion   IntraVENous PRN Higdon, Alice A, DO       . ceFAZolin (ANCEF) 2,000 mg in sterile water 20 mL IV syringe  2,000 mg IntraVENous On Call to OR Higdon, Alice A, DO           Allergies:  No Known Allergies    Problem List:    Patient Active Problem List   Diagnosis Code   . [redacted] weeks gestation of pregnancy Z3A.32   . Ectopic pregnancy without intrauterine pregnancy O00.90   .  Paroxysmal atrial fibrillation (HCC) I48.0   . Paroxysmal A-fib (HCC) I48.0   . Biliary dyskinesia K82.8       Past Medical History:        Diagnosis Date   . A-fib (HCC)    . Atrial fibrillation (HCC)    . Insulin resistance    . Mental disorder     anxiety/depression       Past Surgical History:        Procedure Laterality Date   . APPENDECTOMY     . EP DEVICE PROCEDURE N/A 04/27/2023    Ablation A-fib w complete ep study performed by Trudy Debby LABOR, MD at [facility] CARDIAC CATH LAB   . EP DEVICE PROCEDURE N/A 04/27/2023    Ep cardioversion performed by Trudy Debby LABOR, MD at [facility]  CARDIAC CATH LAB   . HYSTERECTOMY (CERVIX STATUS UNKNOWN)      Partial - tubes and 1 ovary removed       Social History:    Social History     Tobacco Use   . Smoking status: Former     Current packs/day: 0.00     Types: Cigarettes     Quit date: 12/07/2020     Years since quitting: 3.4   . Smokeless tobacco: Never   Substance Use Topics   . Alcohol use: Never                                Counseling given: Not Answered      Vital Signs (Current): There were no vitals filed for this visit.                                           BP Readings from Last 3 Encounters:   12/13/23 122/72   11/22/23 118/80   05/12/23 124/72       NPO Status:                                                                                 BMI:   Wt Readings from Last 3 Encounters:   05/25/24 86.2 kg (190 lb)   05/22/24 87.5 kg (193 lb)   12/13/23 88 kg (194 lb)     There is no height or weight on file to calculate BMI.    CBC:   Lab Results   Component Value Date/Time    WBC 5.9 04/19/2023 09:13 AM    RBC 4.22 04/19/2023 09:13 AM    HGB 13.2 04/19/2023 09:13 AM    HCT 38.5 04/19/2023 09:13 AM    MCV 91.2 04/19/2023 09:13 AM    RDW 11.9 04/19/2023 09:13 AM    PLT 240 04/19/2023 09:13 AM       CMP:   Lab Results   Component Value Date/Time    NA 138 04/19/2023 09:13 AM    K 4.2 04/19/2023 09:13 AM    CL 102 04/19/2023 09:13 AM    CO2 24 04/19/2023 09:13 AM    BUN  16 04/19/2023 09:13 AM    CREATININE 0.6 04/19/2023 09:13 AM    GFRAA >59 01/14/2021 12:04 PM    LABGLOM >90 04/19/2023 09:13 AM    LABGLOM >60 09/03/2022 12:15 AM    GLUCOSE 104 04/19/2023 09:13 AM    CALCIUM 9.1 04/19/2023 09:13 AM    BILITOT <0.2 09/03/2022 12:15 AM    ALKPHOS 52 09/03/2022 12:15 AM    AST 17 09/03/2022 12:15 AM    ALT 17 09/03/2022 12:15 AM       POC Tests: No results for input(s): POCGLU, POCNA, POCK, POCCL, POCBUN, POCHEMO, POCHCT in the last 72 hours.    Coags:   Lab Results   Component Value Date/Time    APTT 29.0 06/02/2022 10:59 AM       HCG (If Applicable):   Lab Results  Component Value Date    PREGTESTUR Negative 04/27/2023    PREGSERUM Negative 09/03/2022    HCGQUANT 4.6 02/09/2021        ABGs: No results found for: PHART, PO2ART, PCO2ART, HCO3ART, BEART, O2SATART     Type & Screen (If Applicable):  Lab Results   Component Value Date    ABORH O POS 01/06/2021    LABANTI NEG 01/06/2021       Drug/Infectious Status (If Applicable):  Lab Results   Component Value Date/Time    HIV Non-reactive 10/03/2018 10:51 AM       COVID-19 Screening (If Applicable):   Lab Results   Component Value Date/Time    COVID19 Not Detected 06/02/2022 11:50 AM           Anesthesia Evaluation    Patient summary reviewed     no history of anesthetic complications:  Airway:  Mallampati: II  TM distance: >3 FB   Neck ROM: full    Mouth opening: > = 3 FB   Dental:           Pulmonary:   normal exam   breath sounds clear to auscultation      (-) asthma, recent URI, sleep apnea and not a current smoker    Patient did not smoke on day of surgery.     Cardiovascular:     Exercise tolerance: good (>4 METS)    (+)             dysrhythmias (No issues since ablation): atrial fibrillation                                (-) pacemaker, hypertension, past MI and  angina    ECG reviewed  Rhythm: regular  Rate: normal         Beta Blocker:  Dose within 24 Hrs       Neuro/Psych:       (-) seizures, TIA  and CVA          GI/Hepatic/Renal:         (-) GERD, liver disease and no renal disease     Endo/Other:         (-) diabetes mellitus               Abdominal:              Vascular:      - DVT.    Other Findings:              Anesthesia Plan      general     ASA 3     (Preop famotidine)  Induction: intravenous.    MIPS: Postoperative opioids intended and Prophylactic antiemetics administered.  Anesthetic plan and risks discussed with patient.    Use of blood products discussed with patient whom consented to blood products.                    Fonda JAYSON Rung, MD   06/04/2024

## 2024-06-04 NOTE — Anesthesia Post-Procedure Evaluation (Signed)
 Department of Anesthesiology  Postprocedure Note    Patient: Janet Bentley  MRN: [id number]  Birthdate: 1991-07-14  Date of evaluation: 06/04/2024    Procedure Summary       Date: 06/04/24 Room / Location: [facility] OR 10 / [facility]    Anesthesia Start: 1421 Anesthesia Stop: 1530    Procedure: ROBOTIC ASSISTED LAPAROSCOPIC CHOLECYSTECTOMY (Abdomen) Diagnosis:       Biliary dyskinesia      (Biliary dyskinesia [K82.8])    Surgeons: Verma Sharyne LABOR, DO Responsible Provider: Trent Lum PARAS, APRN - CRNA    Anesthesia Type: general ASA Status: 3            Anesthesia Type: No value filed.    Aldrete Phase I: Aldrete Score: 9    Aldrete Phase II:      Anesthesia Post Evaluation    Patient location during evaluation: PACU  Patient participation: complete - patient participated  Level of consciousness: awake  Airway patency: patent  Nausea & Vomiting: no nausea  Cardiovascular status: hemodynamically stable  Respiratory status: acceptable  Hydration status: stable  Comments: BP 136/60   Pulse 97   Temp 98.4 F (36.9 C)   Resp 15    SpO2 100%     Pain management: adequate      No notable events documented.

## 2024-06-19 ENCOUNTER — Ambulatory Visit: Admit: 2024-06-19 | Discharge: 2024-06-19 | Payer: Medicaid (Managed Care) | Attending: Surgery | Primary: Family

## 2024-06-19 VITALS — HR 56 | Temp 97.90000°F | Ht 68.0 in | Wt 192.0 lb

## 2024-06-19 DIAGNOSIS — Z09 Encounter for follow-up examination after completed treatment for conditions other than malignant neoplasm: Principal | ICD-10-CM

## 2024-06-19 NOTE — Progress Notes (Signed)
"  Postop Progress Note    Subjective    Janet Bentley presents to the office for postop follow up two weeks s/p robotic cholecystectomy. Doing well. No issues with food.     Objective    Vitals:    06/19/24 1031   Pulse: 56   Temp: 97.9 F (36.6 C)   SpO2: 99%     General: alert, cooperative and no distress  Incision: healing well    Assessment  Doing well postoperatively.    Plan  1. Continue any current medications  2. Wound care discussed  3. Pt is to increase activities as tolerated  4. Usual diet  5. Follow up: as needed    Electronically signed by Sharyne Free, DO on 06/19/2024 at 10:37 AM  "

## 2024-08-27 MED ORDER — METOPROLOL TARTRATE 50 MG PO TABS
50 | ORAL_TABLET | Freq: Two times a day (BID) | ORAL | 5 refills | 90.00000 days | Status: AC
Start: 2024-08-27 — End: ?
